# Patient Record
Sex: Female | Born: 1955 | Race: Black or African American | Hispanic: No | Marital: Single | Smoking: Current every day smoker
Health system: Southern US, Community
[De-identification: ages and names within clinical notes are randomized; demographics above are authoritative.]

## PROBLEM LIST (undated history)

## (undated) DIAGNOSIS — I10 Essential (primary) hypertension: Secondary | ICD-10-CM

## (undated) DIAGNOSIS — F419 Anxiety disorder, unspecified: Secondary | ICD-10-CM

## (undated) DIAGNOSIS — E785 Hyperlipidemia, unspecified: Secondary | ICD-10-CM

## (undated) DIAGNOSIS — R569 Unspecified convulsions: Secondary | ICD-10-CM

## (undated) DIAGNOSIS — K219 Gastro-esophageal reflux disease without esophagitis: Secondary | ICD-10-CM

## (undated) DIAGNOSIS — Z972 Presence of dental prosthetic device (complete) (partial): Secondary | ICD-10-CM

## (undated) HISTORY — DX: Hyperlipidemia, unspecified: E78.5

## (undated) HISTORY — PX: TUBAL LIGATION: SHX77

## (undated) HISTORY — PX: TOOTH EXTRACTION: SUR596

---

## 1999-05-23 LAB — HM PAP SMEAR

## 2003-11-10 HISTORY — PX: ABDOMINAL HYSTERECTOMY: SHX81

## 2005-07-31 ENCOUNTER — Emergency Department: Payer: Self-pay | Admitting: Emergency Medicine

## 2006-03-04 ENCOUNTER — Emergency Department: Payer: Self-pay | Admitting: Emergency Medicine

## 2006-03-24 ENCOUNTER — Other Ambulatory Visit: Payer: Self-pay

## 2006-03-29 ENCOUNTER — Ambulatory Visit: Payer: Self-pay | Admitting: Obstetrics and Gynecology

## 2007-07-26 LAB — HM MAMMOGRAPHY

## 2008-07-25 ENCOUNTER — Other Ambulatory Visit: Payer: Self-pay

## 2008-07-25 ENCOUNTER — Emergency Department: Payer: Self-pay | Admitting: Emergency Medicine

## 2009-05-02 ENCOUNTER — Ambulatory Visit: Payer: Self-pay | Admitting: Family Medicine

## 2010-07-10 HISTORY — PX: LAMINECTOMY: SHX219

## 2010-08-07 ENCOUNTER — Ambulatory Visit (HOSPITAL_COMMUNITY)
Admission: RE | Admit: 2010-08-07 | Discharge: 2010-08-09 | Payer: Self-pay | Source: Home / Self Care | Admitting: Orthopedic Surgery

## 2010-08-07 LAB — HM HEPATITIS C SCREENING LAB: HM HEPATITIS C SCREENING: NEGATIVE

## 2010-08-07 LAB — HM HIV SCREENING LAB: HM HIV Screening: NEGATIVE

## 2011-01-22 LAB — BASIC METABOLIC PANEL WITH GFR
BUN: 7 mg/dL (ref 6–23)
CO2: 29 meq/L (ref 19–32)
Calcium: 9.7 mg/dL (ref 8.4–10.5)
Chloride: 105 meq/L (ref 96–112)
Creatinine, Ser: 0.85 mg/dL (ref 0.4–1.2)
GFR calc non Af Amer: >60 mL/min
Glucose, Bld: 100 mg/dL — ABNORMAL HIGH (ref 70–99)
Potassium: 3.9 meq/L (ref 3.5–5.1)
Sodium: 140 meq/L (ref 135–145)

## 2011-01-22 LAB — CBC
HCT: 38.9 % (ref 36.0–46.0)
Hemoglobin: 12.7 g/dL (ref 12.0–15.0)
MCHC: 32.6 g/dL (ref 30.0–36.0)
RBC: 4.15 MIL/uL (ref 3.87–5.11)
WBC: 5.2 10*3/uL (ref 4.0–10.5)

## 2011-01-22 LAB — SURGICAL PCR SCREEN: Staphylococcus aureus: NEGATIVE

## 2011-01-22 LAB — HEPATIC FUNCTION PANEL
AST: 37 U/L (ref 0–37)
Albumin: 3.9 g/dL (ref 3.5–5.2)
Alkaline Phosphatase: 71 U/L (ref 39–117)
Total Bilirubin: 0.9 mg/dL (ref 0.3–1.2)
Total Protein: 6.9 g/dL (ref 6.0–8.3)

## 2011-04-30 LAB — CBC AND DIFFERENTIAL
HEMATOCRIT: 40 % (ref 36–46)
HEMOGLOBIN: 13.1 g/dL (ref 12.0–16.0)
NEUTROS ABS: 2 /uL
PLATELETS: 313 10*3/uL (ref 150–399)
WBC: 5 10*3/mL

## 2011-04-30 LAB — HEPATIC FUNCTION PANEL
ALK PHOS: 89 U/L (ref 25–125)
ALT: 27 U/L (ref 7–35)
AST: 33 U/L (ref 13–35)
Bilirubin, Total: 0.3 mg/dL

## 2011-04-30 LAB — BASIC METABOLIC PANEL
BUN: 9 mg/dL (ref 4–21)
Creatinine: 1 mg/dL (ref 0.5–1.1)
Glucose: 88 mg/dL
POTASSIUM: 4 mmol/L (ref 3.4–5.3)
Sodium: 139 mmol/L (ref 137–147)

## 2011-04-30 LAB — LIPID PANEL
CHOLESTEROL: 191 mg/dL (ref 0–200)
HDL: 63 mg/dL (ref 35–70)
LDL Cholesterol: 102 mg/dL
LDl/HDL Ratio: 1.6
TRIGLYCERIDES: 132 mg/dL (ref 40–160)

## 2011-04-30 LAB — TSH: TSH: 3.49 u[IU]/mL (ref 0.41–5.90)

## 2012-03-25 ENCOUNTER — Emergency Department: Payer: Self-pay | Admitting: *Deleted

## 2015-07-18 ENCOUNTER — Other Ambulatory Visit: Payer: Self-pay | Admitting: Family Medicine

## 2015-07-18 DIAGNOSIS — I10 Essential (primary) hypertension: Secondary | ICD-10-CM

## 2015-07-19 NOTE — Telephone Encounter (Signed)
Last Office Visit was on 10/31/2011   Her atenolol was refilled on 12/10/2014. Do you want her to get an appointment to f/u?      Thanks,

## 2015-08-15 ENCOUNTER — Other Ambulatory Visit: Payer: Self-pay | Admitting: Family Medicine

## 2015-08-15 DIAGNOSIS — I10 Essential (primary) hypertension: Secondary | ICD-10-CM

## 2015-08-23 ENCOUNTER — Other Ambulatory Visit: Payer: Self-pay | Admitting: Family Medicine

## 2015-08-23 DIAGNOSIS — I10 Essential (primary) hypertension: Secondary | ICD-10-CM

## 2015-08-23 MED ORDER — TRIAMTERENE-HCTZ 37.5-25 MG PO TABS: 1.0000 | ORAL_TABLET | Freq: Every day | ORAL | Status: DC

## 2015-08-23 NOTE — Telephone Encounter (Signed)
Pt stating she is out of her medication triamterene-hydrochlorothiazide (MAXZIDE-25) 37.5-25 MG per tablet  @ [facility] on [address].Pt has an office visit on Monday for BP Check. CC

## 2015-08-26 ENCOUNTER — Ambulatory Visit: Payer: Self-pay | Admitting: Family Medicine

## 2015-09-02 ENCOUNTER — Ambulatory Visit (INDEPENDENT_AMBULATORY_CARE_PROVIDER_SITE_OTHER): Payer: PPO | Admitting: Family Medicine

## 2015-09-02 ENCOUNTER — Encounter: Payer: Self-pay | Admitting: Emergency Medicine

## 2015-09-02 ENCOUNTER — Encounter: Payer: Self-pay | Admitting: Family Medicine

## 2015-09-02 VITALS — BP 124/82 | HR 70 | Temp 97.8°F | Resp 16 | Wt 218.0 lb

## 2015-09-02 DIAGNOSIS — I10 Essential (primary) hypertension: Secondary | ICD-10-CM | POA: Diagnosis not present

## 2015-09-02 DIAGNOSIS — Z720 Tobacco use: Secondary | ICD-10-CM | POA: Insufficient documentation

## 2015-09-02 DIAGNOSIS — F419 Anxiety disorder, unspecified: Secondary | ICD-10-CM

## 2015-09-02 DIAGNOSIS — G509 Disorder of trigeminal nerve, unspecified: Secondary | ICD-10-CM | POA: Insufficient documentation

## 2015-09-02 DIAGNOSIS — F17200 Nicotine dependence, unspecified, uncomplicated: Secondary | ICD-10-CM

## 2015-09-02 DIAGNOSIS — J309 Allergic rhinitis, unspecified: Secondary | ICD-10-CM | POA: Insufficient documentation

## 2015-09-02 DIAGNOSIS — K219 Gastro-esophageal reflux disease without esophagitis: Secondary | ICD-10-CM | POA: Insufficient documentation

## 2015-09-02 DIAGNOSIS — F1911 Other psychoactive substance abuse, in remission: Secondary | ICD-10-CM | POA: Insufficient documentation

## 2015-09-02 MED ORDER — TRIAMTERENE-HCTZ 75-50 MG PO TABS
1.0000 | ORAL_TABLET | Freq: Every day | ORAL | Status: DC
Start: 2015-09-02 — End: 2016-02-24

## 2015-09-02 MED ORDER — ESCITALOPRAM OXALATE 10 MG PO TABS: 10.0000 mg | ORAL_TABLET | Freq: Every day | ORAL | Status: DC

## 2015-09-02 NOTE — Progress Notes (Signed)
Patient ID: Elizabeth Hogan, female   DOB: 08-29-1956, 59 y.o.   MRN: [id number]       Patient: Elizabeth Hogan Female    DOB: Jan 08, 1956   59 y.o.   MRN: [id number] Visit Date: 09/02/2015  Today's Provider: Margarita Rana, MD   Chief Complaint  Patient presents with  . Hypertension   Subjective:    HPI   Hypertension, follow-up:  BP Readings from Last 3 Encounters:  09/02/15 124/82    She was last seen for hypertension years ago.  BP at that visit was 130/82. Management since that visit includes none. She reports fair compliance with treatment. She is not having side effects.  She is exercising sometimes.   Outside blood pressures are being checked every 3 months when she goes to her " back doctor". Was up a few times when she did not take her pain medication, but usually it is ok.   Patient denies chest pain, chest pressure/discomfort, dyspnea, fatigue and palpitations.   Cardiovascular risk factors include hypertension and obesity (BMI >= 30 kg/m2).   Wt Readings from Last 3 Encounters:  09/02/15 218 lb (98.884 kg)    ------------------------------------------------------------------------ Pt reports that since she is not working and she thinks that is contributing to her feeling down most days. Having anxiety attacks. Has been going on a while.  Has not been treated for this before. See screening below.  Does smoke. Stays with her grand-daughter and her 3 kids. Would like something for anxiety. Has not been on medication for this in the past.     Also thinks the fluid pills are not doing enough. Diet has gone crazy. More salt than she should.    Depression screen [facility] 2/9 09/02/2015 09/02/2015  Decreased Interest 2 -  Down, Depressed, Hopeless 3 3  PHQ - 2 Score 5 3  Altered sleeping 1 -  Tired, decreased energy 0 -  Change in appetite 2 -  Feeling bad or failure about yourself  0 -  Trouble concentrating 0 -  Moving slowly or fidgety/restless 1 -  Suicidal  thoughts 0 -  PHQ-9 Score 9 -         Allergies  Allergen Reactions  . Codeine Sulfate Itching and Rash   Previous Medications   ATENOLOL (TENORMIN) 25 MG TABLET    TAKE ONE TABLET BY MOUTH ONCE DAILY   CELECOXIB (CELEBREX) 100 MG CAPSULE    Take by mouth.   GABAPENTIN (NEURONTIN) 300 MG CAPSULE    Take by mouth.   NORTRIPTYLINE (PAMELOR) 50 MG CAPSULE    Take by mouth.   OMEPRAZOLE POWD    Use.   RIZATRIPTAN (MAXALT) 5 MG TABLET    Take by mouth.   TRIAMTERENE-HYDROCHLOROTHIAZIDE (MAXZIDE-25) 37.5-25 MG TABLET    Take 1 tablet by mouth daily.    Review of Systems  Constitutional: Negative.   HENT: Negative.   Eyes: Negative.   Respiratory: Negative.   Cardiovascular: Negative.   Gastrointestinal: Negative.   Endocrine: Negative.   Genitourinary: Negative.   Musculoskeletal: Negative.   Skin: Negative.   Allergic/Immunologic: Negative.   Hematological: Negative.   Psychiatric/Behavioral: The patient is nervous/anxious.     Social History  Substance Use Topics  . Smoking status: Former Smoker -- 0.25 packs/day for 30 years  . Smokeless tobacco: Not on file  . Alcohol Use: Yes     Comment: 2-3 beers 2-3 times a week.   Objective:   BP 124/82 mmHg  Pulse 70  Temp(Src) 97.8 F (36.6 C) (Oral)  Resp 16  Wt 218 lb (98.884 kg)  Physical Exam  Constitutional: She is oriented to person, place, and time. She appears well-developed and well-nourished.  Cardiovascular: Normal rate and regular rhythm.   Pulmonary/Chest: Effort normal and breath sounds normal. No respiratory distress.  Neurological: She is alert and oriented to person, place, and time.  Psychiatric: She has a normal mood and affect. Her behavior is normal. Judgment and thought content normal.      Assessment & Plan:     1. Essential (primary) hypertension Stable, but is complaining of fluid retention. Will increase fluid pill and decrease salt intake and check labs.  - CBC w/Diff/Platelet -  Comprehensive metabolic panel - triamterene-hydrochlorothiazide (MAXZIDE) 75-50 MG tablet; Take 1 tablet by mouth daily.  Dispense: 30 tablet; Refill: 5  2. Anxiety New problem. Will check labs and start medication.   - escitalopram (LEXAPRO) 10 MG tablet; Take 1 tablet (10 mg total) by mouth daily.  Dispense: 30 tablet; Refill: 5 - TSH  3. Tobacco use disorder Stressed importance of quitting.  Margarita Rana, MD        Margarita Rana, MD  [facility]

## 2015-10-07 ENCOUNTER — Encounter: Payer: Self-pay | Admitting: Family Medicine

## 2015-10-07 ENCOUNTER — Ambulatory Visit (INDEPENDENT_AMBULATORY_CARE_PROVIDER_SITE_OTHER): Payer: PPO | Admitting: Family Medicine

## 2015-10-07 VITALS — BP 160/98 | HR 80 | Temp 98.1°F | Resp 16 | Wt 221.0 lb

## 2015-10-07 DIAGNOSIS — I10 Essential (primary) hypertension: Secondary | ICD-10-CM

## 2015-10-07 DIAGNOSIS — F419 Anxiety disorder, unspecified: Secondary | ICD-10-CM | POA: Diagnosis not present

## 2015-10-07 DIAGNOSIS — F17200 Nicotine dependence, unspecified, uncomplicated: Secondary | ICD-10-CM

## 2015-10-07 MED ORDER — AMLODIPINE BESYLATE 5 MG PO TABS: 5.0000 mg | ORAL_TABLET | Freq: Every day | ORAL | Status: DC

## 2015-10-07 MED ORDER — SERTRALINE HCL 50 MG PO TABS: 50.0000 mg | ORAL_TABLET | Freq: Every day | ORAL | Status: DC

## 2015-10-07 NOTE — Progress Notes (Signed)
Subjective:    Patient ID: Elizabeth Hogan, female    DOB: Mar 22, 1956, 59 y.o.   MRN: [id number]  Anxiety Presents for follow-up (started Lexapro at [place].) visit. Symptoms include decreased concentration, depressed mood, dizziness ("off-balance"), dry mouth, irritability, nervous/anxious behavior and palpitations. Patient reports no chest pain, compulsions, confusion ("I fell kind of off"), excessive worry, feeling of choking, hyperventilation, insomnia, muscle tension, nausea, panic, restlessness, shortness of breath or suicidal ideas. Symptoms occur most days.   Past treatments include SSRIs. The treatment provided moderate (there are certain side-effects to the Lexapro that pot is experiencing) relief. Compliance with prior treatments has been good. Side effects of treatment include headaches and visual problems.  Hypertension This is a chronic problem. Episode onset: FU- increased Maxzide at [facility] for swelling. The problem is uncontrolled. Associated symptoms include anxiety and palpitations. Pertinent negatives include no chest pain or shortness of breath. Treatments tried: Atenolol, Maxzide. The current treatment provides mild (Headache is making her head hurt. ) improvement. There are no compliance problems.    Could not have lab work done.  Has an old worker's comp issue.     Review of Systems  Constitutional: Positive for irritability.  Respiratory: Negative for shortness of breath.   Cardiovascular: Positive for palpitations. Negative for chest pain.  Gastrointestinal: Negative for nausea.  Neurological: Positive for dizziness ("off-balance").  Psychiatric/Behavioral: Positive for decreased concentration. Negative for suicidal ideas and confusion ("I fell kind of off"). The patient is nervous/anxious. The patient does not have insomnia.    BP 160/98 mmHg  Pulse 80  Temp(Src) 98.1 F (36.7 C) (Oral)  Resp 16  Wt 221 lb (100.245 kg)   Patient Active Problem List   Diagnosis Date  Noted  . Allergic rhinitis 09/02/2015  . Acid reflux 09/02/2015  . 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 Tobacco use disorder 09/02/2015  . 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 Anxiety 09/02/2015  . Essential (primary) hypertension 08/04/2005   No past medical history on file. Current Outpatient Prescriptions on File Prior to Visit  Medication Sig  . atenolol (TENORMIN) 25 MG tablet TAKE ONE TABLET BY MOUTH ONCE DAILY  . celecoxib (CELEBREX) 100 MG capsule Take by mouth.  . escitalopram (LEXAPRO) 10 MG tablet Take 1 tablet (10 mg total) by mouth daily.  Marland Kitchen gabapentin (NEURONTIN) 300 MG capsule Take by mouth.  . nortriptyline (PAMELOR) 50 MG capsule Take by mouth.  . Omeprazole POWD Use.  . rizatriptan (MAXALT) 5 MG tablet Take by mouth.  . triamterene-hydrochlorothiazide (MAXZIDE) 75-50 MG tablet Take 1 tablet by mouth daily.   No current facility-administered medications on file prior to visit.   Allergies  Allergen Reactions  . Codeine Sulfate Itching and Rash   Past Surgical History  Procedure Laterality Date  . Abdominal hysterectomy  2005  . Laminectomy  07/2010    Secondary to Job injury in 2010, Subsequent nerve damage and followed by [place] History   Social History  . Marital Status: Single    Spouse Name: N/A  . Number of Children: N/A  . Years of Education: N/A   Occupational History  . Not on file.   Social History Main Topics  . Smoking status: Former Smoker -- 0.25 packs/day for 30 years  . Smokeless tobacco: Not on file  . Alcohol Use: Yes     Comment: 2-3 beers 2-3 times a week.  . Drug Use: Yes    Special: Cocaine  Comment: Used cocaine in the past and been incarcerated for this.  . Sexual Activity: Not on file   Other Topics Concern  . Not on file   Social History Narrative   Family History  Problem Relation Age of Onset  . Hypertension Mother   . Arthritis Mother   . Diabetes Father   . Alzheimer's  disease Father   . Hypertension Father   . Hypertension Brother       Objective:   Physical Exam  Constitutional: She is oriented to person, place, and time. She appears well-developed and well-nourished.  Cardiovascular: Normal rate and regular rhythm.   Pulmonary/Chest: Effort normal and breath sounds normal.  Neurological: She is alert and oriented to person, place, and time.  Psychiatric: She has a normal mood and affect. Her behavior is normal. Judgment and thought content normal.   BP 160/98 mmHg  Pulse 80  Temp(Src) 98.1 F (36.7 C) (Oral)  Resp 16  Wt 221 lb (100.245 kg)      Assessment & Plan:  1. Essential (primary) hypertension Improved, but not at goal.  Will add medication and recheck at [facility].  - amLODipine (NORVASC) 5 MG tablet; Take 1 tablet (5 mg total) by mouth daily.  Dispense: 90 tablet; Refill: 3  2. Anxiety Will change to Zoloft.   Recheck at CPE.  Will call in 4 weeks and can make further adjustment over the phone.  - sertraline (ZOLOFT) 50 MG tablet; Take 1 tablet (50 mg total) by mouth daily. 1/2  For one week and then increase to one pill.  Dispense: 30 tablet; Refill: 3  3. Tobacco use disorder Still smoking, but does not do any other drugs at this time.    Margarita Rana, MD

## 2015-10-09 ENCOUNTER — Emergency Department
Admission: EM | Admit: 2015-10-09 | Discharge: 2015-10-09 | Disposition: A | Discharge status: Home / Self Care | Payer: PPO | Attending: Emergency Medicine | Admitting: Emergency Medicine

## 2015-10-09 ENCOUNTER — Encounter: Payer: Self-pay | Admitting: Emergency Medicine

## 2015-10-09 DIAGNOSIS — F17210 Nicotine dependence, cigarettes, uncomplicated: Secondary | ICD-10-CM | POA: Insufficient documentation

## 2015-10-09 DIAGNOSIS — I10 Essential (primary) hypertension: Secondary | ICD-10-CM | POA: Diagnosis not present

## 2015-10-09 DIAGNOSIS — R519 Headache, unspecified: Secondary | ICD-10-CM

## 2015-10-09 DIAGNOSIS — R51 Headache: Secondary | ICD-10-CM | POA: Diagnosis present

## 2015-10-09 DIAGNOSIS — F419 Anxiety disorder, unspecified: Secondary | ICD-10-CM | POA: Insufficient documentation

## 2015-10-09 HISTORY — DX: Anxiety disorder, unspecified: F41.9

## 2015-10-09 HISTORY — DX: Essential (primary) hypertension: I10

## 2015-10-09 MED ORDER — OXYCODONE-ACETAMINOPHEN 5-325 MG PO TABS
2.0000 | ORAL_TABLET | Freq: Once | ORAL | Status: AC
  Administered 2015-10-09: 2 via ORAL
  Filled 2015-10-09: qty 2

## 2015-10-09 MED ORDER — LORAZEPAM 1 MG PO TABS: 1.0000 mg | ORAL_TABLET | Freq: Two times a day (BID) | ORAL | Status: AC

## 2015-10-09 NOTE — Discharge Instructions (Signed)
General Headache Without Cause °A headache is pain or discomfort felt around the head or neck area. The specific cause of a headache may not be found. There are many causes and types of headaches. A few common ones are: °· Tension headaches. °· Migraine headaches. °· Cluster headaches. °· Chronic daily headaches. °HOME CARE INSTRUCTIONS  °Watch your condition for any changes. Take these steps to help with your condition: °Managing Pain °· Take over-the-counter and prescription medicines only as told by your health care provider. °· Lie down in a dark, quiet room when you have a headache. °· If directed, apply ice to the head and neck area: °· Put ice in a plastic bag. °· Place a towel between your skin and the bag. °· Leave the ice on for 20 minutes, 2-3 times per day. °· Use a heating pad or hot shower to apply heat to the head and neck area as told by your health care provider. °· Keep lights dim if bright lights bother you or make your headaches worse. °Eating and Drinking °· Eat meals on a regular schedule. °· Limit alcohol use. °· Decrease the amount of caffeine you drink, or stop drinking caffeine. °General Instructions °· Keep all follow-up visits as told by your health care provider. This is important. °· Keep a headache journal to help find out what may trigger your headaches. For example, write down: °· What you eat and drink. °· How much sleep you get. °· Any change to your diet or medicines. °· Try massage or other relaxation techniques. °· Limit stress. °· Sit up straight, and do not tense your muscles. °· Do not use tobacco products, including cigarettes, chewing tobacco, or e-cigarettes. If you need help quitting, ask your health care provider. °· Exercise regularly as told by your health care provider. °· Sleep on a regular schedule. Get 7-9 hours of sleep, or the amount recommended by your health care provider. °SEEK MEDICAL CARE IF:  °· Your symptoms are not helped by medicine. °· You have a  headache that is different from the usual headache. °· You have nausea or you vomit. °· You have a fever. °SEEK IMMEDIATE MEDICAL CARE IF:  °· Your headache becomes severe. °· You have repeated vomiting. °· You have a stiff neck. °· You have a loss of vision. °· You have problems with speech. °· You have pain in the eye or ear. °· You have muscular weakness or loss of muscle control. °· You lose your balance or have trouble walking. °· You feel faint or pass out. °· You have confusion. °  °This information is not intended to replace advice given to you by your health care provider. Make sure you discuss any questions you have with your health care provider. °  °Document Released: 10/26/2005 Document Revised: 07/17/2015 Document Reviewed: 02/18/2015 °Elsevier Interactive Patient Education ©2016 Elsevier Inc. ° °Hypertension °Hypertension, commonly called high blood pressure, is when the force of blood pumping through your arteries is too strong. Your arteries are the blood vessels that carry blood from your heart throughout your body. A blood pressure reading consists of a higher number over a lower number, such as 110/72. The higher number (systolic) is the pressure inside your arteries when your heart pumps. The lower number (diastolic) is the pressure inside your arteries when your heart relaxes. Ideally you want your blood pressure below 120/80. °Hypertension forces your heart to work harder to pump blood. Your arteries may become narrow or stiff. Having untreated or   uncontrolled hypertension can cause heart attack, stroke, kidney disease, and other problems. °RISK FACTORS °Some risk factors for high blood pressure are controllable. Others are not.  °Risk factors you cannot control include:  °· Race. You may be at higher risk if you are African American. °· Age. Risk increases with age. °· Gender. Men are at higher risk than women before age 45 years. After age 65, women are at higher risk than men. °Risk factors  you can control include: °· Not getting enough exercise or physical activity. °· Being overweight. °· Getting too much fat, sugar, calories, or salt in your diet. °· Drinking too much alcohol. °SIGNS AND SYMPTOMS °Hypertension does not usually cause signs or symptoms. Extremely high blood pressure (hypertensive crisis) may cause headache, anxiety, shortness of breath, and nosebleed. °DIAGNOSIS °To check if you have hypertension, your health care provider will measure your blood pressure while you are seated, with your arm held at the level of your heart. It should be measured at least twice using the same arm. Certain conditions can cause a difference in blood pressure between your right and left arms. A blood pressure reading that is higher than normal on one occasion does not mean that you need treatment. If it is not clear whether you have high blood pressure, you may be asked to return on a different day to have your blood pressure checked again. Or, you may be asked to monitor your blood pressure at home for 1 or more weeks. °TREATMENT °Treating high blood pressure includes making lifestyle changes and possibly taking medicine. Living a healthy lifestyle can help lower high blood pressure. You may need to change some of your habits. °Lifestyle changes may include: °· Following the DASH diet. This diet is high in fruits, vegetables, and whole grains. It is low in salt, red meat, and added sugars. °· Keep your sodium intake below 2,300 mg per day. °· Getting at least 30-45 minutes of aerobic exercise at least 4 times per week. °· Losing weight if necessary. °· Not smoking. °· Limiting alcoholic beverages. °· Learning ways to reduce stress. °Your health care provider may prescribe medicine if lifestyle changes are not enough to get your blood pressure under control, and if one of the following is true: °· You are 18-59 years of age and your systolic blood pressure is above 140. °· You are 60 years of age or older,  and your systolic blood pressure is above 150. °· Your diastolic blood pressure is above 90. °· You have diabetes, and your systolic blood pressure is over 140 or your diastolic blood pressure is over 90. °· You have kidney disease and your blood pressure is above 140/90. °· You have heart disease and your blood pressure is above 140/90. °Your personal target blood pressure may vary depending on your medical conditions, your age, and other factors. °HOME CARE INSTRUCTIONS °· Have your blood pressure rechecked as directed by your health care provider.   °· Take medicines only as directed by your health care provider. Follow the directions carefully. Blood pressure medicines must be taken as prescribed. The medicine does not work as well when you skip doses. Skipping doses also puts you at risk for problems. °· Do not smoke.   °· Monitor your blood pressure at home as directed by your health care provider.  °SEEK MEDICAL CARE IF:  °· You think you are having a reaction to medicines taken. °· You have recurrent headaches or feel dizzy. °· You have swelling in your   ankles. °· You have trouble with your vision. °SEEK IMMEDIATE MEDICAL CARE IF: °· You develop a severe headache or confusion. °· You have unusual weakness, numbness, or feel faint. °· You have severe chest or abdominal pain. °· You vomit repeatedly. °· You have trouble breathing. °MAKE SURE YOU:  °· Understand these instructions. °· Will watch your condition. °· Will get help right away if you are not doing well or get worse. °  °This information is not intended to replace advice given to you by your health care provider. Make sure you discuss any questions you have with your health care provider. °  °Document Released: 10/26/2005 Document Revised: 03/12/2015 Document Reviewed: 08/18/2013 °Elsevier Interactive Patient Education ©2016 Elsevier Inc. ° °

## 2015-10-09 NOTE — ED Provider Notes (Signed)
[facility] Emergency Department Provider Note     Time seen: ----------------------------------------- 10:18 PM on 10/09/2015 -----------------------------------------    I have reviewed the triage vital signs and the nursing notes.   HISTORY  Chief Complaint Headache    HPI Elizabeth Hogan is a 59 y.o. female who presents ER for headache. Patient claims her pressure in the front of her head. Patient recently had her blood pressure medication increased her primary care doctor. She went to the pharmacy the pharmacist told her that a blood pressure of 140/95 to cause serious medical problems. Patient just complains of dental head pressure, denies fevers, chills, chest pain, shortness of breath, nausea vomiting or diarrhea. Patient also notes she has history of anxiety.   Past Medical History  Diagnosis Date  . Hypertension   . Anxiety     Patient Active Problem List   Diagnosis Date Noted  . Allergic rhinitis 09/02/2015  . Acid reflux 09/02/2015  . 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 Tobacco use disorder 09/02/2015  . 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 Anxiety 09/02/2015  . Essential (primary) hypertension 08/04/2005    Past Surgical History  Procedure Laterality Date  . Abdominal hysterectomy  2005  . Laminectomy  07/2010    Secondary to Job injury in 2010, Subsequent nerve damage and followed by [facility]    Allergies Codeine sulfate  Social History Social History  Substance Use Topics  . Smoking status: Current Every Day Smoker -- 0.50 packs/day for 30 years    Types: Cigarettes  . Smokeless tobacco: None  . Alcohol Use: Yes     Comment: 2-3 beers 2-3 times a week.    Review of Systems Constitutional: Negative for fever. Eyes: Negative for visual changes. ENT: Negative for sore throat. Cardiovascular: Negative for chest pain. Respiratory: Negative for shortness of breath. Gastrointestinal: Negative for  abdominal pain, vomiting and diarrhea. Genitourinary: Negative for dysuria. Musculoskeletal: Negative for back pain. Skin: Negative for rash. Neurological: Positive for headache, negative for weakness  10-point ROS otherwise negative.  ____________________________________________   PHYSICAL EXAM:  VITAL SIGNS: ED Triage Vitals  Enc Vitals Group     BP 10/09/15 2102 140/91 mmHg     Pulse Rate 10/09/15 2102 80     Resp 10/09/15 2102 20     Temp 10/09/15 2102 98 F (36.7 C)     Temp Source 10/09/15 2102 Oral     SpO2 10/09/15 2102 97 %     Weight 10/09/15 2102 221 lb (100.245 kg)     Height 10/09/15 2102 5\' 10"  (1.778 m)     Head Cir --      Peak Flow --      Pain Score 10/09/15 2059 3     Pain Loc --      Pain Edu? --      Excl. in [place]? --     Constitutional: Alert and oriented. Well appearing and in no distress. Eyes: Conjunctivae are normal. PERRL. Normal extraocular movements. ENT   Head: Normocephalic and atraumatic.   Nose: No congestion/rhinnorhea.   Mouth/Throat: Mucous membranes are moist.   Neck: No stridor. Cardiovascular: Normal rate, regular rhythm. Normal and symmetric distal pulses are present in all extremities. No murmurs, rubs, or gallops. Respiratory: Normal respiratory effort without tachypnea nor retractions. Breath sounds are clear and equal bilaterally. No wheezes/rales/rhonchi. Gastrointestinal: Soft and nontender. No distention. No abdominal bruits.  Musculoskeletal: Nontender with normal range of motion in all extremities. No  joint effusions.  No lower extremity tenderness nor edema. Neurologic:  Normal speech and language. No gross focal neurologic deficits are appreciated. Speech is normal. No gait instability. Skin:  Skin is warm, dry and intact. No rash noted. Psychiatric: Mildly anxious, speech and behavior are normal. ____________________________________________  ED COURSE:  Pertinent labs & imaging results that were available  during my care of the patient were reviewed by me and considered in my medical decision making (see chart for details). Patient is in no acute distress, she does need reassurance, will give oral pain medication here. Patient will need occasional anxiety medicine such as Ativan to take as needed. ____________________________________________  FINAL ASSESSMENT AND PLAN  Headache, hypertension, anxiety  Plan: Patient with labs and imaging as dictated above. I advised patient not to check her blood pressure over the next week. She will take Ativan as needed and continue her blood pressure medicines as recently adjusted by her primary care doctor.   Earleen Newport, MD   Earleen Newport, MD 10/09/15 [phone]

## 2015-10-09 NOTE — ED Notes (Signed)
Patient ambulatory to triage with steady gait, without difficulty or distress noted; pt reports frontal HA since Sunday; went Monday to Dr Venia Minks and BP med adjusted but st elevated BP140/95 noted this evening; pt also st has had some anxiety and had that med adjusted as well but HA persists

## 2015-10-09 NOTE — ED Notes (Signed)
Patient discharged to home per MD order. Patient in stable condition, and deemed medically cleared by ED provider for discharge. Discharge instructions reviewed with patient/family using "Teach Back"; verbalized understanding of medication education and administration, and information about follow-up care. Denies further concerns. ° °

## 2015-10-09 NOTE — ED Notes (Signed)
Pt in with co pressure to her head, states has had high blood pressure and pmd recently increased bp med Monday.  Denies any cp or shob, bp at home 140/95, no distress noted at this time.

## 2015-10-11 ENCOUNTER — Telehealth: Payer: Self-pay | Admitting: Family Medicine

## 2015-10-11 NOTE — Telephone Encounter (Signed)
Pt advised.   Thanks,   -Lorenia Hoston  

## 2015-10-11 NOTE — Telephone Encounter (Signed)
Pt was in on Monday and seen Dr. Venia Minks.  She prescribed her Norvasc 5mg .  She was taking Atenolol 25 mg.  Is she suppose to take both medications or discontinue the Atenolol.  Call back is [phone]  Thanks Con Memos

## 2015-10-11 NOTE — Telephone Encounter (Signed)
Take both. Thanks.

## 2015-10-16 ENCOUNTER — Other Ambulatory Visit: Payer: Self-pay

## 2015-10-16 DIAGNOSIS — E039 Hypothyroidism, unspecified: Secondary | ICD-10-CM

## 2015-10-16 MED ORDER — LEVOTHYROXINE SODIUM 75 MCG PO TABS: 75.0000 ug | ORAL_TABLET | Freq: Every day | ORAL | Status: DC

## 2015-11-19 ENCOUNTER — Other Ambulatory Visit: Payer: Self-pay | Admitting: Family Medicine

## 2015-11-19 DIAGNOSIS — I10 Essential (primary) hypertension: Secondary | ICD-10-CM

## 2015-12-02 ENCOUNTER — Encounter: Payer: Self-pay | Admitting: Family Medicine

## 2015-12-16 ENCOUNTER — Telehealth: Payer: Self-pay | Admitting: Family Medicine

## 2015-12-16 ENCOUNTER — Encounter: Payer: Self-pay | Admitting: Family Medicine

## 2015-12-16 ENCOUNTER — Ambulatory Visit (INDEPENDENT_AMBULATORY_CARE_PROVIDER_SITE_OTHER): Payer: PPO | Admitting: Family Medicine

## 2015-12-16 VITALS — BP 100/78 | HR 72 | Temp 98.3°F | Resp 16 | Ht 70.0 in | Wt 220.0 lb

## 2015-12-16 DIAGNOSIS — Z1239 Encounter for other screening for malignant neoplasm of breast: Secondary | ICD-10-CM

## 2015-12-16 DIAGNOSIS — Z114 Encounter for screening for human immunodeficiency virus [HIV]: Secondary | ICD-10-CM | POA: Diagnosis not present

## 2015-12-16 DIAGNOSIS — Z124 Encounter for screening for malignant neoplasm of cervix: Secondary | ICD-10-CM | POA: Diagnosis not present

## 2015-12-16 DIAGNOSIS — I10 Essential (primary) hypertension: Secondary | ICD-10-CM | POA: Diagnosis not present

## 2015-12-16 DIAGNOSIS — Z0000 Encounter for general adult medical examination without abnormal findings: Secondary | ICD-10-CM | POA: Diagnosis not present

## 2015-12-16 DIAGNOSIS — Z833 Family history of diabetes mellitus: Secondary | ICD-10-CM

## 2015-12-16 DIAGNOSIS — F419 Anxiety disorder, unspecified: Secondary | ICD-10-CM | POA: Diagnosis not present

## 2015-12-16 DIAGNOSIS — Z1159 Encounter for screening for other viral diseases: Secondary | ICD-10-CM | POA: Diagnosis not present

## 2015-12-16 DIAGNOSIS — R358 Other polyuria: Secondary | ICD-10-CM

## 2015-12-16 DIAGNOSIS — R3589 Other polyuria: Secondary | ICD-10-CM

## 2015-12-16 MED ORDER — SERTRALINE HCL 100 MG PO TABS: 50.0000 mg | ORAL_TABLET | Freq: Every day | ORAL | Status: DC

## 2015-12-16 MED ORDER — SERTRALINE HCL 100 MG PO TABS: 100.0000 mg | ORAL_TABLET | Freq: Every day | ORAL | Status: DC

## 2015-12-16 MED ORDER — AMLODIPINE BESYLATE 2.5 MG PO TABS: 5.0000 mg | ORAL_TABLET | Freq: Every day | ORAL | Status: DC

## 2015-12-16 MED ORDER — AMLODIPINE BESYLATE 2.5 MG PO TABS: 2.5000 mg | ORAL_TABLET | Freq: Every day | ORAL | Status: DC

## 2015-12-16 NOTE — Telephone Encounter (Signed)
[facility] called saying they have two Prescriptions for amlodipine with two different instructions.  Please contact [facility] and clarify.  Thanks, [employer]

## 2015-12-16 NOTE — Patient Instructions (Signed)
Please call the [facility] at [facility] to schedule this at [phone]   

## 2015-12-16 NOTE — Telephone Encounter (Signed)
Want patient to take 2.5. Thanks.

## 2015-12-16 NOTE — Telephone Encounter (Signed)
[facility] advised.   Thanks,   -Mickel Baas

## 2015-12-16 NOTE — Progress Notes (Signed)
Patient ID: Elizabeth Hogan, female   DOB: 1955-12-04, 60 y.o.   MRN: [id number]       Patient: Elizabeth Hogan, Female    DOB: Nov 04, 1956, 60 y.o.   MRN: [id number] Visit Date: 12/16/2015  Today's Provider: Margarita Rana, MD   Chief Complaint  Patient presents with  . Annual Exam   Subjective:    Annual physical exam Elizabeth Hogan is a 60 y.o. female who presents today for health maintenance and complete physical. She feels fairly well. She reports exercising none. She reports she is sleeping fairly well.  04/27/11 CPE 05/25/07 Mammo-BI-RADS 1 -----------------------------------------------------------------   Hypertension, follow-up:  BP Readings from Last 3 Encounters:  12/16/15 100/78  10/09/15 140/91  10/07/15 160/98    She was last seen for hypertension 2 months ago.  BP at that visit was 140/91. Management changes since that visit include starting amlodipine. She reports excellent compliance with treatment. She is not having side effects.  She is not exercising. She is not adherent to low salt diet.   Outside blood pressures are not being checked. She is experiencing none.  Patient denies chest pain.   Cardiovascular risk factors include smoking/ tobacco exposure.  Use of agents associated with hypertension: none.    Weight trend: stable Wt Readings from Last 3 Encounters:  12/16/15 220 lb (99.791 kg)  10/09/15 221 lb (100.245 kg)  10/07/15 221 lb (100.245 kg)    Current diet: in general, a "healthy" diet    ------------------------------------------------------------------------  Anxiety follow-up: Patient complains of anxiety disorder and sleep disturbance.  She has the following symptoms: insomnia. Onset of symptoms was approximately several years ago, gradually improving since that time. She denies current suicidal and homicidal ideation.   She complains of the following side effects from the treatment: none.   Review of Systems  Constitutional:  Positive for activity change and unexpected weight change.  HENT: Positive for dental problem.   Eyes: Positive for visual disturbance.  Respiratory: Negative.   Cardiovascular: Negative.   Gastrointestinal: Positive for diarrhea.  Endocrine: Negative.   Genitourinary: Negative.   Musculoskeletal: Positive for myalgias, back pain and arthralgias.  Skin: Negative.   Allergic/Immunologic: Positive for environmental allergies.  Neurological: Negative.   Hematological: Negative.   Psychiatric/Behavioral: Positive for agitation. The patient is nervous/anxious.     Social History      She  reports that she has been smoking Cigarettes.  She has a 15 pack-year smoking history. She has never used smokeless tobacco. She reports that she drinks alcohol. She reports that she does not use illicit drugs.       Social History   Social History  . Marital Status: Single    Spouse Name: N/A  . Number of Children: N/A  . Years of Education: N/A   Social History Main Topics  . Smoking status: Current Every Day Smoker -- 0.50 packs/day for 30 years    Types: Cigarettes  . Smokeless tobacco: Never Used  . Alcohol Use: Yes     Comment: 2-3 beers 2-3 times a week.  . Drug Use: No     Comment: Used cocaine in the past and been incarcerated for this.  . Sexual Activity: Not Asked   Other Topics Concern  . None   Social History Narrative    Past Medical History  Diagnosis Date  . Hypertension   . Anxiety      Patient Active Problem List   Diagnosis Date Noted  . Allergic rhinitis 09/02/2015  .  Acid reflux 09/02/2015  . 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 Tobacco use disorder 09/02/2015  . 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 Anxiety 09/02/2015  . Essential (primary) hypertension 08/04/2005    Past Surgical History  Procedure Laterality Date  . Abdominal hysterectomy  2005  . Laminectomy  07/2010    Secondary to Job injury in 2010, Subsequent nerve damage and  followed by [facility]    Family History        Family Status  Relation Status Death Age  . Mother Alive   . Father Deceased 29's  . Brother Deceased 60's    cause of death was kidney failure with attempted transplant  . Sister Alive   . Brother Alive         Her family history includes Alzheimer's disease in her father; Arthritis in her mother; Diabetes in her father; Gout in her mother; Hypertension in her brother, father, and mother.    Allergies  Allergen Reactions  . Codeine Sulfate Itching and Rash    Previous Medications   AMLODIPINE (NORVASC) 5 MG TABLET    Take 1 tablet (5 mg total) by mouth daily.   ATENOLOL (TENORMIN) 25 MG TABLET    TAKE ONE TABLET BY MOUTH ONCE DAILY   CELECOXIB (CELEBREX) 100 MG CAPSULE    Take 100 mg by mouth daily.    GABAPENTIN (NEURONTIN) 300 MG CAPSULE    Take 300 mg by mouth 3 (three) times daily.    LEVOTHYROXINE (SYNTHROID, LEVOTHROID) 75 MCG TABLET    Take 1 tablet (75 mcg total) by mouth daily.   LORAZEPAM (ATIVAN) 1 MG TABLET    Take 1 tablet (1 mg total) by mouth 2 (two) times daily.   NORTRIPTYLINE (PAMELOR) 50 MG CAPSULE    Take 50 mg by mouth at bedtime.    OMEPRAZOLE (PRILOSEC) 20 MG CAPSULE    Take 20 mg by mouth daily.   RIZATRIPTAN (MAXALT) 5 MG TABLET    Take 5 mg by mouth.    SERTRALINE (ZOLOFT) 50 MG TABLET    Take 1 tablet (50 mg total) by mouth daily. 1/2  For one week and then increase to one pill.   TRIAMTERENE-HYDROCHLOROTHIAZIDE (MAXZIDE) 75-50 MG TABLET    Take 1 tablet by mouth daily.    Patient Care Team: Margarita Rana, MD as PCP - General (Family Medicine)     Objective:   Vitals: BP 100/78 mmHg  Pulse 72  Temp(Src) 98.3 F (36.8 C) (Oral)  Resp 16  Ht 5\' 10"  (1.778 m)  Wt 220 lb (99.791 kg)  BMI 31.57 kg/m2  SpO2 95%   Physical Exam  Constitutional: She is oriented to person, place, and time. She appears well-developed and well-nourished.  HENT:  Head: Normocephalic and atraumatic.  Right Ear:  Tympanic membrane, external ear and ear canal normal.  Left Ear: Tympanic membrane, external ear and ear canal normal.  Nose: Nose normal.  Mouth/Throat: Uvula is midline, oropharynx is clear and moist and mucous membranes are normal.  Eyes: Conjunctivae, EOM and lids are normal. Pupils are equal, round, and reactive to light.  Neck: Trachea normal and normal range of motion. Neck supple. Carotid bruit is not present. No thyroid mass and no thyromegaly present.  Cardiovascular: Normal rate, regular rhythm and normal heart sounds.   Pulmonary/Chest: Effort normal and breath sounds normal.  Abdominal: Soft. Normal appearance and bowel sounds are normal. There is no hepatosplenomegaly. There is no tenderness.  Genitourinary: Vagina normal.  No breast swelling, tenderness or discharge.  Musculoskeletal: Normal range of motion.  Lymphadenopathy:    She has no cervical adenopathy.    She has no axillary adenopathy.  Neurological: She is alert and oriented to person, place, and time. She has normal strength. No cranial nerve deficit.  Skin: Skin is warm, dry and intact.  Psychiatric: She has a normal mood and affect. Her speech is normal and behavior is normal. Judgment and thought content normal. Cognition and memory are normal.     Depression Screen PHQ 2/9 Scores 12/16/2015 09/02/2015 09/02/2015  PHQ - 2 Score 5 5 3   PHQ- 9 Score 10 9 -      Assessment & Plan:     Routine Health Maintenance and Physical Exam  Exercise Activities and Dietary recommendations Goals    None      Immunization History  Administered Date(s) Administered  . Td 11/10/1991       1. Annual physical exam Stable. Patient advised to continue eating healthy and exercise daily.  2. Essential (primary) hypertension Stable. Patient started on Amlodipine 2.5 mg as below.  Decreased from 5 mg. Will check labs.   - CBC with Differential/Platelet - Comprehensive metabolic panel - Lipid Panel With LDL/HDL  Ratio - TSH - amLODipine (NORVASC) 2.5 MG tablet; Take 1 tablet (2.5 mg total) by mouth daily.  Dispense: 30 tablet; Refill: 5  3. Anxiety Not to goal. Patients medication increased to  on 100 mg as below. Recheck in 3 months.  - sertraline (ZOLOFT) 100 MG tablet; Take 1 tablet (100 mg total) by mouth daily.  Dispense: 30 tablet; Refill: 3  4. Cervical cancer screening F/U pending lab report. - Pap IG and Chlamydia/Gonococcus, NAA - Ambulatory referral to Gastroenterology  5. Breast cancer screening - MM DIGITAL SCREENING BILATERAL; Future  6. Polyuria - Hemoglobin A1c  7. Family history of diabetes mellitus - Hemoglobin A1c  8. Need for hepatitis C screening test - Hepatitis C antibody  9. Screening for HIV (human immunodeficiency virus) - HIV antibody (with reflex)    Patient seen and examined by Dr. Jerrell Belfast, and note scribed by Philbert Riser. Dimas, CMA.  I have reviewed the document for accuracy and completeness and I agree with above. Jerrell Belfast, MD   Margarita Rana, MD    --------------------------------------------------------------------

## 2015-12-16 NOTE — Telephone Encounter (Signed)
Rosser and advised as directed below.  Thanks,  Mackayla Mullins

## 2015-12-18 LAB — PAP IG AND CT-NG NAA
CHLAMYDIA, NUC. ACID AMP: NEGATIVE
GONOCOCCUS BY NUCLEIC ACID AMP: NEGATIVE
PAP SMEAR COMMENT: 0

## 2015-12-19 ENCOUNTER — Telehealth: Payer: Self-pay

## 2015-12-19 NOTE — Telephone Encounter (Signed)
-----   Message from Margarita Rana, MD sent at 12/18/2015  4:51 PM EST ----- Pap  Normal with normal GC and Chlamydia.   Please notify patient.

## 2015-12-19 NOTE — Telephone Encounter (Signed)
Patient advised as directed below. Patient verbalized understanding.  

## 2015-12-22 ENCOUNTER — Telehealth: Payer: Self-pay | Admitting: Gastroenterology

## 2015-12-22 NOTE — Telephone Encounter (Signed)
Gastroenterology Pre-Procedure Review  Request Date: 02-11-16 Requesting Physician: Dr.   PATIENT REVIEW QUESTIONS: The patient responded to the following health history questions as indicated:    1. Are you having any GI issues? no 2. Do you have a personal history of Polyps? no 3. Do you have a family history of Colon Cancer or Polyps? no 4. Diabetes Mellitus? no 5. Joint replacements in the past 12 months?no 6. Major health problems in the past 3 months?no 7. Any artificial heart valves, MVP, or defibrillator?no    MEDICATIONS & ALLERGIES:    Patient reports the following regarding taking any anticoagulation/antiplatelet therapy:   Plavix, Coumadin, Eliquis, Xarelto, Lovenox, Pradaxa, Brilinta, or Effient? no Aspirin? no  Patient confirms/reports the following medications:  Current Outpatient Prescriptions  Medication Sig Dispense Refill   amLODipine (NORVASC) 2.5 MG tablet Take 1 tablet (2.5 mg total) by mouth daily. 30 tablet 5   atenolol (TENORMIN) 25 MG tablet TAKE ONE TABLET BY MOUTH ONCE DAILY 30 tablet 5   celecoxib (CELEBREX) 100 MG capsule Take 100 mg by mouth daily.      gabapentin (NEURONTIN) 300 MG capsule Take 300 mg by mouth 3 (three) times daily.      levothyroxine (SYNTHROID, LEVOTHROID) 75 MCG tablet Take 1 tablet (75 mcg total) by mouth daily. 90 tablet 3   LORazepam (ATIVAN) 1 MG tablet Take 1 tablet (1 mg total) by mouth 2 (two) times daily. 20 tablet 0   nortriptyline (PAMELOR) 50 MG capsule Take 50 mg by mouth at bedtime.      omeprazole (PRILOSEC) 20 MG capsule Take 20 mg by mouth daily.     rizatriptan (MAXALT) 5 MG tablet Take 5 mg by mouth.      sertraline (ZOLOFT) 100 MG tablet Take 1 tablet (100 mg total) by mouth daily. 30 tablet 3   triamterene-hydrochlorothiazide (MAXZIDE) 75-50 MG tablet Take 1 tablet by mouth daily. 30 tablet 5   No current facility-administered medications for this visit.    Patient confirms/reports the following  allergies:  Allergies  Allergen Reactions   Codeine Sulfate Itching and Rash    No orders of the defined types were placed in this encounter.    AUTHORIZATION INFORMATION Primary Insurance: 1D#: Group #:  Secondary Insurance: 1D#: Group #:  SCHEDULE INFORMATION: Date: 02-11-16 Time: Location:[facility]

## 2015-12-23 ENCOUNTER — Other Ambulatory Visit: Payer: Self-pay

## 2015-12-26 ENCOUNTER — Telehealth: Payer: Self-pay

## 2015-12-26 DIAGNOSIS — F419 Anxiety disorder, unspecified: Secondary | ICD-10-CM

## 2015-12-26 MED ORDER — SERTRALINE HCL 50 MG PO TABS: 50.0000 mg | ORAL_TABLET | Freq: Every day | ORAL | Status: DC

## 2015-12-26 NOTE — Telephone Encounter (Signed)
Patient called and states that she saw Dr. Venia Minks for Feb 6th CPE and her Amlodipine was decreased to 2.5 mg and Zoloft increased to 100 mg. Patient states since the change she has been having palpations for 7 days now. These episodes last seconds and has head pressure, no other symptoms feels fine otherwise. She has not been checking her B/P and she stopped taking all of her medications except the b/p medication trying to figure out what is causing her symptoms. Per Dr. Venia Minks and patient was advised that she can not stop her medication cold [place] like that. Symptoms are probably coming from Zoloft and advised patient to decrease dose back to 50 mg. -aa

## 2016-02-10 ENCOUNTER — Encounter: Payer: Self-pay | Admitting: *Deleted

## 2016-02-10 ENCOUNTER — Other Ambulatory Visit: Payer: Self-pay

## 2016-02-10 DIAGNOSIS — Z1211 Encounter for screening for malignant neoplasm of colon: Secondary | ICD-10-CM

## 2016-02-10 MED ORDER — PEG 3350-KCL-NABCB-NACL-NASULF 236 G PO SOLR: 4000.0000 mL | Freq: Once | ORAL | Status: DC

## 2016-02-11 ENCOUNTER — Ambulatory Visit: Payer: PPO | Admitting: Anesthesiology

## 2016-02-11 ENCOUNTER — Encounter
Admission: RE | Disposition: A | Discharge status: Home / Self Care | Payer: Self-pay | Source: Ambulatory Visit | Attending: Gastroenterology

## 2016-02-11 ENCOUNTER — Ambulatory Visit
Admission: RE | Admit: 2016-02-11 | Discharge: 2016-02-11 | Disposition: A | Discharge status: Home / Self Care | Payer: PPO | Source: Ambulatory Visit | Attending: Gastroenterology | Admitting: Gastroenterology

## 2016-02-11 DIAGNOSIS — K640 First degree hemorrhoids: Secondary | ICD-10-CM | POA: Insufficient documentation

## 2016-02-11 DIAGNOSIS — Z8249 Family history of ischemic heart disease and other diseases of the circulatory system: Secondary | ICD-10-CM | POA: Diagnosis not present

## 2016-02-11 DIAGNOSIS — Z90710 Acquired absence of both cervix and uterus: Secondary | ICD-10-CM | POA: Diagnosis not present

## 2016-02-11 DIAGNOSIS — Z833 Family history of diabetes mellitus: Secondary | ICD-10-CM | POA: Diagnosis not present

## 2016-02-11 DIAGNOSIS — D123 Benign neoplasm of transverse colon: Secondary | ICD-10-CM | POA: Diagnosis not present

## 2016-02-11 DIAGNOSIS — Z885 Allergy status to narcotic agent status: Secondary | ICD-10-CM | POA: Diagnosis not present

## 2016-02-11 DIAGNOSIS — G588 Other specified mononeuropathies: Secondary | ICD-10-CM | POA: Diagnosis not present

## 2016-02-11 DIAGNOSIS — F419 Anxiety disorder, unspecified: Secondary | ICD-10-CM | POA: Diagnosis not present

## 2016-02-11 DIAGNOSIS — F17210 Nicotine dependence, cigarettes, uncomplicated: Secondary | ICD-10-CM | POA: Insufficient documentation

## 2016-02-11 DIAGNOSIS — D124 Benign neoplasm of descending colon: Secondary | ICD-10-CM | POA: Diagnosis not present

## 2016-02-11 DIAGNOSIS — Z8261 Family history of arthritis: Secondary | ICD-10-CM | POA: Insufficient documentation

## 2016-02-11 DIAGNOSIS — Z79899 Other long term (current) drug therapy: Secondary | ICD-10-CM | POA: Diagnosis not present

## 2016-02-11 DIAGNOSIS — I10 Essential (primary) hypertension: Secondary | ICD-10-CM | POA: Diagnosis not present

## 2016-02-11 DIAGNOSIS — Z1211 Encounter for screening for malignant neoplasm of colon: Secondary | ICD-10-CM | POA: Diagnosis not present

## 2016-02-11 DIAGNOSIS — K635 Polyp of colon: Secondary | ICD-10-CM | POA: Diagnosis not present

## 2016-02-11 DIAGNOSIS — Z820 Family history of epilepsy and other diseases of the nervous system: Secondary | ICD-10-CM | POA: Insufficient documentation

## 2016-02-11 DIAGNOSIS — Z716 Tobacco abuse counseling: Secondary | ICD-10-CM | POA: Insufficient documentation

## 2016-02-11 HISTORY — PX: COLONOSCOPY WITH PROPOFOL: SHX5780

## 2016-02-11 SURGERY — COLONOSCOPY WITH PROPOFOL
Anesthesia: General

## 2016-02-11 MED ORDER — PROPOFOL 500 MG/50ML IV EMUL
INTRAVENOUS | Status: DC | PRN
  Administered 2016-02-11: 150 ug/kg/min via INTRAVENOUS

## 2016-02-11 MED ORDER — SODIUM CHLORIDE 0.9 % IV SOLN
INTRAVENOUS | Status: DC
Start: 2016-02-11 — End: 2016-02-11
  Administered 2016-02-11: 1000 mL via INTRAVENOUS

## 2016-02-11 MED ORDER — PROPOFOL 10 MG/ML IV BOLUS
INTRAVENOUS | Status: DC | PRN
  Administered 2016-02-11: 50 mg via INTRAVENOUS
  Administered 2016-02-11: 20 mg via INTRAVENOUS

## 2016-02-11 MED ORDER — LIDOCAINE HCL (PF) 2 % IJ SOLN
INTRAMUSCULAR | Status: DC | PRN
  Administered 2016-02-11: 60 mg via INTRADERMAL

## 2016-02-11 NOTE — H&P (Signed)
[facility]  [address] [place], [place] [address] Phone: [phone] Fax : [phone]  Primary Care Physician:  Margarita Rana, MD Primary Gastroenterologist:  Dr. Allen Norris  Pre-Procedure History & Physical: HPI:  Elizabeth Hogan is a 60 y.o. female is here for a screening colonoscopy.   Past Medical History  Diagnosis Date  . Hypertension   . Anxiety     Past Surgical History  Procedure Laterality Date  . Laminectomy  07/2010    Secondary to Job injury in 2010, Subsequent nerve damage and followed by [facility]  . Abdominal hysterectomy  2005    still has cervix  . Tubal ligation      Prior to Admission medications   Medication Sig Start Date End Date Taking? Authorizing Provider  amLODipine (NORVASC) 2.5 MG tablet Take 1 tablet (2.5 mg total) by mouth daily. 12/16/15   Margarita Rana, MD  atenolol (TENORMIN) 25 MG tablet TAKE ONE TABLET BY MOUTH ONCE DAILY Patient not taking: Reported on 02/11/2016 07/19/15   Margarita Rana, MD  celecoxib (CELEBREX) 100 MG capsule Take 100 mg by mouth daily.     Historical Provider, MD  gabapentin (NEURONTIN) 300 MG capsule Take 300 mg by mouth 3 (three) times daily.  10/31/11   Historical Provider, MD  levothyroxine (SYNTHROID, LEVOTHROID) 75 MCG tablet Take 1 tablet (75 mcg total) by mouth daily. 10/16/15   Margarita Rana, MD  LORazepam (ATIVAN) 1 MG tablet Take 1 tablet (1 mg total) by mouth 2 (two) times daily. 10/09/15 10/08/16  Earleen Newport, MD  nortriptyline (PAMELOR) 50 MG capsule Take 50 mg by mouth at bedtime.     Historical Provider, MD  omeprazole (PRILOSEC) 20 MG capsule Take 20 mg by mouth daily.    Historical Provider, MD  polyethylene glycol (GOLYTELY) 236 g solution Take 4,000 mLs by mouth once. Drink one 8 oz glass every 30 mins until stools are clear. 02/10/16   Lucilla Lame, MD  rizatriptan (MAXALT) 5 MG tablet Take 5 mg by mouth.     Historical Provider, MD  sertraline (ZOLOFT) 50 MG tablet Take 1 tablet  (50 mg total) by mouth daily. 12/26/15   Margarita Rana, MD  triamterene-hydrochlorothiazide (MAXZIDE) 75-50 MG tablet Take 1 tablet by mouth daily. 09/02/15   Margarita Rana, MD    Allergies as of 12/23/2015 - Review Complete 12/16/2015  Allergen Reaction Noted  . Codeine sulfate Itching and Rash 09/02/2015    Family History  Problem Relation Age of Onset  . Hypertension Mother   . Arthritis Mother   . Gout Mother   . Diabetes Father   . Alzheimer's disease Father   . Hypertension Father   . Hypertension Brother     Social History   Social History  . Marital Status: Single    Spouse Name: N/A  . Number of Children: N/A  . Years of Education: N/A   Occupational History  . Not on file.   Social History Main Topics  . Smoking status: Current Every Day Smoker -- 0.25 packs/day for 30 years    Types: Cigarettes  . Smokeless tobacco: Never Used  . Alcohol Use: Yes     Comment: 2-3 beers 2-3 times a week.  . Drug Use: No     Comment: Used cocaine in the past and been incarcerated for this.  . Sexual Activity: Not on file   Other Topics Concern  . Not on file   Social History Narrative    Review of  Systems: See HPI, otherwise negative ROS  Physical Exam: BP 125/97 mmHg  Pulse 80  Temp(Src) 96.6 F (35.9 C) (Tympanic)  Resp 18  Ht 5\' 10"  (1.778 m)  Wt 220 lb (99.791 kg)  BMI 31.57 kg/m2  SpO2 98% General:   Alert,  pleasant and cooperative in NAD Head:  Normocephalic and atraumatic. Neck:  Supple; no masses or thyromegaly. Lungs:  Clear throughout to auscultation.    Heart:  Regular rate and rhythm. Abdomen:  Soft, nontender and nondistended. Normal bowel sounds, without guarding, and without rebound.   Neurologic:  Alert and  oriented x4;  grossly normal neurologically.  Impression/Plan: [place] is now here to undergo a screening colonoscopy.  Risks, benefits, and alternatives regarding colonoscopy have been reviewed with the patient.  Questions  have been answered.  All parties agreeable.

## 2016-02-11 NOTE — Transfer of Care (Signed)
Immediate Anesthesia Transfer of Care Note  Patient: Elizabeth Hogan  Procedure(s) Performed: Procedure(s): COLONOSCOPY WITH PROPOFOL (N/A)  Patient Location: PACU  Anesthesia Type:General  Level of Consciousness: awake  Airway & Oxygen Therapy: Patient Spontanous Breathing and Patient connected to nasal cannula oxygen  Post-op Assessment: Report given to RN and Post -op Vital signs reviewed and stable  Post vital signs: Reviewed and stable  Last Vitals:  Filed Vitals:   02/11/16 0850 02/11/16 1027  BP: 125/97 125/91  Pulse: 80 80  Temp: 35.9 C 36.2 C  Resp: 18 22    Complications: No apparent anesthesia complications

## 2016-02-11 NOTE — Anesthesia Preprocedure Evaluation (Signed)
Anesthesia Evaluation  Patient identified by MRN, date of birth, ID band Patient awake    Reviewed: Allergy & Precautions, H&P , NPO status , Patient's Chart, lab work & pertinent test results, reviewed documented beta blocker date and time   History of Anesthesia Complications Negative for: history of anesthetic complications  Airway Mallampati: II  TM Distance: >3 FB Neck ROM: full    Dental no notable dental hx. (+) Partial Upper, Missing, Poor Dentition   Pulmonary neg shortness of breath, neg sleep apnea, neg COPD, neg recent URI, Current Smoker,    Pulmonary exam normal breath sounds clear to auscultation       Cardiovascular Exercise Tolerance: Good hypertension, (-) angina(-) CAD, (-) Past MI, (-) Cardiac Stents and (-) CABG Normal cardiovascular exam(-) dysrhythmias (-) Valvular Problems/Murmurs Rhythm:regular Rate:Normal     Neuro/Psych neg Seizures PSYCHIATRIC DISORDERS (Anxiety)  Neuromuscular disease (5th nerve palsy)    GI/Hepatic Neg liver ROS, GERD  ,  Endo/Other  negative endocrine ROS  Renal/GU negative Renal ROS  negative genitourinary   Musculoskeletal   Abdominal   Peds  Hematology negative hematology ROS (+)   Anesthesia Other Findings Past Medical History:   Hypertension                                                 Anxiety                                                      Reproductive/Obstetrics negative OB ROS                             Anesthesia Physical Anesthesia Plan  ASA: II  Anesthesia Plan: General   Post-op Pain Management:    Induction:   Airway Management Planned:   Additional Equipment:   Intra-op Plan:   Post-operative Plan:   Informed Consent: I have reviewed the patients History and Physical, chart, labs and discussed the procedure including the risks, benefits and alternatives for the proposed anesthesia with the patient or  authorized representative who has indicated his/her understanding and acceptance.   Dental Advisory Given  Plan Discussed with: Anesthesiologist, CRNA and Surgeon  Anesthesia Plan Comments:         Anesthesia Quick Evaluation

## 2016-02-11 NOTE — Op Note (Signed)
[facility] Patient Name: Elizabeth Hogan Procedure Date: 02/11/2016 10:04 AM MRN: [id number] Account #: [id number] Date of Birth: 1956-02-28 Admit Type: Outpatient Age: 59 Room: [facility] ENDO ROOM 4 Gender: Female Note Status: Finalized Procedure:            Colonoscopy Indications:          Screening for colorectal malignant neoplasm Providers:            Lucilla Lame, MD Referring MD:         Jerrell Belfast, MD (Referring MD) Medicines:            Propofol per Anesthesia Complications:        No immediate complications. Procedure:            Pre-Anesthesia Assessment:                       - Prior to the procedure, a History and Physical was                        performed, and patient medications and allergies were                        reviewed. The patient's tolerance of previous                        anesthesia was also reviewed. The risks and benefits of                        the procedure and the sedation options and risks were                        discussed with the patient. All questions were                        answered, and informed consent was obtained. Prior                        Anticoagulants: The patient has taken no previous                        anticoagulant or antiplatelet agents. ASA Grade                        Assessment: II - A patient with mild systemic disease.                        After reviewing the risks and benefits, the patient was                        deemed in satisfactory condition to undergo the                        procedure.                       After obtaining informed consent, the colonoscope was                        passed under direct vision. Throughout the procedure,  the patient's blood pressure, pulse, and oxygen                        saturations were monitored continuously. The                        Colonoscope was introduced through the anus and         advanced to the the cecum, identified by appendiceal                        orifice and ileocecal valve. The colonoscopy was                        performed without difficulty. The patient tolerated the                        procedure well. The quality of the bowel preparation                        was good. Findings:      The perianal and digital rectal examinations were normal.      A 5 mm polyp was found in the transverse colon. The polyp was sessile.       The polyp was removed with a cold snare. Resection and retrieval were       complete.      A 7 mm polyp was found in the descending colon. The polyp was sessile.       The polyp was removed with a cold snare. Resection and retrieval were       complete.      Non-bleeding internal hemorrhoids were found during retroflexion. The       hemorrhoids were Grade I (internal hemorrhoids that do not prolapse). Impression:           - One 5 mm polyp in the transverse colon, removed with                        a cold snare. Resected and retrieved.                       - One 7 mm polyp in the descending colon, removed with                        a cold snare. Resected and retrieved.                       - Non-bleeding internal hemorrhoids. Recommendation:       - Await pathology results.                       - Repeat colonoscopy in 5 years if polyp adenoma and 10                        years if hyperplastic Procedure Code(s):    --- Professional ---                       [phone], Colonoscopy, flexible; with removal of tumor(s),                        polyp(s), or other  lesion(s) by snare technique Diagnosis Code(s):    --- Professional ---                       Z12.11, Encounter for screening for malignant neoplasm                        of colon                       D12.3, Benign neoplasm of transverse colon (hepatic                        flexure or splenic flexure)                       D12.4, Benign neoplasm of descending  colon CPT copyright 2016 American Medical Association. All rights reserved. The codes documented in this report are preliminary and upon coder review may  be revised to meet current compliance requirements. Lucilla Lame, MD 02/11/2016 10:24:45 AM This report has been signed electronically. Number of Addenda: 0 Note Initiated On: 02/11/2016 10:04 AM Scope Withdrawal Time: 0 hours 9 minutes 51 seconds  Total Procedure Duration: 0 hours 12 minutes 51 seconds       [facility]

## 2016-02-11 NOTE — Anesthesia Postprocedure Evaluation (Signed)
Anesthesia Post Note  Patient: [place]  Procedure(s) Performed: Procedure(s) (LRB): COLONOSCOPY WITH PROPOFOL (N/A)  Patient location during evaluation: Endoscopy Anesthesia Type: General Level of consciousness: awake and alert Pain management: pain level controlled Vital Signs Assessment: post-procedure vital signs reviewed and stable Respiratory status: spontaneous breathing, nonlabored ventilation, respiratory function stable and patient connected to nasal cannula oxygen Cardiovascular status: blood pressure returned to baseline and stable Postop Assessment: no signs of nausea or vomiting Anesthetic complications: no    Last Vitals:  Filed Vitals:   02/11/16 1050 02/11/16 1100  BP: 128/98 141/98  Pulse: 70 71  Temp:    Resp: 16 18    Last Pain: There were no vitals filed for this visit.               Martha Clan

## 2016-02-12 ENCOUNTER — Encounter: Payer: Self-pay | Admitting: Gastroenterology

## 2016-02-12 LAB — SURGICAL PATHOLOGY

## 2016-02-13 ENCOUNTER — Encounter: Payer: Self-pay | Admitting: Gastroenterology

## 2016-02-24 ENCOUNTER — Other Ambulatory Visit: Payer: Self-pay | Admitting: Family Medicine

## 2016-02-24 ENCOUNTER — Ambulatory Visit
Admission: RE | Admit: 2016-02-24 | Discharge: 2016-02-24 | Disposition: A | Discharge status: Home / Self Care | Payer: PPO | Source: Ambulatory Visit | Attending: Family Medicine | Admitting: Family Medicine

## 2016-02-24 DIAGNOSIS — Z1231 Encounter for screening mammogram for malignant neoplasm of breast: Secondary | ICD-10-CM | POA: Diagnosis not present

## 2016-02-24 DIAGNOSIS — Z1239 Encounter for other screening for malignant neoplasm of breast: Secondary | ICD-10-CM

## 2016-02-24 DIAGNOSIS — I10 Essential (primary) hypertension: Secondary | ICD-10-CM

## 2016-02-25 LAB — HM MAMMOGRAPHY

## 2016-03-16 ENCOUNTER — Ambulatory Visit (INDEPENDENT_AMBULATORY_CARE_PROVIDER_SITE_OTHER): Payer: PPO | Admitting: Family Medicine

## 2016-03-16 ENCOUNTER — Encounter: Payer: Self-pay | Admitting: Family Medicine

## 2016-03-16 VITALS — BP 132/90 | HR 92 | Temp 98.1°F | Resp 16 | Wt 211.0 lb

## 2016-03-16 DIAGNOSIS — F419 Anxiety disorder, unspecified: Secondary | ICD-10-CM | POA: Diagnosis not present

## 2016-03-16 DIAGNOSIS — I10 Essential (primary) hypertension: Secondary | ICD-10-CM

## 2016-03-16 DIAGNOSIS — F17200 Nicotine dependence, unspecified, uncomplicated: Secondary | ICD-10-CM | POA: Diagnosis not present

## 2016-03-16 MED ORDER — SERTRALINE HCL 100 MG PO TABS: 100.0000 mg | ORAL_TABLET | Freq: Every day | ORAL | Status: DC

## 2016-03-16 NOTE — Progress Notes (Signed)
Subjective:    Patient ID: Elizabeth Hogan, female    DOB: [birth year], 60 y.o.   MRN: [id number]  Hypertension This is a chronic problem. The problem is uncontrolled (BP is 131/90 today). Associated symptoms include anxiety, blurred vision, headaches, malaise/fatigue and palpitations (improved with Zoloft). Pertinent negatives include no chest pain, neck pain, orthopnea, peripheral edema or shortness of breath. Risk factors for coronary artery disease include smoking/tobacco exposure and post-menopausal state. Treatments tried: Amlodipine 2.5 mg, Maxzide 75-50 mg. Pt not taking Atenolol. States she thought the Amlodipine replaces it. The current treatment provides mild improvement.  Anxiety Presents for follow-up visit. The problem has been waxing and waning. Symptoms include depressed mood, irritability, nervous/anxious behavior, palpitations (improved with Zoloft) and restlessness. Patient reports no chest pain, decreased concentration, excessive worry, insomnia, panic, shortness of breath or suicidal ideas. The severity of symptoms is severe.   Past treatments include SSRIs (Zoloft 50 mg). The treatment provided moderate relief. Compliance with prior treatments has been good.      Review of Systems  Constitutional: Positive for malaise/fatigue and irritability.  Eyes: Positive for blurred vision.  Respiratory: Negative for shortness of breath.   Cardiovascular: Positive for palpitations (improved with Zoloft). Negative for chest pain and orthopnea.  Musculoskeletal: Negative for neck pain.  Neurological: Positive for headaches.  Psychiatric/Behavioral: Negative for suicidal ideas and decreased concentration. The patient is nervous/anxious. The patient does not have insomnia.    BP 132/90 mmHg  Pulse 92  Temp(Src) 98.1 F (36.7 C) (Oral)  Resp 16  Wt 211 lb (95.709 kg)   Patient Active Problem List   Diagnosis Date Noted  . Special screening for malignant neoplasms, colon   .  Benign neoplasm of transverse colon   . Benign neoplasm of descending colon   . Allergic rhinitis 09/02/2015  . Acid reflux 09/02/2015  . 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 Tobacco use disorder 09/02/2015  . 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 Anxiety 09/02/2015  . Essential (primary) hypertension 08/04/2005   Past Medical History  Diagnosis Date  . Hypertension   . Anxiety    Current Outpatient Prescriptions on File Prior to Visit  Medication Sig  . amLODipine (NORVASC) 2.5 MG tablet Take 1 tablet (2.5 mg total) by mouth daily.  . celecoxib (CELEBREX) 100 MG capsule Take 100 mg by mouth daily.   Marland Kitchen gabapentin (NEURONTIN) 300 MG capsule Take 300 mg by mouth 3 (three) times daily.   Marland Kitchen levothyroxine (SYNTHROID, LEVOTHROID) 75 MCG tablet Take 1 tablet (75 mcg total) by mouth daily.  Marland Kitchen LORazepam (ATIVAN) 1 MG tablet Take 1 tablet (1 mg total) by mouth 2 (two) times daily.  . nortriptyline (PAMELOR) 50 MG capsule Take 50 mg by mouth at bedtime.   Marland Kitchen omeprazole (PRILOSEC) 20 MG capsule Take 20 mg by mouth daily.  . polyethylene glycol (GOLYTELY) 236 g solution Take 4,000 mLs by mouth once. Drink one 8 oz glass every 30 mins until stools are clear.  . rizatriptan (MAXALT) 5 MG tablet Take 5 mg by mouth.   . sertraline (ZOLOFT) 50 MG tablet Take 1 tablet (50 mg total) by mouth daily.  Marland Kitchen triamterene-hydrochlorothiazide (MAXZIDE) 75-50 MG tablet TAKE ONE TABLET BY MOUTH ONCE DAILY  . atenolol (TENORMIN) 25 MG tablet TAKE ONE TABLET BY MOUTH ONCE DAILY (Patient not taking: Reported on 02/11/2016)   No current facility-administered medications on file prior to visit.   Allergies  Allergen Reactions  . Codeine Sulfate Itching  and Rash   Past Surgical History  Procedure Laterality Date  . Laminectomy  07/2010    Secondary to Job injury in 2010, Subsequent nerve damage and followed by [facility]  . Abdominal hysterectomy  2005    still has cervix  . Tubal ligation    .  Colonoscopy with propofol N/A 02/11/2016    Procedure: COLONOSCOPY WITH PROPOFOL;  Surgeon: Lucilla Lame, MD;  Location: [facility] ENDOSCOPY;  Service: Endoscopy;  Laterality: N/A;   Social History   Social History  . Marital Status: Single    Spouse Name: N/A  . Number of Children: N/A  . Years of Education: N/A   Occupational History  . Not on file.   Social History Main Topics  . Smoking status: Current Every Day Smoker -- 0.00 packs/day for 30 years    Types: Cigarettes  . Smokeless tobacco: Never Used  . Alcohol Use: Yes     Comment: 2-3 beers 2-3 times a week.  . Drug Use: No     Comment: Used cocaine in the past and been incarcerated for this.  . Sexual Activity: Not on file     Comment: 1 pack lasts pt 3-4 days   Other Topics Concern  . Not on file   Social History Narrative   Family History  Problem Relation Age of Onset  . Hypertension Mother   . Arthritis Mother   . Gout Mother   . Diabetes Father   . Alzheimer's disease Father   . Hypertension Father   . Hypertension Brother   . Breast cancer Cousin 40    maternal side      Objective:   Physical Exam  Constitutional: She is oriented to person, place, and time. She appears well-developed and well-nourished.  Neurological: She is alert and oriented to person, place, and time.  Psychiatric: She has a normal mood and affect. Her behavior is normal. Judgment and thought content normal.   BP 132/90 mmHg  Pulse 92  Temp(Src) 98.1 F (36.7 C) (Oral)  Resp 16  Wt 211 lb (95.709 kg)      Assessment & Plan:  1. Essential (primary) hypertension Not quite at goal. Is off Atenolol.   Fairly close to goal today. Continue current blood pressure medication. Increase Zoloft.   Has fear of snakes. Discussed ways to increase exercise.    Will get labs once resolves issues with [employer]. Will call.   Also, needs EKG at follow up.  Will recheck ov in one month. Decided against EKG today as has ride waiting for her.  Will call  and come in earlier if any problems arise before follow up.    - sertraline (ZOLOFT) 100 MG tablet; Take 1 tablet (100 mg total) by mouth daily.  Dispense: 90 tablet; Refill: 1  2. Tobacco use disorder Stressed important of quitting.    3. Anxiety Improved, but not at goal.  Will increase medication.Patient instructed to call back if condition worsens or does not improve.    - sertraline (ZOLOFT) 100 MG tablet; Take 1 tablet (100 mg total) by mouth daily.  Dispense: 90 tablet; Refill: 1    Patient seen and examined by Jerrell Belfast, MD, and note scribed by Renaldo Fiddler, CMA.   I have reviewed the document for accuracy and completeness and I agree with above. Jerrell Belfast, MD   Margarita Rana, MD

## 2016-04-16 ENCOUNTER — Encounter: Payer: Self-pay | Admitting: Family Medicine

## 2016-04-16 ENCOUNTER — Ambulatory Visit (INDEPENDENT_AMBULATORY_CARE_PROVIDER_SITE_OTHER): Payer: PPO | Admitting: Family Medicine

## 2016-04-16 VITALS — BP 136/80 | HR 72 | Temp 98.3°F | Resp 16 | Wt 210.0 lb

## 2016-04-16 DIAGNOSIS — F419 Anxiety disorder, unspecified: Secondary | ICD-10-CM

## 2016-04-16 DIAGNOSIS — K219 Gastro-esophageal reflux disease without esophagitis: Secondary | ICD-10-CM

## 2016-04-16 DIAGNOSIS — Z833 Family history of diabetes mellitus: Secondary | ICD-10-CM | POA: Diagnosis not present

## 2016-04-16 DIAGNOSIS — I10 Essential (primary) hypertension: Secondary | ICD-10-CM | POA: Diagnosis not present

## 2016-04-16 DIAGNOSIS — F1910 Other psychoactive substance abuse, uncomplicated: Secondary | ICD-10-CM

## 2016-04-16 DIAGNOSIS — F1911 Other psychoactive substance abuse, in remission: Secondary | ICD-10-CM

## 2016-04-16 MED ORDER — AMLODIPINE BESYLATE 2.5 MG PO TABS: 2.5000 mg | ORAL_TABLET | Freq: Every day | ORAL | Status: DC

## 2016-04-16 NOTE — Progress Notes (Signed)
Patient ID: Elizabeth Hogan, female   DOB: 01/12/1956, 60 y.o.   MRN: [id number]         Patient: Elizabeth Hogan Female    DOB: 12-04-1955   60 y.o.   MRN: [id number] Visit Date: 04/16/2016  Today's Provider: Margarita Rana, MD   Chief Complaint  Patient presents with  . Anxiety  . Hypertension   Subjective:    Anxiety Presents for follow-up visit. The problem has been gradually improving (Pt only reports slight improvement with the increase of medication.  She says she has "So much going on and is trying to do too much". ). Symptoms include decreased concentration, depressed mood, excessive worry, irritability, nervous/anxious behavior and panic. Patient reports no dizziness, insomnia, malaise, palpitations, shortness of breath or suicidal ideas. The quality of sleep is good. Nighttime awakenings: occasional.    Hypertension This is a chronic problem. Associated symptoms include anxiety, headaches and malaise/fatigue. Pertinent negatives include no blurred vision, neck pain, palpitations, peripheral edema or shortness of breath. There are no compliance problems.        Allergies  Allergen Reactions  . Codeine Sulfate Itching and Rash   No outpatient prescriptions have been marked as taking for the 04/16/16 encounter (Office Visit) with Margarita Rana, MD.    Review of Systems  Constitutional: Positive for malaise/fatigue, irritability and fatigue. Negative for fever, chills, diaphoresis, activity change, appetite change and unexpected weight change.  Eyes: Negative for blurred vision.  Respiratory: Negative.  Negative for shortness of breath.   Cardiovascular: Negative.  Negative for palpitations.  Gastrointestinal: Negative.   Musculoskeletal: Negative.  Negative for neck pain.  Neurological: Positive for weakness, light-headedness and headaches. Negative for dizziness, tremors and numbness.  Psychiatric/Behavioral: Positive for dysphoric mood and decreased concentration.  Negative for suicidal ideas, hallucinations, behavioral problems, sleep disturbance, self-injury and agitation. The patient is nervous/anxious. The patient does not have insomnia.     Social History  Substance Use Topics  . Smoking status: Current Every Day Smoker -- 0.00 packs/day for 30 years    Types: Cigarettes  . Smokeless tobacco: Never Used  . Alcohol Use: Yes     Comment: 2-3 beers 2-3 times a week.   Objective:   BP 136/80 mmHg  Pulse 72  Temp(Src) 98.3 F (36.8 C) (Oral)  Resp 16  Wt 210 lb (95.255 kg)  Physical Exam  Constitutional: She is oriented to person, place, and time. She appears well-developed and well-nourished.  Cardiovascular: Normal rate, regular rhythm and normal heart sounds.   Pulmonary/Chest: Effort normal and breath sounds normal.  Neurological: She is alert and oriented to person, place, and time.  Skin: Skin is warm and dry.  Psychiatric: She has a normal mood and affect. Her behavior is normal. Judgment and thought content normal.      Assessment & Plan:     1. Essential (primary) hypertension Improved; continue current medicines.  Recheck in three months Jenni.  - amLODipine (NORVASC) 2.5 MG tablet; Take 1 tablet (2.5 mg total) by mouth daily.  Dispense: 90 tablet; Refill: 1  2. Anxiety Only slightly improved; but will continue current medications.  Recheck in three months with Tawanna Sat.   Patient was seen and examined by Jerrell Belfast, MD, and note scribed by Ashley Royalty, CMA.  I have reviewed the document for accuracy and completeness and I agree with above. - Jerrell Belfast, MD      Margarita Rana, MD  [facility]

## 2016-05-26 ENCOUNTER — Telehealth: Payer: Self-pay | Admitting: Physician Assistant

## 2016-05-26 DIAGNOSIS — F419 Anxiety disorder, unspecified: Secondary | ICD-10-CM

## 2016-05-26 DIAGNOSIS — K219 Gastro-esophageal reflux disease without esophagitis: Secondary | ICD-10-CM

## 2016-05-26 DIAGNOSIS — F1911 Other psychoactive substance abuse, in remission: Secondary | ICD-10-CM

## 2016-05-26 DIAGNOSIS — I10 Essential (primary) hypertension: Secondary | ICD-10-CM

## 2016-05-26 DIAGNOSIS — Z833 Family history of diabetes mellitus: Secondary | ICD-10-CM

## 2016-05-26 NOTE — Telephone Encounter (Signed)
On 06/08 it looks like Dr.Maloney order CBC with Diff,Comprehensice metabolic 123456 panel,hep C antibody and HIV with reflex. Can you double check for me to see I have the correct ones.  Thanks.

## 2016-05-26 NOTE — Telephone Encounter (Signed)
Patient advised lab order placed up front.  Per patient her daughter Servando Salina Meal will be picking the requisition form.  Thanks,  -Joseline

## 2016-05-26 NOTE — Telephone Encounter (Signed)
Pt stated that she didn't have her labs that Dr. Venia Minks ordered June 2017 and would like to pick up another lab slip. Please advise. Thanks TNP

## 2016-05-26 NOTE — Telephone Encounter (Signed)
Yes that is what I see also. Ok to order.

## 2016-07-28 ENCOUNTER — Ambulatory Visit: Payer: PPO | Admitting: Family Medicine

## 2016-07-30 ENCOUNTER — Encounter: Payer: Self-pay | Admitting: Family Medicine

## 2016-07-30 ENCOUNTER — Encounter: Payer: Self-pay | Admitting: *Deleted

## 2016-07-30 ENCOUNTER — Ambulatory Visit (INDEPENDENT_AMBULATORY_CARE_PROVIDER_SITE_OTHER): Payer: PPO | Admitting: Family Medicine

## 2016-07-30 VITALS — BP 122/78 | HR 84 | Temp 97.9°F | Wt 206.0 lb

## 2016-07-30 DIAGNOSIS — K429 Umbilical hernia without obstruction or gangrene: Secondary | ICD-10-CM

## 2016-07-30 NOTE — Progress Notes (Signed)
Patient: Elizabeth Hogan Female    DOB: [birth year]   60 y.o.   MRN: [id number] Visit Date: 07/30/2016  Today's Provider: Vernie Murders, PA   Chief Complaint  Patient presents with  . Abdominal Pain   Subjective:    Abdominal Pain  This is a new problem. The current episode started 1 to 4 weeks ago. The onset quality is gradual. The problem occurs intermittently. The problem has been gradually improving. Pain location: umbilical region. The patient is experiencing no pain. The abdominal pain does not radiate. 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 Relieved by: rest off her feet. She has tried nothing for the symptoms.   Past Medical History:  Diagnosis Date  . Anxiety   . Hypertension    Patient Active Problem List   Diagnosis Date Noted  . Special screening for malignant neoplasms, colon   . Benign neoplasm of transverse colon   . Benign neoplasm of descending colon   . Allergic rhinitis 09/02/2015  . Acid reflux 09/02/2015  . 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 Tobacco use disorder 09/02/2015  . 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 Anxiety 09/02/2015  . Essential (primary) hypertension 08/04/2005   Past Surgical History:  Procedure Laterality Date  . ABDOMINAL HYSTERECTOMY  2005   still has cervix  . COLONOSCOPY WITH PROPOFOL N/A 02/11/2016   Procedure: COLONOSCOPY WITH PROPOFOL;  Surgeon: Lucilla Lame, MD;  Location: [facility] ENDOSCOPY;  Service: Endoscopy;  Laterality: N/A;  . LAMINECTOMY  07/2010   Secondary to Job injury in 2010, Subsequent nerve damage and followed by [facility]  . TUBAL LIGATION     Family History  Problem Relation Age of Onset  . Hypertension Mother   . Arthritis Mother   . Gout Mother   . Diabetes Father   . Alzheimer's disease Father   . Hypertension Father   . Hypertension Brother   . Breast cancer Cousin 40    maternal side   Allergies  Allergen Reactions  . Codeine Sulfate Itching and Rash     Previous  Medications   AMLODIPINE (NORVASC) 2.5 MG TABLET    Take 1 tablet (2.5 mg total) by mouth daily.   CELECOXIB (CELEBREX) 100 MG CAPSULE    Take 100 mg by mouth daily.    GABAPENTIN (NEURONTIN) 300 MG CAPSULE    Take 300 mg by mouth 3 (three) times daily.    LEVOTHYROXINE (SYNTHROID, LEVOTHROID) 75 MCG TABLET    Take 1 tablet (75 mcg total) by mouth daily.   LORAZEPAM (ATIVAN) 1 MG TABLET    Take 1 tablet (1 mg total) by mouth 2 (two) times daily.   NORTRIPTYLINE (PAMELOR) 50 MG CAPSULE    Take 50 mg by mouth at bedtime.    OMEPRAZOLE (PRILOSEC) 20 MG CAPSULE    Take 20 mg by mouth daily.   SERTRALINE (ZOLOFT) 100 MG TABLET    Take 1 tablet (100 mg total) by mouth daily.   TRIAMTERENE-HYDROCHLOROTHIAZIDE (MAXZIDE) 75-50 MG TABLET    TAKE ONE TABLET BY MOUTH ONCE DAILY    Review of Systems  Constitutional: Negative.   Respiratory: Negative.   Cardiovascular: Negative.   Gastrointestinal: Positive for abdominal pain.    Social History  Substance Use Topics  . Smoking status: Current Every Day Smoker    Packs/day: 0.00    Years: 30.00    Types: Cigarettes  . Smokeless tobacco: Never Used  . Alcohol use Yes     Comment: 2-3 beers  2-3 times a week.   Objective:   BP 122/78 (BP Location: Right Arm, Patient Position: Sitting, Cuff Size: Large)   Pulse 84   Temp 97.9 F (36.6 C) (Oral)   Wt 206 lb (93.4 kg)   SpO2 91%   BMI 29.56 kg/m   Physical Exam  Constitutional: She appears well-developed and well-nourished.  HENT:  Head: Normocephalic.  Eyes: Conjunctivae are normal.  Neck: Neck supple.  Cardiovascular: Normal rate and regular rhythm.   Pulmonary/Chest: Effort normal and breath sounds normal.  Abdominal: Soft. Bowel sounds are normal. There is tenderness.  Small but tender umbilical defect. Notice more discomfort and slight protrusion when sitting up from supine position.      Assessment & Plan:     1. Umbilical hernia without obstruction and without  gangrene Intermittent swelling and pain behind umbilicus. Still tender today but no sign of incarceration. Schedule referral to surgeon to plan treatment or surgery. - Ambulatory referral to General Surgery

## 2016-08-17 ENCOUNTER — Ambulatory Visit: Payer: Self-pay | Admitting: General Surgery

## 2016-08-18 ENCOUNTER — Encounter: Payer: Self-pay | Admitting: *Deleted

## 2016-09-16 ENCOUNTER — Other Ambulatory Visit: Payer: Self-pay | Admitting: Family Medicine

## 2016-09-16 DIAGNOSIS — I10 Essential (primary) hypertension: Secondary | ICD-10-CM

## 2016-10-12 ENCOUNTER — Encounter: Payer: Self-pay | Admitting: Family Medicine

## 2016-10-12 ENCOUNTER — Ambulatory Visit (INDEPENDENT_AMBULATORY_CARE_PROVIDER_SITE_OTHER): Payer: PPO | Admitting: Family Medicine

## 2016-10-12 ENCOUNTER — Ambulatory Visit: Payer: PPO | Admitting: Family Medicine

## 2016-10-12 VITALS — BP 114/72 | HR 82 | Temp 98.1°F | Resp 16 | Wt 205.0 lb

## 2016-10-12 DIAGNOSIS — Z1159 Encounter for screening for other viral diseases: Secondary | ICD-10-CM

## 2016-10-12 DIAGNOSIS — Z833 Family history of diabetes mellitus: Secondary | ICD-10-CM

## 2016-10-12 DIAGNOSIS — I10 Essential (primary) hypertension: Secondary | ICD-10-CM | POA: Diagnosis not present

## 2016-10-12 DIAGNOSIS — J0100 Acute maxillary sinusitis, unspecified: Secondary | ICD-10-CM

## 2016-10-12 DIAGNOSIS — Z114 Encounter for screening for human immunodeficiency virus [HIV]: Secondary | ICD-10-CM | POA: Diagnosis not present

## 2016-10-12 MED ORDER — AMOXICILLIN 875 MG PO TABS: 875.0000 mg | ORAL_TABLET | Freq: Two times a day (BID) | ORAL | 0 refills | Status: DC

## 2016-10-12 MED ORDER — FLUTICASONE PROPIONATE 50 MCG/ACT NA SUSP: 2.0000 | Freq: Every day | NASAL | 6 refills | Status: DC

## 2016-10-12 NOTE — Progress Notes (Signed)
Patient: Elizabeth Hogan Female    DOB: 09-Sep-1956   60 y.o.   MRN: [id number] Visit Date: 10/12/2016  Today's Provider: Vernie Murders, PA   Chief Complaint  Patient presents with  . Sinusitis  . Headache   Subjective:    HPI Pt Is here today for headache and sinus pain and pressure. She reports that it has been going on for about a week. She describes her headache as throbbing pain and is located above her eyes and forehead. Denies fever, chills, body aches or cough.   Patient Active Problem List   Diagnosis Date Noted  . Special screening for malignant neoplasms, colon   . Benign neoplasm of transverse colon   . Benign neoplasm of descending colon   . Allergic rhinitis 09/02/2015  . Acid reflux 09/02/2015  . 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 Tobacco use disorder 09/02/2015  . 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 Anxiety 09/02/2015  . Essential (primary) hypertension 08/04/2005   Past Surgical History:  Procedure Laterality Date  . ABDOMINAL HYSTERECTOMY  2005   still has cervix  . COLONOSCOPY WITH PROPOFOL N/A 02/11/2016   Procedure: COLONOSCOPY WITH PROPOFOL;  Surgeon: Lucilla Lame, MD;  Location: [facility] ENDOSCOPY;  Service: Endoscopy;  Laterality: N/A;  . LAMINECTOMY  07/2010   Secondary to Job injury in 2010, Subsequent nerve damage and followed by [facility]  . TUBAL LIGATION     Family History  Problem Relation Age of Onset  . Hypertension Mother   . Arthritis Mother   . Gout Mother   . Diabetes Father   . Alzheimer's disease Father   . Hypertension Father   . Hypertension Brother   . Breast cancer Cousin 40    maternal side    Allergies  Allergen Reactions  . Codeine Sulfate Itching and Rash     Current Outpatient Prescriptions:  .  amLODipine (NORVASC) 2.5 MG tablet, Take 1 tablet (2.5 mg total) by mouth daily., Disp: 90 tablet, Rfl: 1 .  celecoxib (CELEBREX) 100 MG capsule, Take 100 mg by mouth daily. , Disp: , Rfl:  .   gabapentin (NEURONTIN) 300 MG capsule, Take 300 mg by mouth 3 (three) times daily. , Disp: , Rfl:  .  levothyroxine (SYNTHROID, LEVOTHROID) 75 MCG tablet, Take 1 tablet (75 mcg total) by mouth daily., Disp: 90 tablet, Rfl: 3 .  nortriptyline (PAMELOR) 50 MG capsule, Take 50 mg by mouth at bedtime. , Disp: , Rfl:  .  omeprazole (PRILOSEC) 20 MG capsule, Take 20 mg by mouth daily., Disp: , Rfl:  .  sertraline (ZOLOFT) 100 MG tablet, Take 1 tablet (100 mg total) by mouth daily., Disp: 90 tablet, Rfl: 1 .  triamterene-hydrochlorothiazide (MAXZIDE) 75-50 MG tablet, TAKE ONE TABLET BY MOUTH ONCE DAILY, Disp: 90 tablet, Rfl: 1  Review of Systems  Constitutional: Negative.   HENT: Positive for sinus pain and sinus pressure.   Eyes: Negative.   Respiratory: Negative.   Cardiovascular: Negative.   Gastrointestinal: Negative.   Endocrine: Negative.   Genitourinary: Negative.   Musculoskeletal: Negative.   Skin: Negative.   Allergic/Immunologic: Negative.   Neurological: Positive for headaches.  Hematological: Negative.   Psychiatric/Behavioral: Negative.     Social History  Substance Use Topics  . Smoking status: Current Every Day Smoker    Packs/day: 0.00    Years: 30.00    Types: Cigarettes  . Smokeless tobacco: Never Used  . Alcohol use Yes  Comment: 2-3 beers 2-3 times a week.   Objective:   BP 114/72 (BP Location: Right Arm, Patient Position: Sitting, Cuff Size: Normal)   Pulse 82   Temp 98.1 F (36.7 C) (Oral)   Resp 16   Wt 205 lb (93 kg)   SpO2 98%   BMI 29.41 kg/m   Physical Exam  Constitutional: She is oriented to person, place, and time. She appears well-developed and well-nourished.  HENT:  Head: Normocephalic.  Right Ear: External ear normal.  Left Ear: External ear normal.  Slightly cobblestoning of posterior pharynx. No exudates. Maxillary sinuses tender to palpate and no transillumination.  Eyes: Conjunctivae are normal.  Neck: Neck supple.    Cardiovascular: Normal rate and regular rhythm.   Pulmonary/Chest: Effort normal and breath sounds normal.  Abdominal: Soft. Bowel sounds are normal.  Lymphadenopathy:    She has no cervical adenopathy.  Neurological: She is alert and oriented to person, place, and time.      Assessment & Plan:     1. Acute maxillary sinusitis, recurrence not specified Onset with facial pressure in maxillary region when she bends over. No fever or chills. No transillumination of tender maxillary sinuses. Will treat with antibiotic, flonase, Mucinex-DM and Claritin-D. Increase fluid intake. Recheck prn. - amoxicillin (AMOXIL) 875 MG tablet; Take 1 tablet (875 mg total) by mouth 2 (two) times daily.  Dispense: 20 tablet; Refill: 0 - fluticasone (FLONASE) 50 MCG/ACT nasal spray; Place 2 sprays into both nostrils daily.  Dispense: 16 g; Refill: 6 - CBC with Differential/Platelet - Comprehensive metabolic panel  2. Essential (primary) hypertension Stable and well controlled with Amlodipine and Maxzide. Recheck routine labs. - Hemoglobin A1c - CBC with Differential/Platelet - Comprehensive metabolic panel - Lipid panel - TSH  3. Family history of diabetes mellitus No significant symptoms. No polyuria, polydipsia or vision disturbance. Check CMP and Hgb A1C. - Hemoglobin A1c  4. Screening for HIV (human immunodeficiency virus) - HIV antibody  5. Need for hepatitis C screening test - Hepatitis C antibody       Vernie Murders, PA  [facility]

## 2016-10-12 NOTE — Progress Notes (Deleted)
   Patient: Leanette Boward [place] Female    DOB: Feb 17, 1956   60 y.o.   MRN: [id number] Visit Date: 10/12/2016  Today's Provider: Vernie Murders, PA   No chief complaint on file.  Subjective:    HPI    Previous Medications   AMLODIPINE (NORVASC) 2.5 MG TABLET    Take 1 tablet (2.5 mg total) by mouth daily.   CELECOXIB (CELEBREX) 100 MG CAPSULE    Take 100 mg by mouth daily.    GABAPENTIN (NEURONTIN) 300 MG CAPSULE    Take 300 mg by mouth 3 (three) times daily.    LEVOTHYROXINE (SYNTHROID, LEVOTHROID) 75 MCG TABLET    Take 1 tablet (75 mcg total) by mouth daily.   NORTRIPTYLINE (PAMELOR) 50 MG CAPSULE    Take 50 mg by mouth at bedtime.    OMEPRAZOLE (PRILOSEC) 20 MG CAPSULE    Take 20 mg by mouth daily.   SERTRALINE (ZOLOFT) 100 MG TABLET    Take 1 tablet (100 mg total) by mouth daily.   TRIAMTERENE-HYDROCHLOROTHIAZIDE (MAXZIDE) 75-50 MG TABLET    TAKE ONE TABLET BY MOUTH ONCE DAILY    Review of Systems  Social History  Substance Use Topics  . Smoking status: Current Every Day Smoker    Packs/day: 0.00    Years: 30.00    Types: Cigarettes  . Smokeless tobacco: Never Used  . Alcohol use Yes     Comment: 2-3 beers 2-3 times a week.   Objective:   There were no vitals taken for this visit.  Physical Exam      Assessment & Plan:       Follow up: No Follow-up on file.

## 2016-10-16 ENCOUNTER — Telehealth: Payer: Self-pay | Admitting: Family Medicine

## 2016-10-16 MED ORDER — FLUCONAZOLE 150 MG PO TABS: 150.0000 mg | ORAL_TABLET | Freq: Once | ORAL | 0 refills | Status: AC

## 2016-10-16 NOTE — Telephone Encounter (Signed)
Pt states she rec'd an antibiotic on Monday and now she is have itching and burning in her vaginal area. Pt is asking if she can get a Rx for this.  [place]  CB#[phone]/MW

## 2016-10-16 NOTE — Telephone Encounter (Signed)
Please advise. Thanks.  

## 2016-10-16 NOTE — Telephone Encounter (Signed)
Call in Diflucan 150 mg 1 tablet by mouth stat #1. May use Monistat or Gyne-Lotrimin Cream OTC if needed for topical relief.

## 2016-10-21 ENCOUNTER — Telehealth: Payer: Self-pay | Admitting: Family Medicine

## 2016-10-21 NOTE — Telephone Encounter (Signed)
Pt called for a refill on   celecoxib (CELEBREX) 100 MG capsule  She said she thought when she came in last that it was supposed to be faxed to the pharmacy .  ThanksTeri

## 2016-10-21 NOTE — Telephone Encounter (Signed)
Last office visit on 10-12-16 was focused on sinusitis, primarily. Why does she take the Celebrex? (so we can attach it to a diagnosis insurance will understand and help with coverage)

## 2016-10-22 NOTE — Telephone Encounter (Signed)
Patient states she takes the Celebrex for back issues. Medication was originally prescribed by a provider in [facility]. She said that they were suppose to send her medical records from there also.   She is out of Celebrex and needs ASAP.

## 2016-10-22 NOTE — Telephone Encounter (Signed)
Pt is returning call.  CB#[phone]/MW °

## 2016-10-22 NOTE — Telephone Encounter (Signed)
LMTCB

## 2016-10-23 MED ORDER — CELECOXIB 100 MG PO CAPS: 100.0000 mg | ORAL_CAPSULE | Freq: Every day | ORAL | 6 refills | Status: DC

## 2016-10-23 NOTE — Telephone Encounter (Signed)
Sent refill to the [facility].

## 2016-10-23 NOTE — Telephone Encounter (Signed)
Left patient a voicemail advising her the RX has been sent to pharmacy.

## 2017-01-19 ENCOUNTER — Encounter: Payer: Self-pay | Admitting: Physician Assistant

## 2017-01-20 ENCOUNTER — Encounter: Payer: Self-pay | Admitting: Physician Assistant

## 2017-03-09 ENCOUNTER — Other Ambulatory Visit: Payer: Self-pay | Admitting: Family Medicine

## 2017-03-26 ENCOUNTER — Other Ambulatory Visit: Payer: Self-pay | Admitting: Family Medicine

## 2017-03-26 ENCOUNTER — Telehealth: Payer: Self-pay | Admitting: Family Medicine

## 2017-03-26 DIAGNOSIS — G509 Disorder of trigeminal nerve, unspecified: Secondary | ICD-10-CM

## 2017-03-26 MED ORDER — CELECOXIB 100 MG PO CAPS: 100.0000 mg | ORAL_CAPSULE | Freq: Every day | ORAL | 6 refills | Status: DC

## 2017-03-26 NOTE — Telephone Encounter (Signed)
Done

## 2017-03-26 NOTE — Telephone Encounter (Signed)
Pt needs refill on her pain med.  She is in a lot of pain and wont be able to get in until end of next week.  I did not see a medication for pain listed.  She uses [facility].  Pt's call back is [phone]  Thanks Con Memos

## 2017-03-26 NOTE — Telephone Encounter (Signed)
Patient advised RX has been sent to pharmacy  

## 2017-04-01 ENCOUNTER — Other Ambulatory Visit: Payer: Self-pay | Admitting: Family Medicine

## 2017-04-14 ENCOUNTER — Telehealth: Payer: Self-pay | Admitting: Family Medicine

## 2017-04-14 NOTE — Telephone Encounter (Signed)
Pt called and declined having a AWE/MW

## 2017-04-20 ENCOUNTER — Telehealth: Payer: Self-pay

## 2017-04-20 ENCOUNTER — Other Ambulatory Visit: Payer: Self-pay | Admitting: Family Medicine

## 2017-04-20 DIAGNOSIS — Z1231 Encounter for screening mammogram for malignant neoplasm of breast: Secondary | ICD-10-CM

## 2017-04-20 NOTE — Telephone Encounter (Signed)
Patient advised. She states she did contact [facility]. She has a CPE scheduled for 6/18 to discuss medications

## 2017-04-20 NOTE — Telephone Encounter (Signed)
Attempted to contact patient. No answer and unable to leave a message due to voicemail being full.   **Received a request from [facility]" which seems to be a custom pharmacy. They were requesting new RX for ::  -nortriptyline (PAMELOR) 50 MG capsule, -triamterene-hydrochlorothiazide (MAXZIDE) 75-50 MG tablet -gabapentin (NEURONTIN) 300 MG capsule -amLODipine (NORVASC) 2.5 MG tablet -chlorzoxazone- which is not on patient's medication list  Need to see if patient is actually requesting these medication be sent to [facility] or if this is just a sales. If patient requested per Simona Huh she will need a follow up appointment to refill medications.

## 2017-04-23 ENCOUNTER — Ambulatory Visit: Payer: PPO

## 2017-04-26 ENCOUNTER — Other Ambulatory Visit: Payer: Self-pay

## 2017-04-26 ENCOUNTER — Other Ambulatory Visit: Payer: Self-pay | Admitting: Family Medicine

## 2017-04-26 ENCOUNTER — Encounter: Payer: PPO | Admitting: Family Medicine

## 2017-04-26 DIAGNOSIS — I10 Essential (primary) hypertension: Secondary | ICD-10-CM

## 2017-04-26 NOTE — Progress Notes (Deleted)
Patient: Elizabeth Hogan, Female    DOB: [birth year], 61 y.o.   MRN: [id number] Visit Date: 04/26/2017  Today's Provider: Vernie Murders, PA   No chief complaint on file.  Subjective:    Annual physical exam Elizabeth Hogan is a 61 y.o. female who presents today for health maintenance and complete physical. She feels {DESC; WELL/FAIRLY WELL/POORLY:18703}. She reports exercising ***. She reports she is sleeping {DESC; WELL/FAIRLY WELL/POORLY:18703}.  ----------------------------------------------------------------- Last CPE: 12/16/2015 Last mammogram: 02/24/2016 Last Colonoscopy: 02/11/2016  Review of Systems  Constitutional: Negative.   HENT: Negative.   Eyes: Negative.   Respiratory: Negative.   Cardiovascular: Negative.   Gastrointestinal: Negative.   Endocrine: Negative.   Genitourinary: Negative.   Musculoskeletal: Negative.   Skin: Negative.   Neurological: Negative.   Hematological: Negative.   Psychiatric/Behavioral: Negative.     Social History      She  reports that she has been smoking Cigarettes.  She has been smoking about 0.00 packs per day for the past 30.00 years. She has never used smokeless tobacco. She reports that she drinks alcohol. She reports that she does not use drugs.       Social History   Social History  . Marital status: Single    Spouse name: N/A  . Number of children: N/A  . Years of education: N/A   Social History Main Topics  . Smoking status: Current Every Day Smoker    Packs/day: 0.00    Years: 30.00    Types: Cigarettes  . Smokeless tobacco: Never Used  . Alcohol use Yes     Comment: 2-3 beers 2-3 times a week.  . Drug use: No     Comment: Used cocaine in the past and been incarcerated for this.  . Sexual activity: Not on file     Comment: 1 pack lasts pt 3-4 days   Other Topics Concern  . Not on file   Social History Narrative  . No narrative on file    Past Medical History:  Diagnosis Date  . Anxiety   .  Hypertension      Patient Active Problem List   Diagnosis Date Noted  . Special screening for malignant neoplasms, colon   . Benign neoplasm of transverse colon   . Benign neoplasm of descending colon   . Allergic rhinitis 09/02/2015  . Acid reflux 09/02/2015  . 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 Tobacco use disorder 09/02/2015  . 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 Anxiety 09/02/2015  . Essential (primary) hypertension 08/04/2005    Past Surgical History:  Procedure Laterality Date  . ABDOMINAL HYSTERECTOMY  2005   still has cervix  . COLONOSCOPY WITH PROPOFOL N/A 02/11/2016   Procedure: COLONOSCOPY WITH PROPOFOL;  Surgeon: Lucilla Lame, MD;  Location: [facility] ENDOSCOPY;  Service: Endoscopy;  Laterality: N/A;  . LAMINECTOMY  07/2010   Secondary to Job injury in 2010, Subsequent nerve damage and followed by [facility]  . TUBAL LIGATION      Family History        Family Status  Relation Status  . Mother Alive  . Father Deceased at age 67's  . Brother Deceased at age 75's       cause of death was kidney failure with attempted transplant  . Sister Alive  . Brother Alive  . Cousin (Not Specified)        Her family history includes Alzheimer's disease in her father; Arthritis in her  mother; Breast cancer (age of onset: 81) in her cousin; Diabetes in her father; Gout in her mother; Hypertension in her brother, father, and mother.     Allergies  Allergen Reactions  . Codeine Sulfate Itching and Rash     Current Outpatient Prescriptions:  .  amLODipine (NORVASC) 2.5 MG tablet, Take 1 tablet (2.5 mg total) by mouth daily., Disp: 90 tablet, Rfl: 1 .  amoxicillin (AMOXIL) 875 MG tablet, Take 1 tablet (875 mg total) by mouth 2 (two) times daily., Disp: 20 tablet, Rfl: 0 .  celecoxib (CELEBREX) 100 MG capsule, Take 1 capsule (100 mg total) by mouth daily., Disp: 30 capsule, Rfl: 6 .  fluticasone (FLONASE) 50 MCG/ACT nasal spray, Place 2 sprays into both nostrils  daily., Disp: 16 g, Rfl: 6 .  gabapentin (NEURONTIN) 300 MG capsule, Take 300 mg by mouth 3 (three) times daily. , Disp: , Rfl:  .  levothyroxine (SYNTHROID, LEVOTHROID) 75 MCG tablet, Take 1 tablet (75 mcg total) by mouth daily., Disp: 90 tablet, Rfl: 3 .  nortriptyline (PAMELOR) 50 MG capsule, Take 50 mg by mouth at bedtime. , Disp: , Rfl:  .  omeprazole (PRILOSEC) 20 MG capsule, Take 20 mg by mouth daily., Disp: , Rfl:  .  sertraline (ZOLOFT) 100 MG tablet, Take 1 tablet (100 mg total) by mouth daily., Disp: 90 tablet, Rfl: 1 .  triamterene-hydrochlorothiazide (MAXZIDE) 75-50 MG tablet, TAKE ONE TABLET BY MOUTH ONCE DAILY, Disp: 90 tablet, Rfl: 1   Patient Care Team: Chrismon, Vickki Muff, PA as PCP - General (Family Medicine) Christene Lye, MD (General Surgery)      Objective:   Vitals: There were no vitals taken for this visit.  There were no vitals filed for this visit.   Physical Exam   Depression Screen PHQ 2/9 Scores 12/16/2015 09/02/2015 09/02/2015  PHQ - 2 Score 5 5 3   PHQ- 9 Score 10 9 -      Assessment & Plan:     Routine Health Maintenance and Physical Exam  Exercise Activities and Dietary recommendations Goals    None      Immunization History  Administered Date(s) Administered  . Td 11/10/1991    Health Maintenance  Topic Date Due  . Hepatitis C Screening  [birth year]  . HIV Screening  12/24/1971  . TETANUS/TDAP  11/09/2001  . INFLUENZA VACCINE  06/09/2017  . MAMMOGRAM  02/23/2018  . PAP SMEAR  12/15/2018  . COLONOSCOPY  02/10/2026     Discussed health benefits of physical activity, and encouraged her to engage in regular exercise appropriate for her age and condition.    --------------------------------------------------------------------    Vernie Murders, [place]

## 2017-05-18 ENCOUNTER — Other Ambulatory Visit: Payer: Self-pay | Admitting: Family Medicine

## 2017-05-18 DIAGNOSIS — I10 Essential (primary) hypertension: Secondary | ICD-10-CM

## 2017-05-18 NOTE — Telephone Encounter (Signed)
Elizabeth Hogan faxed a request on the following medication. Thanks CC  triamterene-hydrochlorothiazide (MAXZIDE) 75-50 MG tablet [ >Take 1 tablet by mouth once daily.

## 2017-05-19 ENCOUNTER — Other Ambulatory Visit: Payer: Self-pay | Admitting: Family Medicine

## 2017-05-19 NOTE — Telephone Encounter (Signed)
Pt contacted office for refill request on the following medications:  gabapentin (NEURONTIN) 800 MG tablet.  [place]  CB#[phone]/MW

## 2017-05-20 NOTE — Telephone Encounter (Signed)
Looks like Dr. Venia Minks had her on Neurontin 300 mg TID for a suspected trigeminal neuralgia in 2012. Need follow up visit to see if this is returning. If this has been persistent, who has refilled it for the past 6 years? Don't see a record of refill since 2012.

## 2017-05-21 ENCOUNTER — Other Ambulatory Visit: Payer: Self-pay

## 2017-05-21 NOTE — Telephone Encounter (Signed)
Patient advised she would need a OV and labs done before medications could be refilled. Also advised patient that BP medication was only approved for 30 tablets with no refills until OV.  Patient was not willing to schedule a follow up appointment at that time and states "its to early in the morning to deal with this right now."

## 2017-05-21 NOTE — Telephone Encounter (Signed)
[place] to see when last refill on Gabapentin 800 mg was. Juanda Crumble- pharmacist states last refill was done on 03/27/2017 and prescribed by [facility] in [place]. Simona Huh advised.

## 2017-05-24 NOTE — Telephone Encounter (Signed)
Nikki, Did you mean for this to come to me? Looks like it was completed.

## 2017-05-24 NOTE — Telephone Encounter (Signed)
No sorry.

## 2017-05-27 ENCOUNTER — Encounter: Payer: Self-pay | Admitting: Physician Assistant

## 2017-05-27 ENCOUNTER — Ambulatory Visit (INDEPENDENT_AMBULATORY_CARE_PROVIDER_SITE_OTHER): Payer: PPO | Admitting: Physician Assistant

## 2017-05-27 VITALS — BP 124/72 | HR 62 | Temp 97.9°F | Resp 14 | Ht 70.0 in | Wt 196.0 lb

## 2017-05-27 DIAGNOSIS — Z1231 Encounter for screening mammogram for malignant neoplasm of breast: Secondary | ICD-10-CM

## 2017-05-27 DIAGNOSIS — Z7251 High risk heterosexual behavior: Secondary | ICD-10-CM

## 2017-05-27 DIAGNOSIS — F1410 Cocaine abuse, uncomplicated: Secondary | ICD-10-CM

## 2017-05-27 DIAGNOSIS — F17200 Nicotine dependence, unspecified, uncomplicated: Secondary | ICD-10-CM | POA: Diagnosis not present

## 2017-05-27 DIAGNOSIS — Z1239 Encounter for other screening for malignant neoplasm of breast: Secondary | ICD-10-CM

## 2017-05-27 DIAGNOSIS — Z720 Tobacco use: Secondary | ICD-10-CM | POA: Diagnosis not present

## 2017-05-27 DIAGNOSIS — Z0000 Encounter for general adult medical examination without abnormal findings: Secondary | ICD-10-CM | POA: Diagnosis not present

## 2017-05-27 DIAGNOSIS — F1411 Cocaine abuse, in remission: Secondary | ICD-10-CM | POA: Insufficient documentation

## 2017-05-27 DIAGNOSIS — F418 Other specified anxiety disorders: Secondary | ICD-10-CM | POA: Diagnosis not present

## 2017-05-27 DIAGNOSIS — R21 Rash and other nonspecific skin eruption: Secondary | ICD-10-CM

## 2017-05-27 MED ORDER — NICOTINE 14 MG/24HR TD PT24: 14.0000 mg | MEDICATED_PATCH | Freq: Every day | TRANSDERMAL | 0 refills | Status: AC

## 2017-05-27 MED ORDER — SERTRALINE HCL 50 MG PO TABS: 50.0000 mg | ORAL_TABLET | Freq: Every day | ORAL | 3 refills | Status: DC

## 2017-05-27 NOTE — Progress Notes (Signed)
Patient: Elizabeth Hogan, Female    DOB: December 24, 1955, 61 y.o.   MRN: [id number] Visit Date: 05/27/2017  Today's Provider: Trinna Post, PA-C   Chief Complaint  Patient presents with  . Annual Exam   Subjective:    Annual physical exam DHWANI VENKATESH is a 61 y.o. female who presents today for health maintenance and complete physical. She feels fairly well. She reports not doing any exercise on regular bases. She reports she is sleeping well.  Last mammogram 02/25/16, normal Colonoscopy 02/11/16 hyperplastic polyps, repeat 10 years BMD-never. PAP: 2017, negative TDap: due today  She lives in [place], [place] with her mother. She is not employed, stop working after back injury years ago. She is in a relationship with a female, ambivalent about whether or not it's supportive. Thinks her partner may be unfaithful, requesting to be tested for all STIs today.  She drinks three beers daily. She smokes 1/3 pack of cigarettes daily. She snorts cocaine. This has been going on for one year. She has used cocaine remotely in past. Does not inject. Does not use other drugs. She is the sole caregiver for her mother, who lives with her currently. This is stressful for her, reports her mother has history with alcoholism, really isn't doing anything for herself. Patient has two siblings that live in [place] that do not help with daily care. She is debating whether or not to transfer her mother to a nursing home.  Also had granddaughter living with her at one point, and also has been watching three grandchildren, which has been stressful for her. She has thought about support groups like [facility], but really feels that if she can get her living situation together, this will all improve.  Has history of HTN, taking BP medications without issue.  Reporting rash today that is itchy, comes and goes.   Had stopped taking zoloft months ago. Still has baseline anxiety.   She is unaware that  she had problems with her thyroid. She is unaware of Synthroid prescription.  Sees neurologist for trigeminal neuralgia, gets gabapentin from him. Used to get Pamelor, but is not currently taking right now.  Review of Systems  Constitutional: Positive for diaphoresis.  Musculoskeletal: Positive for back pain.  Skin:       Itching arms, neck, break out.    Social History      She  reports that she has been smoking Cigarettes.  She has a 11.25 pack-year smoking history. She has never used smokeless tobacco. She reports that she drinks alcohol. She reports that she uses drugs, including Cocaine.       Social History   Social History  . Marital status: Single    Spouse name: N/A  . Number of children: N/A  . Years of education: N/A   Social History Main Topics  . Smoking status: Current Every Day Smoker    Packs/day: 0.25    Years: 45.00    Types: Cigarettes  . Smokeless tobacco: Never Used  . Alcohol use Yes     Comment: 2-3 beers 2-3 times a day  . Drug use: Yes    Types: Cocaine     Comment: current use.  Marland Kitchen Sexual activity: Yes    Birth control/ protection: None     Comment: 1 pack lasts pt 3-4 days   Other Topics Concern  . None   Social History Narrative  . None    Past Medical History:  Diagnosis  Date  . Anxiety   . Hypertension      Patient Active Problem List   Diagnosis Date Noted  . Special screening for malignant neoplasms, colon   . Benign neoplasm of transverse colon   . Benign neoplasm of descending colon   . Allergic rhinitis 09/02/2015  . Acid reflux 09/02/2015  . 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 Tobacco use disorder 09/02/2015  . 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 Anxiety 09/02/2015  . Essential (primary) hypertension 08/04/2005    Past Surgical History:  Procedure Laterality Date  . ABDOMINAL HYSTERECTOMY  2005   still has cervix  . COLONOSCOPY WITH PROPOFOL N/A 02/11/2016   Procedure: COLONOSCOPY WITH PROPOFOL;   Surgeon: Lucilla Lame, MD;  Location: [facility] ENDOSCOPY;  Service: Endoscopy;  Laterality: N/A;  . LAMINECTOMY  07/2010   Secondary to Job injury in 2010, Subsequent nerve damage and [facility]  . TUBAL LIGATION      Family History        Family Status  Relation Status  . Mother Alive  . Father Deceased at age 59's  . Brother Deceased at age 21's       cause of death was kidney failure with attempted transplant  . Sister Alive  . Brother Alive  . Cousin (Not Specified)        Her family history includes Alzheimer's disease in her father; Arthritis in her mother; Breast cancer (age of onset: 14) in her cousin; Diabetes in her father; Gout in her mother; Hypertension in her brother, father, and mother.     Allergies  Allergen Reactions  . Codeine Sulfate Itching and Rash     Current Outpatient Prescriptions:  .  amLODipine (NORVASC) 2.5 MG tablet, Take 1 tablet (2.5 mg total) by mouth daily., Disp: 90 tablet, Rfl: 1 .  celecoxib (CELEBREX) 100 MG capsule, Take 1 capsule (100 mg total) by mouth daily., Disp: 30 capsule, Rfl: 6 .  chlorzoxazone (PARAFON) 500 MG tablet, , Disp: , Rfl:  .  gabapentin (NEURONTIN) 300 MG capsule, Take 300 mg by mouth 3 (three) times daily. , Disp: , Rfl:  .  nortriptyline (PAMELOR) 50 MG capsule, Take 50 mg by mouth at bedtime. , Disp: , Rfl:  .  omeprazole (PRILOSEC) 20 MG capsule, Take 20 mg by mouth daily., Disp: , Rfl:  .  triamterene-hydrochlorothiazide (MAXZIDE) 75-50 MG tablet, TAKE ONE TABLET BY MOUTH ONCE DAILY, Disp: 30 tablet, Rfl: 0 .  levothyroxine (SYNTHROID, LEVOTHROID) 75 MCG tablet, Take 1 tablet (75 mcg total) by mouth daily. (Patient not taking: Reported on 05/27/2017), Disp: 90 tablet, Rfl: 3 .  sertraline (ZOLOFT) 100 MG tablet, Take 1 tablet (100 mg total) by mouth daily. (Patient not taking: Reported on 05/27/2017), Disp: 90 tablet, Rfl: 1   Patient Care Team: Chrismon, Vickki Muff, PA as PCP - General (Family  Medicine) Christene Lye, MD (General Surgery)      Objective:   Vitals: BP 124/72   Pulse 62   Temp 97.9 F (36.6 C)   Resp 14   Ht 5\' 10"  (1.778 m)   Wt 196 lb (88.9 kg)   BMI 28.12 kg/m    Vitals:   05/27/17 1424  BP: 124/72  Pulse: 62  Resp: 14  Temp: 97.9 F (36.6 C)  Weight: 196 lb (88.9 kg)  Height: 5\' 10"  (1.778 m)     Physical Exam  Constitutional: She is oriented to person, place, and time. She appears  well-developed and well-nourished.  HENT:  Right Ear: External ear normal.  Left Ear: External ear normal.  Mouth/Throat: Oropharynx is clear and moist. No oropharyngeal exudate.  Neck: Neck supple.  Cardiovascular: Normal rate and regular rhythm.   Pulmonary/Chest: Effort normal and breath sounds normal. Right breast exhibits no inverted nipple, no mass, no nipple discharge, no skin change and no tenderness. Left breast exhibits no inverted nipple, no mass, no nipple discharge, no skin change and no tenderness. Breasts are symmetrical.  Abdominal: Soft. Bowel sounds are normal. She exhibits no distension. There is no tenderness. There is no rebound and no guarding.  Lymphadenopathy:    She has no cervical adenopathy.  Neurological: She is alert and oriented to person, place, and time.  Skin: Skin is warm and dry.  Psychiatric: She has a normal mood and affect. Her behavior is normal.     Depression Screen PHQ 2/9 Scores 05/27/2017 12/16/2015 09/02/2015 09/02/2015  PHQ - 2 Score 0 5 5 3   PHQ- 9 Score 2 10 9  -      Assessment & Plan:  1. Annual physical exam  - Comprehensive metabolic panel - CBC with Differential/Platelet - TSH - Lipid panel  2. High risk sexual behavior  - HIV antibody (with reflex) - Hepatitis c antibody (reflex) - RPR - Chlamydia/Gonococcus/Trichomonas, NAA - HSV(herpes simplex vrs) 1+2 ab-IgG  3. Breast cancer screening  - MM DIGITAL SCREENING BILATERAL; Future  4. Tobacco use disorder   5. Tobacco  abuse  - nicotine (NICODERM CQ) 14 mg/24hr patch; Place 1 patch (14 mg total) onto the skin daily.  Dispense: 42 patch; Refill: 0  6. Depression with anxiety  - sertraline (ZOLOFT) 50 MG tablet; Take 1 tablet (50 mg total) by mouth daily.  Dispense: 30 tablet; Refill: 3  7. Rash  - Ambulatory referral to Dermatology  8. Cocaine abuse, continuous use  Doesn't want to do NA right now.  Return in about 1 year (around 05/27/2018) for CPE.  The entirety of the information documented in the History of Present Illness, Review of Systems and Physical Exam were personally obtained by me. Portions of this information were initially documented by [facility], CMA and reviewed by me for thoroughness and accuracy.           Trinna Post, PA-C  [facility]

## 2017-05-27 NOTE — Patient Instructions (Signed)

## 2017-05-28 LAB — COMPREHENSIVE METABOLIC PANEL
ALT: 19 IU/L (ref 0–32)
AST: 24 IU/L (ref 0–40)
Albumin/Globulin Ratio: 1.7 (ref 1.2–2.2)
Albumin: 4.7 g/dL (ref 3.6–4.8)
Alkaline Phosphatase: 91 IU/L (ref 39–117)
BUN/Creatinine Ratio: 13 (ref 12–28)
BUN: 13 mg/dL (ref 8–27)
Bilirubin Total: 0.4 mg/dL (ref 0.0–1.2)
CO2: 22 mmol/L (ref 20–29)
Calcium: 9.8 mg/dL (ref 8.7–10.3)
Chloride: 104 mmol/L (ref 96–106)
Creatinine, Ser: 0.97 mg/dL (ref 0.57–1.00)
GFR calc Af Amer: 73 mL/min/{1.73_m2} (ref >59)
GFR calc non Af Amer: 64 mL/min/{1.73_m2} (ref >59)
Globulin, Total: 2.7 g/dL (ref 1.5–4.5)
Glucose: 89 mg/dL (ref 65–99)
Potassium: 3.6 mmol/L (ref 3.5–5.2)
Sodium: 142 mmol/L (ref 134–144)
Total Protein: 7.4 g/dL (ref 6.0–8.5)

## 2017-05-28 LAB — CBC WITH DIFFERENTIAL/PLATELET
Basophils Absolute: 0 10*3/uL (ref 0.0–0.2)
Basos: 1 %
EOS (ABSOLUTE): 0.2 10*3/uL (ref 0.0–0.4)
Eos: 3 %
Hematocrit: 37.3 % (ref 34.0–46.6)
Hemoglobin: 12.5 g/dL (ref 11.1–15.9)
Immature Grans (Abs): 0 10*3/uL (ref 0.0–0.1)
Immature Granulocytes: 0 %
Lymphocytes Absolute: 2.2 10*3/uL (ref 0.7–3.1)
Lymphs: 45 %
MCH: 30.6 pg (ref 26.6–33.0)
MCHC: 33.5 g/dL (ref 31.5–35.7)
MCV: 91 fL (ref 79–97)
Monocytes Absolute: 0.2 10*3/uL (ref 0.1–0.9)
Monocytes: 4 %
Neutrophils Absolute: 2.4 10*3/uL (ref 1.4–7.0)
Neutrophils: 47 %
Platelets: 310 10*3/uL (ref 150–379)
RBC: 4.09 x10E6/uL (ref 3.77–5.28)
RDW: 14.2 % (ref 12.3–15.4)
WBC: 5 10*3/uL (ref 3.4–10.8)

## 2017-05-28 LAB — LIPID PANEL
Chol/HDL Ratio: 3.4 ratio (ref 0.0–4.4)
Cholesterol, Total: 226 mg/dL — ABNORMAL HIGH (ref 100–199)
HDL: 67 mg/dL (ref >39)
LDL Calculated: 134 mg/dL — ABNORMAL HIGH (ref 0–99)
Triglycerides: 123 mg/dL (ref 0–149)
VLDL Cholesterol Cal: 25 mg/dL (ref 5–40)

## 2017-05-28 LAB — RPR: RPR Ser Ql: NONREACTIVE

## 2017-05-28 LAB — HIV ANTIBODY (ROUTINE TESTING W REFLEX): HIV Screen 4th Generation wRfx: NONREACTIVE

## 2017-05-28 LAB — HCV COMMENT:

## 2017-05-28 LAB — HEPATITIS C ANTIBODY (REFLEX): HCV Ab: <0.1 s/co ratio (ref 0.0–0.9)

## 2017-05-28 LAB — TSH: TSH: 5.32 u[IU]/mL — ABNORMAL HIGH (ref 0.450–4.500)

## 2017-05-28 LAB — HSV(HERPES SIMPLEX VRS) I + II AB-IGG
HSV 1 Glycoprotein G Ab, IgG: 23.9 index — ABNORMAL HIGH (ref 0.00–0.90)
HSV 2 Glycoprotein G Ab, IgG: 4.98 index — ABNORMAL HIGH (ref 0.00–0.90)

## 2017-05-29 LAB — CHLAMYDIA/GONOCOCCUS/TRICHOMONAS, NAA
Chlamydia by NAA: NEGATIVE
Gonococcus by NAA: NEGATIVE
Trich vag by NAA: NEGATIVE

## 2017-05-29 LAB — PLEASE NOTE

## 2017-05-30 ENCOUNTER — Other Ambulatory Visit: Payer: Self-pay | Admitting: Family Medicine

## 2017-05-30 DIAGNOSIS — I10 Essential (primary) hypertension: Secondary | ICD-10-CM

## 2017-05-31 ENCOUNTER — Telehealth: Payer: Self-pay | Admitting: Physician Assistant

## 2017-05-31 NOTE — Telephone Encounter (Signed)
Tried to contact patient about lab results. Mailbox full. I would like to talk to this patient about her results myself, so if you contact her please come get me.

## 2017-06-01 ENCOUNTER — Telehealth: Payer: Self-pay | Admitting: Family Medicine

## 2017-06-01 ENCOUNTER — Other Ambulatory Visit: Payer: Self-pay | Admitting: Physician Assistant

## 2017-06-01 DIAGNOSIS — R7989 Other specified abnormal findings of blood chemistry: Secondary | ICD-10-CM

## 2017-06-01 DIAGNOSIS — I10 Essential (primary) hypertension: Secondary | ICD-10-CM

## 2017-06-01 DIAGNOSIS — M792 Neuralgia and neuritis, unspecified: Secondary | ICD-10-CM

## 2017-06-01 MED ORDER — GABAPENTIN 300 MG PO CAPS: ORAL_CAPSULE | ORAL | 0 refills | Status: DC

## 2017-06-01 MED ORDER — AMLODIPINE BESYLATE 2.5 MG PO TABS: 2.5000 mg | ORAL_TABLET | Freq: Every day | ORAL | 1 refills | Status: DC

## 2017-06-01 NOTE — Telephone Encounter (Signed)
Had long discussion with patient about test results. Need Free T3 and T4 2/2 elevated TSH. She has 25% CVD risk over next ten years, needs statin. Recommend starting atorvastatin 10 mg. She was also positive for HSV1 and HSV2, went over this with her. Suggest she make follow up visit to talk about thyroid results and HSV results, options for viral suppression. Will refill her blood pressure medication, Celebrex, and gabapentin. Will need to get records from her neurologist regarding gabapentin and muscle relaxers. Patient wanted to call back and schedule appointment herself. Just FYI.

## 2017-06-01 NOTE — Telephone Encounter (Signed)
Pt is returning call.  CB#[phone]/MW

## 2017-06-04 ENCOUNTER — Ambulatory Visit (INDEPENDENT_AMBULATORY_CARE_PROVIDER_SITE_OTHER): Payer: PPO | Admitting: Physician Assistant

## 2017-06-04 ENCOUNTER — Encounter: Payer: Self-pay | Admitting: Physician Assistant

## 2017-06-04 VITALS — BP 122/80 | HR 72 | Temp 98.4°F | Resp 16 | Wt 194.0 lb

## 2017-06-04 DIAGNOSIS — R7989 Other specified abnormal findings of blood chemistry: Secondary | ICD-10-CM

## 2017-06-04 DIAGNOSIS — G6289 Other specified polyneuropathies: Secondary | ICD-10-CM | POA: Diagnosis not present

## 2017-06-04 DIAGNOSIS — E7800 Pure hypercholesterolemia, unspecified: Secondary | ICD-10-CM

## 2017-06-04 DIAGNOSIS — B009 Herpesviral infection, unspecified: Secondary | ICD-10-CM

## 2017-06-04 DIAGNOSIS — R946 Abnormal results of thyroid function studies: Secondary | ICD-10-CM

## 2017-06-04 MED ORDER — VALACYCLOVIR HCL 500 MG PO TABS: 500.0000 mg | ORAL_TABLET | Freq: Every day | ORAL | 0 refills | Status: DC

## 2017-06-04 MED ORDER — ATORVASTATIN CALCIUM 10 MG PO TABS: 10.0000 mg | ORAL_TABLET | Freq: Every day | ORAL | 3 refills | Status: DC

## 2017-06-04 NOTE — Progress Notes (Signed)
Patient: Elizabeth Hogan Female    DOB: 05/31/1956   61 y.o.   MRN: [id number] Visit Date: 06/04/2017  Today's Provider: Trinna Post, PA-C   Chief Complaint  Patient presents with  . Hypothyroidism  . Hyperlipidemia   Subjective:      Elizabeth Hogan is a 61 y/o woman with history of remote back injury and surgery resulting in peripheral neuropathy, hypothyroidism, hyperlipidemia, and recently positive HSV 1 and 2.   She has a history of back injury on the job many years ago followed by surgery that resulted in bilateral lower extremity neuropathy. She had been receiving gabapentin for neuropathy and also Pamelor for sleep from neurologist Dr. Wayland Salinas in [place]. Reports he said she can get thes Rx from PCP since they are not controlled and she may transfer her care if she wishes. Wants to do this and will sign ROI today.  She also has a history of some thyroid issues. Last visit she couldn't remember why she was on Synthroid. Recent TSH was elevated, she needs follow up T3 and T4 today. Denies symptoms.  She has hypercholesterolemia. She has a 25% CVD risk over next 10 years with risk factors being African American race, HTN, and smoking. She is agreeable to starting a statin. Her liver function is normal based on recent CMET.   Recently positive for both HSV1 and HSV2. She is sexually active. She desires daily suppressive therapy. She has not had outbreaks.   Thyroid Problem  Presents for follow-up visit. Symptoms include diarrhea. Patient reports no anxiety, cold intolerance, constipation, depressed mood, diaphoresis, dry skin, fatigue, hair loss, heat intolerance, hoarse voice, leg swelling, menstrual problem, nail problem, palpitations, tremors, visual change, weight gain or weight loss. Her past medical history is significant for hyperlipidemia.  Hyperlipidemia  The problem is uncontrolled. Recent lipid tests were reviewed and are high. Pertinent negatives  include no chest pain, focal weakness, leg pain, myalgias or shortness of breath. She is currently on no antihyperlipidemic treatment.       Allergies  Allergen Reactions  . Codeine Sulfate Itching and Rash     Current Outpatient Prescriptions:  .  amLODipine (NORVASC) 2.5 MG tablet, Take 1 tablet (2.5 mg total) by mouth daily., Disp: 90 tablet, Rfl: 1 .  celecoxib (CELEBREX) 100 MG capsule, Take 1 capsule (100 mg total) by mouth daily., Disp: 30 capsule, Rfl: 6 .  chlorzoxazone (PARAFON) 500 MG tablet, , Disp: , Rfl:  .  gabapentin (NEURONTIN) 300 MG capsule, Take one capsule day 1. Two capsules on day 2. Three capsules on day 3 and on., Disp: 90 capsule, Rfl: 0 .  nicotine (NICODERM CQ) 14 mg/24hr patch, Place 1 patch (14 mg total) onto the skin daily., Disp: 42 patch, Rfl: 0 .  nortriptyline (PAMELOR) 75 MG capsule, Take 75 mg by mouth at bedtime., Disp: , Rfl:  .  omeprazole (PRILOSEC) 20 MG capsule, Take 20 mg by mouth daily., Disp: , Rfl:  .  sertraline (ZOLOFT) 50 MG tablet, Take 1 tablet (50 mg total) by mouth daily., Disp: 30 tablet, Rfl: 3 .  triamterene-hydrochlorothiazide (MAXZIDE) 75-50 MG tablet, TAKE 1 TABLET BY MOUTH ONCE DAILY. CANNOT CONTINUE TO PRESCRIBE WITHOUT FOLLOW-UP EXAM AND LABS, Disp: 90 tablet, Rfl: 3  Review of Systems  Constitutional: Negative.  Negative for diaphoresis, fatigue, weight gain and weight loss.  HENT: Negative for hoarse voice, sore throat and trouble swallowing.   Respiratory: Negative.  Negative  for shortness of breath.   Cardiovascular: Negative.  Negative for chest pain and palpitations.  Gastrointestinal: Positive for diarrhea. Negative for abdominal distention, abdominal pain, anal bleeding, blood in stool, constipation, nausea, rectal pain and vomiting.  Endocrine: Negative.  Negative for cold intolerance and heat intolerance.  Genitourinary: Negative for menstrual problem.  Musculoskeletal: Negative for myalgias.  Neurological:  Negative for dizziness, tremors, focal weakness, light-headedness and headaches.  Psychiatric/Behavioral: The patient is not nervous/anxious.     Social History  Substance Use Topics  . Smoking status: Current Every Day Smoker    Packs/day: 0.25    Years: 45.00    Types: Cigarettes  . Smokeless tobacco: Never Used  . Alcohol use Yes     Comment: 2-3 beers 2-3 times a day   Objective:   BP 122/80 (BP Location: Left Arm, Patient Position: Sitting, Cuff Size: Large)   Pulse 72   Temp 98.4 F (36.9 C) (Oral)   Resp 16   Wt 194 lb (88 kg)   BMI 27.84 kg/m  Vitals:   06/04/17 1011  BP: 122/80  Pulse: 72  Resp: 16  Temp: 98.4 F (36.9 C)  TempSrc: Oral  Weight: 194 lb (88 kg)     Physical Exam  Constitutional: She is oriented to person, place, and time. She appears well-developed and well-nourished.  Cardiovascular: Normal rate and regular rhythm.   Pulmonary/Chest: Effort normal and breath sounds normal.  Neurological: She is alert and oriented to person, place, and time.  Skin: Skin is warm and dry.  Psychiatric: She has a normal mood and affect. Her behavior is normal.  Vitals reviewed.       Assessment & Plan:     1. Elevated TSH  Followup with T3 and T4, may need synthroid.  2. Hypercholesterolemia  Agreeable to start statin today. Counseled on side effects. Will recheck in 6 months.  - atorvastatin (LIPITOR) 10 MG tablet; Take 1 tablet (10 mg total) by mouth at bedtime.  Dispense: 90 tablet; Refill: 3  3. Other polyneuropathy  Refilled gabapentin. Submitting ROI to Dr. Wayland Salinas for records on treatment with gabapentin and Pamelor.  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 Counseled on using condoms with all sexual encounters, avoiding sex during outbreaks, dental dam for oral sex.   - valACYclovir (VALTREX) 500 MG tablet; Take 1 tablet (500 mg total) by mouth daily.  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  Dispense: 90 tablet; Refill: 0  Return in about 6 months (around 12/05/2017) for HLD, HSV.  The entirety of the information documented in the History of Present Illness, Review of Systems and Physical Exam were personally obtained by me. Portions of this information were initially documented by Ashley Royalty, CMA and reviewed by me for thoroughness and accuracy.        Trinna Post, PA-C  [facility]

## 2017-06-04 NOTE — Patient Instructions (Signed)
Genital Herpes Genital herpes is a common sexually transmitted infection (STI) that is caused by a virus. The virus spreads from person to person through sexual contact. Infection can cause itching, blisters, and sores around the genitals or rectum. Symptoms may last several days and then go away This is called an outbreak. However, the virus remains in your body, so you may have more outbreaks in the future. The time between outbreaks varies and can be months or years. Genital herpes affects men and women. It is particularly concerning for pregnant women because the virus can be passed to the baby during delivery and can cause serious problems. Genital herpes is also a concern for people who have a weak disease-fighting (immune) system. What are the causes? This condition is caused by the herpes simplex virus (HSV) type 1 or type 2. The virus may spread through:  Sexual contact with an infected person, including vaginal, anal, and oral sex.  Contact with fluid from a herpes sore.  The skin. This means that you can get herpes from an infected partner even if he or she does not have a visible sore or does not know that he or she is infected. What increases the risk? You are more likely to develop this condition if:  You have sex with many partners.  You do not use latex condoms during sex. What are the signs or symptoms? Most people do not have symptoms (asymptomatic) or have mild symptoms that may be mistaken for other skin problems. Symptoms may include:  Small red bumps near the genitals, rectum, or mouth. These bumps turn into blisters and then turn into sores.  Flu-like symptoms, including:  Fever.  Body aches.  Swollen lymph nodes.  Headache.  Painful urination.  Pain and itching in the genital area or rectal area.  Vaginal discharge.  Tingling or shooting pain in the legs and buttocks. Generally, symptoms are more severe and last longer during the first (primary)  outbreak. Flu-like symptoms are also more common during the primary outbreak. How is this diagnosed? Genital herpes may be diagnosed based on:  A physical exam.  Your medical history.  Blood tests.  A test of a fluid sample (culture) from an open sore. How is this treated? There is no cure for this condition, but treatment with antiviral medicines that are taken by mouth (orally) can do the following:  Speed up healing and relieve symptoms.  Help to reduce the spread of the virus to sexual partners.  Limit the chance of future outbreaks, or make future outbreaks shorter.  Lessen symptoms of future outbreaks. Your health care provider may also recommend pain relief medicines, such as aspirin or ibuprofen. Follow these instructions at home: Sexual activity   Do not have sexual contact during active outbreaks.  Practice safe sex. Latex condoms and female condoms may help prevent the spread of the herpes virus. General instructions   Keep the affected areas dry and clean.  Take over-the-counter and prescription medicines only as told by your health care provider.  Avoid rubbing or touching blisters and sores. If you do touch blisters or sores:  Wash your hands thoroughly with soap and water.  Do not touch your eyes afterward.  To help relieve pain or itching, you may take the following actions as directed by your health care provider:  Apply a cold, wet cloth (cold compress) to affected areas 4-6 times a day.  Apply a substance that protects your skin and reduces bleeding (astringent).  Apply a   gel that helps relieve pain around sores (lidocaine gel).  Take a warm, shallow bath that cleans the genital area (sitz bath).  Keep all follow-up visits as told by your health care provider. This is important. How is this prevented?  Use condoms. Although anyone can get genital herpes during sexual contact, even with the use of a condom, a condom can provide some  protection.  Avoid having multiple sexual partners.  Talk with your sexual partner about any symptoms either of you may have. Also, talk with your partner about any history of STIs.  Get tested for STIs before you have sex. Ask your partner to do the same.  Do not have sexual contact if you have symptoms of genital herpes. Contact a health care provider if:  Your symptoms are not improving with medicine.  Your symptoms return.  You have new symptoms.  You have a fever.  You have abdominal pain.  You have redness, swelling, or pain in your eye.  You notice new sores on other parts of your body.  You are a woman and experience bleeding between menstrual periods.  You have had herpes and you become pregnant or plan to become pregnant. Summary  Genital herpes is a common sexually transmitted infection (STI) that is caused by the herpes simplex virus (HSV) type 1 or type 2.  These viruses are most often spread through sexual contact with an infected person.  You are more likely to develop this condition if you have sex with many partners or you have unprotected sex.  Most people do not have symptoms (asymptomatic) or have mild symptoms that may be mistaken for other skin problems. Symptoms occur as outbreaks that may happen months or years apart.  There is no cure for this condition, but treatment with oral antiviral medicines can reduce symptoms, reduce the chance of spreading the virus to a partner, prevent future outbreaks, or shorten future outbreaks. This information is not intended to replace advice given to you by your health care provider. Make sure you discuss any questions you have with your health care provider. Document Released: 10/23/2000 Document Revised: 09/25/2016 Document Reviewed: 09/25/2016 Elsevier Interactive Patient Education  2017 Elsevier Inc.  

## 2017-06-05 LAB — T4, FREE: Free T4: 0.84 ng/dL (ref 0.82–1.77)

## 2017-06-05 LAB — T3, FREE: T3, Free: 2.3 pg/mL (ref 2.0–4.4)

## 2017-06-08 ENCOUNTER — Telehealth: Payer: Self-pay

## 2017-06-08 NOTE — Telephone Encounter (Signed)
Tried calling; voicemail box is full.    Thanks,   -[place]  

## 2017-06-08 NOTE — Telephone Encounter (Signed)
Pt advised.   Thanks,   -Elizabeth Hogan  

## 2017-06-08 NOTE — Telephone Encounter (Signed)
-----   Message from Trinna Post, [place] sent at 06/07/2017  2:45 PM EDT ----- Follow up thyroid labs not indicating treatment yet, but we need to recheck this. Can do it at next visit in 6 mo or earlier if patient feeling sx of hypothyroidism (low energy, swelling, weight gain, cold intolerance).

## 2017-06-09 ENCOUNTER — Other Ambulatory Visit: Payer: Self-pay | Admitting: Physician Assistant

## 2017-06-09 ENCOUNTER — Telehealth: Payer: Self-pay | Admitting: Family Medicine

## 2017-06-09 DIAGNOSIS — G6289 Other specified polyneuropathies: Secondary | ICD-10-CM

## 2017-06-09 MED ORDER — GABAPENTIN 800 MG PO TABS: ORAL_TABLET | ORAL | 0 refills | Status: DC

## 2017-06-09 NOTE — Telephone Encounter (Signed)
Gabapentin addressed in other phone message. Atorvastatin does have GI side effects and generally feeling poorly. As long as she's not having severe leg cramps, she can wait to see if it gets better. If not, we can try lower dose.

## 2017-06-09 NOTE — Progress Notes (Signed)
Patient advised.

## 2017-06-09 NOTE — Progress Notes (Signed)
Called pharmacy and confirmed previous dose gabapentin 800 mg TID from Dr. Marcell Barlow. Sent in new prescription. Take 1/2 pill three times daily to start. Every three days, replace one half pill with a whole pill until taking a whole pill three times daily.

## 2017-06-09 NOTE — Telephone Encounter (Signed)
Patient states that the Atorvastatin is making her feel bad and the gabapentin 300 mg TID is not helping with her pain. Please advise.

## 2017-06-09 NOTE — Telephone Encounter (Signed)
Pt is requesting a call back to discuss the medication that she rec'd Saturday.  Pt states the pain medication is not helping.  CB#[phone]

## 2017-06-21 ENCOUNTER — Telehealth: Payer: Self-pay | Admitting: Family Medicine

## 2017-06-21 NOTE — Telephone Encounter (Signed)
Called office of [facility] and I just got a voicemail that they are out of the office( this message was for last week.)and it said it was going to be opened Monday August 13. No answer. Will try tomorrow.  Thanks,  -Dalores Weger

## 2017-06-21 NOTE — Telephone Encounter (Signed)
No I haven't gotten treatment records yet. Can we call his office at [facility] ([facility] Marcell Barlow [facility]): [phone] and see if he got our fax request for a release of information for treatment notes? Thanks.

## 2017-06-21 NOTE — Telephone Encounter (Signed)
Pt is calling regarding a medication that she was getting from a dr. In [place].  She wants to know if we have talked to him yet.  Pt is confering with Sonia Baller but has never seen her from what she told me.    Pt 's call back is [phone]  Thanks Con Memos

## 2017-06-22 NOTE — Telephone Encounter (Signed)
Called office below, and they report that they were closed last week and they have not checked faxes. Should we get the patient to sign another consent or just fax over request? Fax number is [phone]. Thanks!

## 2017-06-22 NOTE — Telephone Encounter (Signed)
Can we confirm the fax number on the previous ROI and resend that one? Thank you.

## 2017-06-25 NOTE — Telephone Encounter (Signed)
ROI Faxed.    Thanks,   -Mickel Baas

## 2017-07-13 ENCOUNTER — Other Ambulatory Visit: Payer: Self-pay | Admitting: Physician Assistant

## 2017-07-13 DIAGNOSIS — G6289 Other specified polyneuropathies: Secondary | ICD-10-CM

## 2017-07-13 MED ORDER — GABAPENTIN 800 MG PO TABS: ORAL_TABLET | ORAL | 0 refills | Status: DC

## 2017-07-13 NOTE — Telephone Encounter (Signed)
[facility] faxed a request on the following medication. Thanks CC  gabapentin (NEURONTIN) 800 MG tablet  >Start by taking one-half tablet by mouth 3 times daily. Every 3 days replace one-half tablet with a whole tablet until taking one tablet 3 times daily.

## 2017-07-22 ENCOUNTER — Telehealth: Payer: Self-pay

## 2017-07-22 DIAGNOSIS — G6289 Other specified polyneuropathies: Secondary | ICD-10-CM

## 2017-07-22 DIAGNOSIS — M5440 Lumbago with sciatica, unspecified side: Secondary | ICD-10-CM

## 2017-07-22 DIAGNOSIS — G8929 Other chronic pain: Secondary | ICD-10-CM

## 2017-07-22 NOTE — Telephone Encounter (Signed)
Did Pt's records come?  Thanks  -Mickel Baas

## 2017-07-22 NOTE — Telephone Encounter (Signed)
Patient is calling to see if Elizabeth Hogan has spoke with her provider in [place] regarding the Gabapentin, Nortriptyline, and Chlorzoxazone. CB#[phone]

## 2017-07-27 NOTE — Telephone Encounter (Signed)
Not to my knowledge, fax request has been sent multiple times. Telephone: [phone] for neurology and pain specialist. Can we call the office? I can speak with them if necessary. Have made multiple attempts, don't understand delay.

## 2017-07-27 NOTE — Telephone Encounter (Signed)
Pt is calling back wanting to know if we have gotten her records yet.  She says she has given them permission to release the records to [place].  Thank sTeri

## 2017-07-30 DIAGNOSIS — G629 Polyneuropathy, unspecified: Secondary | ICD-10-CM | POA: Insufficient documentation

## 2017-07-30 DIAGNOSIS — M5440 Lumbago with sciatica, unspecified side: Secondary | ICD-10-CM | POA: Insufficient documentation

## 2017-07-30 MED ORDER — NORTRIPTYLINE HCL 75 MG PO CAPS: 75.0000 mg | ORAL_CAPSULE | Freq: Every day | ORAL | 2 refills | Status: DC

## 2017-07-30 MED ORDER — CHLORZOXAZONE 500 MG PO TABS: 500.0000 mg | ORAL_TABLET | Freq: Two times a day (BID) | ORAL | 2 refills | Status: DC | PRN

## 2017-07-30 NOTE — Telephone Encounter (Signed)
We have not received records from Neurology clinic. I have reviewed records in our chart from two years ago to go over her medications and dosages. I have refilled Pamelor 75 mg QHS and the Parafon 500 mg BID PRN. She already has a script from me for gabapentin. Please have her schedule follow up visit in three months so we can reassess.

## 2017-07-30 NOTE — Telephone Encounter (Signed)
Pt is calling back about this again. She is concerned about getting her medications

## 2017-08-03 ENCOUNTER — Ambulatory Visit: Payer: PPO | Admitting: Physician Assistant

## 2017-08-04 ENCOUNTER — Ambulatory Visit (INDEPENDENT_AMBULATORY_CARE_PROVIDER_SITE_OTHER): Payer: PPO | Admitting: Physician Assistant

## 2017-08-04 ENCOUNTER — Encounter: Payer: Self-pay | Admitting: Physician Assistant

## 2017-08-04 VITALS — BP 112/76 | HR 68 | Temp 98.2°F | Resp 16 | Wt 185.0 lb

## 2017-08-04 DIAGNOSIS — N898 Other specified noninflammatory disorders of vagina: Secondary | ICD-10-CM | POA: Diagnosis not present

## 2017-08-04 DIAGNOSIS — K429 Umbilical hernia without obstruction or gangrene: Secondary | ICD-10-CM | POA: Diagnosis not present

## 2017-08-04 DIAGNOSIS — Z7251 High risk heterosexual behavior: Secondary | ICD-10-CM

## 2017-08-04 DIAGNOSIS — A5901 Trichomonal vulvovaginitis: Secondary | ICD-10-CM

## 2017-08-04 DIAGNOSIS — N888 Other specified noninflammatory disorders of cervix uteri: Secondary | ICD-10-CM | POA: Diagnosis not present

## 2017-08-04 DIAGNOSIS — N949 Unspecified condition associated with female genital organs and menstrual cycle: Secondary | ICD-10-CM | POA: Diagnosis not present

## 2017-08-04 DIAGNOSIS — N739 Female pelvic inflammatory disease, unspecified: Secondary | ICD-10-CM

## 2017-08-04 MED ORDER — DOXYCYCLINE HYCLATE 100 MG PO TABS: 100.0000 mg | ORAL_TABLET | Freq: Two times a day (BID) | ORAL | 0 refills | Status: AC

## 2017-08-04 MED ORDER — CEFTRIAXONE SODIUM 500 MG IJ SOLR: 500.0000 mg | Freq: Once | INTRAMUSCULAR | Status: DC

## 2017-08-04 NOTE — Patient Instructions (Signed)
Pelvic Inflammatory Disease Pelvic inflammatory disease (PID) is an infection in some or all of the female organs. PID can be in the uterus, ovaries, fallopian tubes, or the surrounding tissues that are inside the lower belly area (pelvis). PID can lead to lasting problems if it is not treated. To check for this disease, your doctor may:  Do a physical exam.  Do blood tests, urine tests, or a pregnancy test.  Look at your vaginal discharge.  Do tests to look inside the pelvis.  Test you for other infections.  Follow these instructions at home:  Take over-the-counter and prescription medicines only as told by your doctor.  If you were prescribed an antibiotic medicine, take it as told by your doctor. Do not stop taking it even if you start to feel better.  Do not have sex until treatment is done or as told by your doctor.  Tell your sex partner if you have PID. Your partner may need to be treated.  Keep all follow-up visits as told by your doctor. This is important.  Your doctor may test you for infection again 3 months after you are treated. Contact a doctor if:  You have more fluid (discharge) coming from your vagina or fluid that is not normal.  Your pain does not improve.  You throw up (vomit).  You have a fever.  You cannot take your medicines.  Your partner has a sexually transmitted disease (STD).  You have pain when you pee (urinate). Get help right away if:  You have more belly (abdominal) or lower belly pain.  You have chills.  You are not better after 72 hours. This information is not intended to replace advice given to you by your health care provider. Make sure you discuss any questions you have with your health care provider. Document Released: 01/22/2009 Document Revised: 04/02/2016 Document Reviewed: 12/03/2014 Elsevier Interactive Patient Education  [employer].

## 2017-08-04 NOTE — Progress Notes (Addendum)
Patient: Elizabeth Hogan Female    DOB: 03-24-1956   61 y.o.   MRN: [id number] Visit Date: 08/04/2017  Today's Provider: Trinna Post, PA-C   Chief Complaint  Patient presents with  . Umbilical Hernia    Has been there for a long time, but is not painful  . Vaginal Discharge  . Headache   Subjective:      Elizabeth Hogan is a 61 y/o woman with history of HSV1 and HSV2 presenting today for pelvic pain, bloody discharge, and umbilical hernia.  Says she had sex three weeks ago and condom fell off. No sexual activity since then. Has noticed some blood tinged vaginal discharge. Last PAP/HPV in 2017 normal and negative. She's also reporting some pelvic pain. No fevers.  Vaginal Discharge  The patient's primary symptoms include genital itching, vaginal bleeding and vaginal discharge. The patient's pertinent negatives include no genital lesions, genital odor, genital rash, missed menses or pelvic pain. This is a new problem. The current episode started in the past 7 days. The problem occurs constantly. The problem has been unchanged. Associated symptoms include diarrhea and headaches (Has been going on for a week.). Pertinent negatives include no abdominal pain, constipation, dysuria, flank pain, frequency, hematuria, nausea, urgency or vomiting. The vaginal discharge was bloody. The vaginal bleeding is spotting. She is sexually active. She uses condoms (Pt reports the condom fell off) for contraception. She is postmenopausal.  Headache   This is a new problem. The current episode started in the past 7 days. The problem occurs intermittently. The pain is located in the bilateral region. Quality: Pressure. Pertinent negatives include no abdominal pain, dizziness, nausea, phonophobia, photophobia, sinus pressure or vomiting.   HERNIA:  Elizabeth Hogan is an 61 y.o. female who was referred for evaluation of a  Umbilical hernia. Symptoms were first noted several years ago. Symptoms or injury  did not occur at work. Pain is sharp and is reducible. The patient has no symptoms of  chronic constipation, chronic cough, difficulty urinating. There has not previous hx of groin surgery.          Allergies  Allergen Reactions  . Codeine Sulfate Itching and Rash     Current Outpatient Prescriptions:  .  amLODipine (NORVASC) 2.5 MG tablet, Take 1 tablet (2.5 mg total) by mouth daily., Disp: 90 tablet, Rfl: 1 .  atorvastatin (LIPITOR) 10 MG tablet, Take 1 tablet (10 mg total) by mouth at bedtime., Disp: 90 tablet, Rfl: 3 .  celecoxib (CELEBREX) 100 MG capsule, Take 1 capsule (100 mg total) by mouth daily., Disp: 30 capsule, Rfl: 6 .  chlorzoxazone (PARAFON) 500 MG tablet, Take 1 tablet (500 mg total) by mouth 2 (two) times daily as needed for muscle spasms., Disp: 30 tablet, Rfl: 2 .  gabapentin (NEURONTIN) 800 MG tablet, Take 1 whole pill three times daily., Disp: 90 tablet, Rfl: 0 .  nortriptyline (PAMELOR) 75 MG capsule, Take 1 capsule (75 mg total) by mouth at bedtime., Disp: 30 capsule, Rfl: 2 .  omeprazole (PRILOSEC) 20 MG capsule, Take 20 mg by mouth daily., Disp: , Rfl:  .  sertraline (ZOLOFT) 50 MG tablet, Take 1 tablet (50 mg total) by mouth daily., Disp: 30 tablet, Rfl: 3 .  triamterene-hydrochlorothiazide (MAXZIDE) 75-50 MG tablet, TAKE 1 TABLET BY MOUTH ONCE DAILY. CANNOT CONTINUE TO PRESCRIBE WITHOUT FOLLOW-UP EXAM AND LABS, Disp: 90 tablet, Rfl: 3 .  valACYclovir (VALTREX) 500 MG tablet, Take 1 tablet (500  mg total) by mouth daily., Disp: 90 tablet, Rfl: 0  Review of Systems  Constitutional: Negative.   HENT: Negative for sinus pressure.   Eyes: Negative for photophobia.  Respiratory: Negative.   Cardiovascular: Negative.   Gastrointestinal: Positive for diarrhea. Negative for abdominal distention, abdominal pain, anal bleeding, blood in stool, constipation, nausea, rectal pain and vomiting.  Genitourinary: Positive for vaginal bleeding and vaginal discharge. Negative  for decreased urine volume, difficulty urinating, dyspareunia, dysuria, enuresis, flank pain, frequency, genital sores, hematuria, menstrual problem, missed menses, pelvic pain, urgency and vaginal pain.  Neurological: Positive for light-headedness and headaches (Has been going on for a week.). Negative for dizziness.    Social History  Substance Use Topics  . Smoking status: Current Every Day Smoker    Packs/day: 0.25    Years: 45.00    Types: Cigarettes  . Smokeless tobacco: Never Used  . Alcohol use Yes     Comment: 2-3 beers 2-3 times a day   Objective:   BP 112/76 (BP Location: Right Arm, Patient Position: Sitting, Cuff Size: Large)   Pulse 68   Temp 98.2 F (36.8 C) (Oral)   Resp 16   Wt 185 lb (83.9 kg)   BMI 26.54 kg/m  Vitals:   08/04/17 1339  BP: 112/76  Pulse: 68  Resp: 16  Temp: 98.2 F (36.8 C)  TempSrc: Oral  Weight: 185 lb (83.9 kg)     Physical Exam  Constitutional: She is oriented to person, place, and time.  Cardiovascular: Normal rate and regular rhythm.   Pulmonary/Chest: Effort normal and breath sounds normal.  Abdominal: Bowel sounds are normal. She exhibits mass.  Small reducible mass in umbilical region.   Genitourinary: Cervix exhibits motion tenderness, discharge and friability.  Genitourinary Comments: Friable appearing cervix with extreme discomfort arranging speculum as well as collecting sample.   Neurological: She is alert and oriented to person, place, and time.  Skin: Skin is warm and dry.  Psychiatric: She has a normal mood and affect.        Assessment & Plan:     1. Vaginal discharge  Will treat as PID based on clinical findings. Testing as below. F/u in two weeks for cervical check and repeat PAP.  ~G&C Negative, positive for trichomonas. Will need to send in metronidazole and push out her follow up appointment to one month for test of cure, PAP.  - cefTRIAXone (ROCEPHIN) injection 500 mg; Inject 500 mg into the muscle  once. - doxycycline (VIBRA-TABS) 100 MG tablet; Take 1 tablet (100 mg total) by mouth 2 (two) times daily.  Dispense: 28 tablet; Refill: 0 - C. trachomatis/N. gonorrhoeae RNA - SureSwab, T.vaginalis RNA,Ql,Female  2. Friable cervix  - cefTRIAXone (ROCEPHIN) injection 500 mg; Inject 500 mg into the muscle once. - doxycycline (VIBRA-TABS) 100 MG tablet; Take 1 tablet (100 mg total) by mouth 2 (two) times daily.  Dispense: 28 tablet; Refill: 0 - C. trachomatis/N. gonorrhoeae RNA - SureSwab, T.vaginalis RNA,Ql,Female  3. Cervical motion tenderness  - cefTRIAXone (ROCEPHIN) injection 500 mg; Inject 500 mg into the muscle once. - doxycycline (VIBRA-TABS) 100 MG tablet; Take 1 tablet (100 mg total) by mouth 2 (two) times daily.  Dispense: 28 tablet; Refill: 0 - C. trachomatis/N. gonorrhoeae RNA - SureSwab, T.vaginalis RNA,Ql,Female  4. High risk sexual behavior  - HIV antibody (with reflex) - RPR  5. Umbilical hernia without obstruction and without gangrene  - Ambulatory referral to General Surgery  Return in about 2 weeks (around 08/18/2017)  for cervix check, PAP.  The entirety of the information documented in the History of Present Illness, Review of Systems and Physical Exam were personally obtained by me. Portions of this information were initially documented by Ashley Royalty, CMA and reviewed by me for thoroughness and accuracy.         Trinna Post, PA-C  [facility]

## 2017-08-05 ENCOUNTER — Encounter: Payer: Self-pay | Admitting: *Deleted

## 2017-08-05 LAB — RPR: RPR Ser Ql: NONREACTIVE

## 2017-08-05 LAB — C. TRACHOMATIS/N. GONORRHOEAE RNA
C. trachomatis RNA, TMA: NOT DETECTED
N. gonorrhoeae RNA, TMA: NOT DETECTED

## 2017-08-05 LAB — HIV ANTIBODY (ROUTINE TESTING W REFLEX): HIV 1&2 Ab, 4th Generation: NONREACTIVE

## 2017-08-05 LAB — TRICHOMONAS VAGINALIS, PROBE AMP: Trichomonas vaginalis RNA: DETECTED — AB

## 2017-08-05 MED ORDER — METRONIDAZOLE 500 MG PO TABS: 500.0000 mg | ORAL_TABLET | Freq: Two times a day (BID) | ORAL | 0 refills | Status: AC

## 2017-08-05 NOTE — Addendum Note (Signed)
Addended by: Trinna Post on: 08/05/2017 02:14 PM   Modules accepted: Orders

## 2017-08-06 ENCOUNTER — Telehealth: Payer: Self-pay

## 2017-08-06 ENCOUNTER — Telehealth: Payer: Self-pay | Admitting: Physician Assistant

## 2017-08-06 NOTE — Telephone Encounter (Signed)
Pt is requesting results of STD testing

## 2017-08-06 NOTE — Telephone Encounter (Signed)
-----   Message from Trinna Post, [place] sent at 08/05/2017  2:12 PM EDT ----- Gonorrhea and chlamydia negative, HIV and syphilis negative. Came back positive for trichomonas. I am sending in flagyl 500 mg BID x 14 days. We will need to push her follow up appointment out to one month so we can do test of cure two weeks after completing treatment.

## 2017-08-06 NOTE — Telephone Encounter (Signed)
Patient advised.

## 2017-08-17 ENCOUNTER — Ambulatory Visit: Payer: PPO | Admitting: General Surgery

## 2017-08-17 ENCOUNTER — Encounter: Payer: Self-pay | Admitting: General Surgery

## 2017-08-19 ENCOUNTER — Ambulatory Visit: Payer: PPO | Admitting: Physician Assistant

## 2017-08-26 ENCOUNTER — Other Ambulatory Visit: Payer: Self-pay | Admitting: Physician Assistant

## 2017-08-26 DIAGNOSIS — M792 Neuralgia and neuritis, unspecified: Secondary | ICD-10-CM

## 2017-08-27 ENCOUNTER — Ambulatory Visit: Payer: PPO | Admitting: Physician Assistant

## 2017-08-31 ENCOUNTER — Ambulatory Visit: Payer: PPO | Admitting: General Surgery

## 2017-09-07 ENCOUNTER — Ambulatory Visit (INDEPENDENT_AMBULATORY_CARE_PROVIDER_SITE_OTHER): Payer: PPO | Admitting: Physician Assistant

## 2017-09-07 ENCOUNTER — Encounter: Payer: Self-pay | Admitting: Physician Assistant

## 2017-09-07 VITALS — BP 126/68 | HR 84 | Temp 97.9°F | Resp 16 | Wt 189.0 lb

## 2017-09-07 DIAGNOSIS — Z8619 Personal history of other infectious and parasitic diseases: Secondary | ICD-10-CM | POA: Diagnosis not present

## 2017-09-07 DIAGNOSIS — F419 Anxiety disorder, unspecified: Secondary | ICD-10-CM

## 2017-09-07 DIAGNOSIS — K219 Gastro-esophageal reflux disease without esophagitis: Secondary | ICD-10-CM

## 2017-09-07 MED ORDER — ESCITALOPRAM OXALATE 10 MG PO TABS: 10.0000 mg | ORAL_TABLET | Freq: Every day | ORAL | 0 refills | Status: DC

## 2017-09-07 NOTE — Progress Notes (Signed)
Patient: Elizabeth Hogan Female    DOB: 25-Mar-1956   61 y.o.   MRN: [id number] Visit Date: 09/07/2017  Today's Provider: Trinna Post, PA-C   Chief Complaint  Patient presents with  . Exposure to STD   Subjective:     [name] is a 61 y/o woman with history of trichomonas infection on 08/04/2017 as well as depression/anxiety presenting today for follow up. She has completed metronidazole treatment two weeks ago. Reports she has not been sexually active since then. She reports she is not having pelvic pain, fevers, or vaginal discharge.  She is also having continued anxiety. Pharmacist would not fill her zoloft with her nortriptyline. Requesting something else. Has a lot of issues with family, takes   Exposure to STD   The patient's pertinent negatives include no discharge, dyspareunia, dysuria, genital itching, genital lesions, genital rash or pelvic pain. The problem has been resolved. The vaginal discharge was normal. Pertinent negatives include no abdominal pain, anorexia, diaphoresis, fever, genital odor, rectal pain, sore throat or urinary frequency. Risk factors include history of STDs and history of PID.       Allergies  Allergen Reactions  . Codeine Sulfate Itching and Rash     Current Outpatient Prescriptions:  .  amLODipine (NORVASC) 2.5 MG tablet, Take 1 tablet (2.5 mg total) by mouth daily., Disp: 90 tablet, Rfl: 1 .  atorvastatin (LIPITOR) 10 MG tablet, Take 1 tablet (10 mg total) by mouth at bedtime., Disp: 90 tablet, Rfl: 3 .  celecoxib (CELEBREX) 100 MG capsule, Take 1 capsule (100 mg total) by mouth daily., Disp: 30 capsule, Rfl: 6 .  gabapentin (NEURONTIN) 800 MG tablet, Take 1 whole pill three times daily., Disp: 90 tablet, Rfl: 0 .  nortriptyline (PAMELOR) 75 MG capsule, Take 1 capsule (75 mg total) by mouth at bedtime., Disp: 30 capsule, Rfl: 2 .  omeprazole (PRILOSEC) 20 MG capsule, Take 20 mg by mouth daily., Disp: , Rfl:  .   triamterene-hydrochlorothiazide (MAXZIDE) 75-50 MG tablet, TAKE 1 TABLET BY MOUTH ONCE DAILY. CANNOT CONTINUE TO PRESCRIBE WITHOUT FOLLOW-UP EXAM AND LABS, Disp: 90 tablet, Rfl: 3 .  chlorzoxazone (PARAFON) 500 MG tablet, Take 1 tablet (500 mg total) by mouth 2 (two) times daily as needed for muscle spasms. (Patient not taking: Reported on 09/07/2017), Disp: 30 tablet, Rfl: 2 .  sertraline (ZOLOFT) 50 MG tablet, Take 1 tablet (50 mg total) by mouth daily. (Patient not taking: Reported on 09/07/2017), Disp: 30 tablet, Rfl: 3  Current Facility-Administered Medications:  .  cefTRIAXone (ROCEPHIN) injection 500 mg, 500 mg, Intramuscular, Once, Pollak, Adriana M, PA-C  Review of Systems  Constitutional: Negative.  Negative for diaphoresis and fever.  HENT: Negative for sore throat.   Respiratory: Negative.   Cardiovascular: Negative.   Gastrointestinal: Negative.  Negative for abdominal pain, anorexia and rectal pain.  Genitourinary: Negative.  Negative for dyspareunia, dysuria, frequency and pelvic pain.  Neurological: Negative for dizziness, light-headedness and headaches.    Social History  Substance Use Topics  . Smoking status: Current Every Day Smoker    Packs/day: 0.25    Years: 45.00    Types: Cigarettes  . Smokeless tobacco: Never Used  . Alcohol use Yes     Comment: 2-3 beers 2-3 times a day   Objective:   BP 126/68 (BP Location: Right Arm, Patient Position: Sitting, Cuff Size: Large)   Pulse 84   Temp 97.9 F (36.6 C) (Oral)   Resp  16   Wt 189 lb (85.7 kg)   BMI 27.12 kg/m  Vitals:   09/07/17 1348  BP: 126/68  Pulse: 84  Resp: 16  Temp: 97.9 F (36.6 C)  TempSrc: Oral  Weight: 189 lb (85.7 kg)     Physical Exam  Constitutional: She is oriented to person, place, and time. She appears well-developed and well-nourished.  Cardiovascular: Normal rate and regular rhythm.   Pulmonary/Chest: Effort normal and breath sounds normal.  Abdominal: Soft. Bowel sounds are  normal.  Genitourinary: Cervix exhibits no motion tenderness, no discharge and no friability. Right adnexum displays no mass, no tenderness and no fullness. Left adnexum displays no mass, no tenderness and no fullness.  Neurological: She is alert and oriented to person, place, and time.  Skin: Skin is warm and dry.  Psychiatric: She has a normal mood and affect. Her behavior is normal.        Assessment & Plan:     1. History of trichomoniasis  Reswabbed today. Clinically looks improved.   - SureSwab, T.vaginalis RNA,Ql,Female  2. Anxiety  Will start Lexparo for worsening anxiety and see her back in two months to monitor improvement.  - escitalopram (LEXAPRO) 10 MG tablet; Take 1 tablet (10 mg total) by mouth daily.  Dispense: 90 tablet; Refill: 0  Return in about 2 months (around 11/07/2017) for anxiety.  The entirety of the information documented in the History of Present Illness, Review of Systems and Physical Exam were personally obtained by me. Portions of this information were initially documented by Ashley Royalty, CMA and reviewed by me for thoroughness and accuracy.         Trinna Post, PA-C  [facility]

## 2017-09-07 NOTE — Patient Instructions (Signed)

## 2017-09-08 ENCOUNTER — Telehealth: Payer: Self-pay

## 2017-09-08 LAB — TRICHOMONAS VAGINALIS, PROBE AMP: Trichomonas vaginalis RNA: NOT DETECTED

## 2017-09-08 MED ORDER — OMEPRAZOLE 20 MG PO CPDR
20.0000 mg | DELAYED_RELEASE_CAPSULE | Freq: Every day | ORAL | 2 refills | Status: DC
Start: 2017-09-08 — End: 2017-11-07

## 2017-09-08 NOTE — Telephone Encounter (Signed)
-----   Message from Trinna Post, [place] sent at 09/08/2017  4:19 PM EDT ----- Trichomonas test was negative.

## 2017-09-08 NOTE — Telephone Encounter (Signed)
Pt advised of results. 

## 2017-09-08 NOTE — Addendum Note (Signed)
Addended by: Trinna Post on: 09/08/2017 09:15 AM   Modules accepted: Orders

## 2017-09-16 ENCOUNTER — Encounter: Payer: Self-pay | Admitting: *Deleted

## 2017-10-28 ENCOUNTER — Encounter: Payer: Self-pay | Admitting: *Deleted

## 2017-11-04 ENCOUNTER — Ambulatory Visit: Payer: Self-pay

## 2017-11-07 ENCOUNTER — Other Ambulatory Visit: Payer: Self-pay | Admitting: Family Medicine

## 2017-11-07 ENCOUNTER — Other Ambulatory Visit: Payer: Self-pay | Admitting: Physician Assistant

## 2017-11-07 DIAGNOSIS — I10 Essential (primary) hypertension: Secondary | ICD-10-CM

## 2017-11-07 DIAGNOSIS — K219 Gastro-esophageal reflux disease without esophagitis: Secondary | ICD-10-CM

## 2017-11-07 DIAGNOSIS — G6289 Other specified polyneuropathies: Secondary | ICD-10-CM

## 2017-11-08 ENCOUNTER — Other Ambulatory Visit: Payer: Self-pay | Admitting: Physician Assistant

## 2017-11-08 ENCOUNTER — Ambulatory Visit: Payer: PPO | Admitting: Physician Assistant

## 2017-11-08 DIAGNOSIS — B009 Herpesviral infection, unspecified: Secondary | ICD-10-CM

## 2017-11-10 ENCOUNTER — Ambulatory Visit: Payer: PPO | Admitting: Physician Assistant

## 2017-11-12 ENCOUNTER — Ambulatory Visit (INDEPENDENT_AMBULATORY_CARE_PROVIDER_SITE_OTHER): Payer: PPO

## 2017-11-12 VITALS — BP 128/84 | HR 76 | Temp 99.3°F | Ht 70.0 in | Wt 195.8 lb

## 2017-11-12 DIAGNOSIS — Z0000 Encounter for general adult medical examination without abnormal findings: Secondary | ICD-10-CM

## 2017-11-12 NOTE — Progress Notes (Signed)
Subjective:   Elizabeth Hogan is a 62 y.o. female who presents for Medicare Annual (Subsequent) preventive examination.  Review of Systems:  N/A  Cardiac Risk Factors include: advanced age (>55men, >37 women);sedentary lifestyle;smoking/ tobacco exposure;hypertension     Objective:     Vitals: BP 128/84 (BP Location: Left Arm)   Pulse 76   Temp 99.3 F (37.4 C) (Oral)   Ht 5\' 10"  (1.778 m)   Wt 195 lb 12.8 oz (88.8 kg)   BMI 28.09 kg/m   Body mass index is 28.09 kg/m.  Advanced Directives 11/12/2017 05/27/2017 03/16/2016 10/09/2015 10/07/2015  Does Patient Have a Medical Advance Directive? No No No No No  Would patient like information on creating a medical advance directive? No - Patient declined - - Yes - Scientist, clinical (histocompatibility and immunogenetics) given -    Tobacco Social History   Tobacco Use  Smoking Status Current Every Day Smoker  . Packs/day: 0.25  . Years: 45.00  . Pack years: 11.25  . Types: Cigarettes  Smokeless Tobacco Never Used  Tobacco Comment   pt declines quitting at this time     Ready to quit: No Counseling given: Not Answered Comment: pt declines quitting at this time   Clinical Intake:     Pain : No/denies pain Pain Score: 0-No pain     Nutritional Status: BMI 25 -29 Overweight Nutritional Risks: None Diabetes: No  How often do you need to have someone help you when you read instructions, pamphlets, or other written materials from your doctor or pharmacy?: 1 - Never  Interpreter Needed?: No  Information entered by :: [facility], LPN  Past Medical History:  Diagnosis Date  . Anxiety   . Hypertension    Past Surgical History:  Procedure Laterality Date  . ABDOMINAL HYSTERECTOMY  2005   still has cervix  . COLONOSCOPY WITH PROPOFOL N/A 02/11/2016   Procedure: COLONOSCOPY WITH PROPOFOL;  Surgeon: Lucilla Lame, MD;  Location: [facility] ENDOSCOPY;  Service: Endoscopy;  Laterality: N/A;  . LAMINECTOMY  07/2010   Secondary to Job injury in 2010, Subsequent  nerve damage and followed by [facility]  . TUBAL LIGATION     Family History  Problem Relation Age of Onset  . Hypertension Mother   . Arthritis Mother   . Gout Mother   . Diabetes Father   . Alzheimer's disease Father   . Hypertension Father   . Hypertension Brother   . Breast cancer Cousin 40       maternal side   Social History   Socioeconomic History  . Marital status: Single    Spouse name: None  . Number of children: 3  . Years of education: None  . Highest education level: GED or equivalent  Social Needs  . Financial resource strain: Not hard at all  . Food insecurity - worry: Never true  . Food insecurity - inability: Never true  . Transportation needs - medical: No  . Transportation needs - non-medical: No  Occupational History  . Occupation: disability  Tobacco Use  . Smoking status: Current Every Day Smoker    Packs/day: 0.25    Years: 45.00    Pack years: 11.25    Types: Cigarettes  . Smokeless tobacco: Never Used  . Tobacco comment: pt declines quitting at this time  Substance and Sexual Activity  . Alcohol use: Yes    Alcohol/week: 8.4 - 12.6 oz    Types: 14 - 21 Cans of beer per week  . Drug use: Yes  Types: Cocaine    Comment: current use.  Marland Kitchen Sexual activity: Yes    Birth control/protection: None    Comment: 1 pack lasts pt 3-4 days  Other Topics Concern  . None  Social History Narrative   Patients son is deceased. Currently has 1 son and 1 daughter living.     Outpatient Encounter Medications as of 11/12/2017  Medication Sig  . amLODipine (NORVASC) 2.5 MG tablet TAKE 1 TABLET BY MOUTH ONCE DAILY  . atorvastatin (LIPITOR) 10 MG tablet Take 1 tablet (10 mg total) by mouth at bedtime.  . celecoxib (CELEBREX) 100 MG capsule Take 1 capsule (100 mg total) by mouth daily.  . chlorzoxazone (PARAFON) 500 MG tablet Take 1 tablet (500 mg total) by mouth 2 (two) times daily as needed for muscle spasms.  Marland Kitchen escitalopram (LEXAPRO) 10 MG tablet Take 1  tablet (10 mg total) by mouth daily.  Marland Kitchen gabapentin (NEURONTIN) 800 MG tablet TAKE 1 TABLET BY MOUTH THREE TIMES DAILY  . nortriptyline (PAMELOR) 75 MG capsule Take 1 capsule (75 mg total) by mouth at bedtime.  Marland Kitchen omeprazole (PRILOSEC) 20 MG capsule TAKE 1 CAPSULE BY MOUTH ONCE DAILY  . triamterene-hydrochlorothiazide (MAXZIDE) 75-50 MG tablet TAKE 1 TABLET BY MOUTH ONCE DAILY. CANNOT CONTINUE TO PRESCRIBE WITHOUT FOLLOW-UP EXAM AND LABS  . valACYclovir (VALTREX) 500 MG tablet TAKE 1 TABLET BY MOUTH ONCE DAILY  . [DISCONTINUED] cefTRIAXone (ROCEPHIN) injection 500 mg    No facility-administered encounter medications on file as of 11/12/2017.     Activities of Daily Living In your present state of health, do you have any difficulty performing the following activities: 11/12/2017 05/27/2017  Hearing? N N  Vision? Tempie Donning  Comment Pt plans to have an eye exam in 2019.  -  Difficulty concentrating or making decisions? N N  Walking or climbing stairs? N N  Dressing or bathing? N N  Doing errands, shopping? N N  Preparing Food and eating ? N -  Using the Toilet? N -  In the past six months, have you accidently leaked urine? N -  Do you have problems with loss of bowel control? N -  Managing your Medications? N -  Managing your Finances? N -  Housekeeping or managing your Housekeeping? N -  Some recent data might be hidden    Patient Care Team: Chrismon, Vickki Muff, PA as PCP - General (Family Medicine) Paulene Floor as Physician Assistant (Physician Assistant) Robert Bellow, MD (General Surgery)    Assessment:   This is a routine wellness examination for Elizabeth Hogan.  Exercise Activities and Dietary recommendations Current Exercise Habits: The patient does not participate in regular exercise at present, Exercise limited by: Other - see comments(stays busy watching after kids and mother)  Goals    . DIET - INCREASE WATER INTAKE     Recommend increasing water intake to 4-6 glasses a  day.        Fall Risk Fall Risk  11/12/2017 05/27/2017 12/16/2015 09/02/2015  Falls in the past year? No No No No   Is the patient's home free of loose throw rugs in walkways, pet beds, electrical cords, etc?   yes      Grab bars in the bathroom? no, pt does not feel its neccessary at this time.       Handrails on the stairs?   yes      Adequate lighting?   yes  Timed Get Up and Go performed: N/A  Depression Screen  PHQ 2/9 Scores 11/12/2017 05/27/2017 12/16/2015 09/02/2015  PHQ - 2 Score 3 0 5 5  PHQ- 9 Score 3 2 10 9      Cognitive Function: Pt declined screening today.         There is no immunization history for the selected administration types on file for this patient.  Qualifies for Shingles Vaccine? Due for Shingles vaccine. Declined my offer to administer today. Education has been provided regarding the importance of this vaccine. Pt has been advised to call her insurance company to determine her out of pocket expense. Advised she may also receive this vaccine at her local pharmacy or Health Dept. Verbalized acceptance and understanding.  Screening Tests Health Maintenance  Topic Date Due  . TETANUS/TDAP  12/24/1975  . INFLUENZA VACCINE  06/09/2018 (Originally 06/09/2017)  . MAMMOGRAM  02/24/2018  . PAP SMEAR  12/15/2018  . COLONOSCOPY  02/10/2026  . Hepatitis C Screening  Completed  . HIV Screening  Completed    Cancer Screenings: Lung: Low Dose CT Chest recommended if Age 61-80 years, 30 pack-year currently smoking OR have quit w/in 15years. Patient does qualify. Pt would like to speak further about this with PCP.  Breast:  Up to date on Mammogram? Yes   Up to date of Bone Density/Dexa? N/A Colorectal: Up to date  Additional Screenings:  Hepatitis B/HIV/Syphillis: Up to date on HIV screening. Pt declines Hepatitis B and Syphilis labs today.  Hepatitis C Screening: Up to date     Plan:  I have personally reviewed and addressed the Medicare Annual Wellness  questionnaire and have noted the following in the patient's chart:  A. Medical and social history B. Use of alcohol, tobacco or illicit drugs  C. Current medications and supplements D. Functional ability and status E.  Nutritional status F.  Physical activity G. Advance directives H. List of other physicians I.  Hospitalizations, surgeries, and ER visits in previous 12 months J.  [place] such as hearing and vision if needed, cognitive and depression L. Referrals and appointments - none  In addition, I have reviewed and discussed with patient certain preventive protocols, quality metrics, and best practice recommendations. A written personalized care plan for preventive services as well as general preventive health recommendations were provided to patient.  See attached scanned questionnaire for additional information.   Signed,  Fabio Neighbors, LPN Nurse Health Advisor   Nurse Recommendations: Pt would like to follow up with PCP about receiving a CT chest scan since she is smoking. No orders placed today.

## 2017-11-12 NOTE — Patient Instructions (Signed)
Elizabeth Hogan , Thank you for taking time to come for your Medicare Wellness Visit. I appreciate your ongoing commitment to your health goals. Please review the following plan we discussed and let me know if I can assist you in the future.   Screening recommendations/referrals: Colonoscopy: Up to date Mammogram: Up to date Bone Density: N/A Recommended yearly ophthalmology/optometry visit for glaucoma screening and checkup Recommended yearly dental visit for hygiene and checkup  Vaccinations: Influenza vaccine: Pt declines today.  Pneumococcal vaccine: N/A Tdap vaccine: Pt declines today.  Shingles vaccine: N/A    Advanced directives: Advance directive discussed with you today. Even though you declined this today please call our office should you change your mind and we can give you the proper paperwork for you to fill out.  Conditions/risks identified: Recommend increasing water intake to 4-6 glasses a day.   Next appointment: 12/13/17  Preventive Care 40-64 Years, Female Preventive care refers to lifestyle choices and visits with your health care provider that can promote health and wellness. What does preventive care include?  A yearly physical exam. This is also called an annual well check.  Dental exams once or twice a year.  Routine eye exams. Ask your health care provider how often you should have your eyes checked.  Personal lifestyle choices, including:  Daily care of your teeth and gums.  Regular physical activity.  Eating a healthy diet.  Avoiding tobacco and drug use.  Limiting alcohol use.  Practicing safe sex.  Taking low-dose aspirin daily starting at age 28.  Taking vitamin and mineral supplements as recommended by your health care provider. What happens during an annual well check? The services and screenings done by your health care provider during your annual well check will depend on your age, overall health, lifestyle risk factors, and family  history of disease. Counseling  Your health care provider may ask you questions about your:  Alcohol use.  Tobacco use.  Drug use.  Emotional well-being.  Home and relationship well-being.  Sexual activity.  Eating habits.  Work and work Statistician.  Method of birth control.  Menstrual cycle.  Pregnancy history. Screening  You may have the following tests or measurements:  Height, weight, and BMI.  Blood pressure.  Lipid and cholesterol levels. These may be checked every 5 years, or more frequently if you are over 44 years old.  Skin check.  Lung cancer screening. You may have this screening every year starting at age 23 if you have a 30-pack-year history of smoking and currently smoke or have quit within the past 15 years.  Fecal occult blood test (FOBT) of the stool. You may have this test every year starting at age 18.  Flexible sigmoidoscopy or colonoscopy. You may have a sigmoidoscopy every 5 years or a colonoscopy every 10 years starting at age 73.  Hepatitis C blood test.  Hepatitis B blood test.  Sexually transmitted disease (STD) testing.  Diabetes screening. This is done by checking your blood sugar (glucose) after you have not eaten for a while (fasting). You may have this done every 1-3 years.  Mammogram. This may be done every 1-2 years. Talk to your health care provider about when you should start having regular mammograms. This may depend on whether you have a family history of breast cancer.  BRCA-related cancer screening. This may be done if you have a family history of breast, ovarian, tubal, or peritoneal cancers.  Pelvic exam and Pap test. This may be done every 3  years starting at age 3. Starting at age 55, this may be done every 5 years if you have a Pap test in combination with an HPV test.  Bone density scan. This is done to screen for osteoporosis. You may have this scan if you are at high risk for osteoporosis. Discuss your test  results, treatment options, and if necessary, the need for more tests with your health care provider. Vaccines  Your health care provider may recommend certain vaccines, such as:  Influenza vaccine. This is recommended every year.  Tetanus, diphtheria, and acellular pertussis (Tdap, Td) vaccine. You may need a Td booster every 10 years.  Zoster vaccine. You may need this after age 59.  Pneumococcal 13-valent conjugate (PCV13) vaccine. You may need this if you have certain conditions and were not previously vaccinated.  Pneumococcal polysaccharide (PPSV23) vaccine. You may need one or two doses if you smoke cigarettes or if you have certain conditions. Talk to your health care provider about which screenings and vaccines you need and how often you need them. This information is not intended to replace advice given to you by your health care provider. Make sure you discuss any questions you have with your health care provider. Document Released: 11/22/2015 Document Revised: 07/15/2016 Document Reviewed: 08/27/2015 Elsevier Interactive Patient Education  2017 [place] Prevention in the Home Falls can cause injuries. They can happen to people of all ages. There are many things you can do to make your home safe and to help prevent falls. What can I do on the outside of my home?  Regularly fix the edges of walkways and driveways and fix any cracks.  Remove anything that might make you trip as you walk through a door, such as a raised step or threshold.  Trim any bushes or trees on the path to your home.  Use bright outdoor lighting.  Clear any walking paths of anything that might make someone trip, such as rocks or tools.  Regularly check to see if handrails are loose or broken. Make sure that both sides of any steps have handrails.  Any raised decks and porches should have guardrails on the edges.  Have any leaves, snow, or ice cleared regularly.  Use sand or salt on  walking paths during winter.  Clean up any spills in your garage right away. This includes oil or grease spills. What can I do in the bathroom?  Use night lights.  Install grab bars by the toilet and in the tub and shower. Do not use towel bars as grab bars.  Use non-skid mats or decals in the tub or shower.  If you need to sit down in the shower, use a plastic, non-slip stool.  Keep the floor dry. Clean up any water that spills on the floor as soon as it happens.  Remove soap buildup in the tub or shower regularly.  Attach bath mats securely with double-sided non-slip rug tape.  Do not have throw rugs and other things on the floor that can make you trip. What can I do in the bedroom?  Use night lights.  Make sure that you have a light by your bed that is easy to reach.  Do not use any sheets or blankets that are too big for your bed. They should not hang down onto the floor.  Have a firm chair that has side arms. You can use this for support while you get dressed.  Do not have throw rugs  and other things on the floor that can make you trip. What can I do in the kitchen?  Clean up any spills right away.  Avoid walking on wet floors.  Keep items that you use a lot in easy-to-reach places.  If you need to reach something above you, use a strong step stool that has a grab bar.  Keep electrical cords out of the way.  Do not use floor polish or wax that makes floors slippery. If you must use wax, use non-skid floor wax.  Do not have throw rugs and other things on the floor that can make you trip. What can I do with my stairs?  Do not leave any items on the stairs.  Make sure that there are handrails on both sides of the stairs and use them. Fix handrails that are broken or loose. Make sure that handrails are as long as the stairways.  Check any carpeting to make sure that it is firmly attached to the stairs. Fix any carpet that is loose or worn.  Avoid having throw  rugs at the top or bottom of the stairs. If you do have throw rugs, attach them to the floor with carpet tape.  Make sure that you have a light switch at the top of the stairs and the bottom of the stairs. If you do not have them, ask someone to add them for you. What else can I do to help prevent falls?  Wear shoes that:  Do not have high heels.  Have rubber bottoms.  Are comfortable and fit you well.  Are closed at the toe. Do not wear sandals.  If you use a stepladder:  Make sure that it is fully opened. Do not climb a closed stepladder.  Make sure that both sides of the stepladder are locked into place.  Ask someone to hold it for you, if possible.  Clearly mark and make sure that you can see:  Any grab bars or handrails.  First and last steps.  Where the edge of each step is.  Use tools that help you move around (mobility aids) if they are needed. These include:  Canes.  Walkers.  Scooters.  Crutches.  Turn on the lights when you go into a dark area. Replace any light bulbs as soon as they burn out.  Set up your furniture so you have a clear path. Avoid moving your furniture around.  If any of your floors are uneven, fix them.  If there are any pets around you, be aware of where they are.  Review your medicines with your doctor. Some medicines can make you feel dizzy. This can increase your chance of falling. Ask your doctor what other things that you can do to help prevent falls. This information is not intended to replace advice given to you by your health care provider. Make sure you discuss any questions you have with your health care provider. Document Released: 08/22/2009 Document Revised: 04/02/2016 Document Reviewed: 11/30/2014 Elsevier Interactive Patient Education  2017 [employer].

## 2017-11-18 ENCOUNTER — Emergency Department
Admission: EM | Admit: 2017-11-18 | Discharge: 2017-11-18 | Disposition: A | Discharge status: Home / Self Care | Payer: PPO | Attending: Emergency Medicine | Admitting: Emergency Medicine

## 2017-11-18 ENCOUNTER — Other Ambulatory Visit: Payer: Self-pay

## 2017-11-18 ENCOUNTER — Ambulatory Visit (INDEPENDENT_AMBULATORY_CARE_PROVIDER_SITE_OTHER): Payer: PPO | Admitting: Physician Assistant

## 2017-11-18 ENCOUNTER — Encounter: Payer: Self-pay | Admitting: Physician Assistant

## 2017-11-18 ENCOUNTER — Encounter: Payer: Self-pay | Admitting: Emergency Medicine

## 2017-11-18 ENCOUNTER — Ambulatory Visit: Payer: PPO | Admitting: Physician Assistant

## 2017-11-18 ENCOUNTER — Emergency Department: Payer: PPO

## 2017-11-18 VITALS — BP 120/80 | HR 78 | Temp 98.3°F | Resp 16 | Wt 199.6 lb

## 2017-11-18 DIAGNOSIS — Z8639 Personal history of other endocrine, nutritional and metabolic disease: Secondary | ICD-10-CM | POA: Diagnosis not present

## 2017-11-18 DIAGNOSIS — R079 Chest pain, unspecified: Secondary | ICD-10-CM | POA: Diagnosis not present

## 2017-11-18 DIAGNOSIS — R200 Anesthesia of skin: Secondary | ICD-10-CM

## 2017-11-18 DIAGNOSIS — F1410 Cocaine abuse, uncomplicated: Secondary | ICD-10-CM | POA: Diagnosis not present

## 2017-11-18 DIAGNOSIS — F17210 Nicotine dependence, cigarettes, uncomplicated: Secondary | ICD-10-CM | POA: Insufficient documentation

## 2017-11-18 DIAGNOSIS — Z79899 Other long term (current) drug therapy: Secondary | ICD-10-CM | POA: Insufficient documentation

## 2017-11-18 DIAGNOSIS — R51 Headache: Secondary | ICD-10-CM | POA: Insufficient documentation

## 2017-11-18 DIAGNOSIS — I10 Essential (primary) hypertension: Secondary | ICD-10-CM | POA: Diagnosis not present

## 2017-11-18 DIAGNOSIS — R202 Paresthesia of skin: Secondary | ICD-10-CM

## 2017-11-18 DIAGNOSIS — R519 Headache, unspecified: Secondary | ICD-10-CM

## 2017-11-18 LAB — COMPREHENSIVE METABOLIC PANEL
ALBUMIN: 4.5 g/dL (ref 3.5–5.0)
ALK PHOS: 71 U/L (ref 38–126)
ALT: 17 U/L (ref 14–54)
AST: 30 U/L (ref 15–41)
Anion gap: 10 (ref 5–15)
BILIRUBIN TOTAL: 0.7 mg/dL (ref 0.3–1.2)
BUN: 16 mg/dL (ref 6–20)
CALCIUM: 9.6 mg/dL (ref 8.9–10.3)
CO2: 26 mmol/L (ref 22–32)
Chloride: 104 mmol/L (ref 101–111)
Creatinine, Ser: 0.76 mg/dL (ref 0.44–1.00)
GFR calc Af Amer: >60 mL/min (ref >60)
GLUCOSE: 89 mg/dL (ref 65–99)
Potassium: 3.1 mmol/L — ABNORMAL LOW (ref 3.5–5.1)
Sodium: 140 mmol/L (ref 135–145)
Total Protein: 7.8 g/dL (ref 6.5–8.1)

## 2017-11-18 LAB — CBC
HEMATOCRIT: 38.5 % (ref 35.0–47.0)
HEMOGLOBIN: 13.1 g/dL (ref 12.0–16.0)
MCH: 31.4 pg (ref 26.0–34.0)
MCHC: 33.9 g/dL (ref 32.0–36.0)
MCV: 92.7 fL (ref 80.0–100.0)
Platelets: 292 10*3/uL (ref 150–440)
RBC: 4.15 MIL/uL (ref 3.80–5.20)
RDW: 13.8 % (ref 11.5–14.5)
WBC: 4.4 10*3/uL (ref 3.6–11.0)

## 2017-11-18 LAB — DIFFERENTIAL
Basophils Absolute: 0.1 10*3/uL (ref 0–0.1)
Basophils Relative: 1 %
EOS PCT: 4 %
Eosinophils Absolute: 0.2 10*3/uL (ref 0–0.7)
LYMPHS ABS: 1.9 10*3/uL (ref 1.0–3.6)
LYMPHS PCT: 44 %
MONOS PCT: 5 %
Monocytes Absolute: 0.2 10*3/uL (ref 0.2–0.9)
Neutro Abs: 2 10*3/uL (ref 1.4–6.5)
Neutrophils Relative %: 46 %

## 2017-11-18 LAB — TROPONIN I

## 2017-11-18 LAB — PROTIME-INR
INR: 0.81
Prothrombin Time: 11.1 seconds — ABNORMAL LOW (ref 11.4–15.2)

## 2017-11-18 LAB — APTT: aPTT: 30 seconds (ref 24–36)

## 2017-11-18 NOTE — ED Triage Notes (Signed)
Pt to ed with c/o headaches and left sided facial numbness intermittently over the last 2-3 weeks.  Denies facial drooping, no aphasia noted, no obvious unilateral weakness noted at this time. Pt states sent here from MD office for evaluation of possible CVA?

## 2017-11-18 NOTE — Progress Notes (Signed)
Patient: Elizabeth Hogan Female    DOB: 24-Sep-1956   62 y.o.   MRN: [id number] Visit Date: 11/19/2017  Today's Provider: Trinna Post, PA-C   Chief Complaint  Patient presents with  . Numbness   Subjective:    HPI Patient is a 62 y/o woman with known cocaine abuse who  is here today with left sided facial numbness. Symptoms began 2 weeks ago when she last used cocaine. She reports no drooling or dry eyes.Associated symptoms: Tingling on left side of face down to her neck, eye redness, burning headache and soreness on left arm,left eye is watery. She reports that she is not able to see clear from her left eye.  She also reports that she has some bruise on her left leg. She reports that on New Years day she had this severe burning headache that made her very weak that she needed to hold on something and drag her left foot. She promises that she didn't do any drugs that day.  She also reports that she had some chest pressure suddenly today while walking in the living room. No nausea, vomiting, diaphoresis. No radiation down her left arm.     Allergies  Allergen Reactions  . Codeine Itching  . Codeine Sulfate Itching and Rash     Current Outpatient Medications:  .  amLODipine (NORVASC) 2.5 MG tablet, TAKE 1 TABLET BY MOUTH ONCE DAILY, Disp: 30 tablet, Rfl: 0 .  atorvastatin (LIPITOR) 10 MG tablet, Take 1 tablet (10 mg total) by mouth at bedtime., Disp: 90 tablet, Rfl: 3 .  celecoxib (CELEBREX) 100 MG capsule, Take 1 capsule (100 mg total) by mouth daily., Disp: 30 capsule, Rfl: 6 .  chlorzoxazone (PARAFON) 500 MG tablet, Take 1 tablet (500 mg total) by mouth 2 (two) times daily as needed for muscle spasms., Disp: 30 tablet, Rfl: 2 .  escitalopram (LEXAPRO) 10 MG tablet, Take 1 tablet (10 mg total) by mouth daily., Disp: 90 tablet, Rfl: 0 .  gabapentin (NEURONTIN) 800 MG tablet, TAKE 1 TABLET BY MOUTH THREE TIMES DAILY, Disp: 90 tablet, Rfl: 0 .  omeprazole (PRILOSEC) 20 MG  capsule, TAKE 1 CAPSULE BY MOUTH ONCE DAILY, Disp: 30 capsule, Rfl: 0 .  triamterene-hydrochlorothiazide (MAXZIDE) 75-50 MG tablet, TAKE 1 TABLET BY MOUTH ONCE DAILY. CANNOT CONTINUE TO PRESCRIBE WITHOUT FOLLOW-UP EXAM AND LABS, Disp: 90 tablet, Rfl: 3 .  valACYclovir (VALTREX) 500 MG tablet, TAKE 1 TABLET BY MOUTH ONCE DAILY, Disp: 90 tablet, Rfl: 0 .  nortriptyline (PAMELOR) 75 MG capsule, Take 1 capsule (75 mg total) by mouth at bedtime., Disp: 30 capsule, Rfl: 2  Review of Systems  Constitutional: Positive for fatigue.  Eyes: Positive for pain, redness and visual disturbance.  Respiratory: Positive for chest tightness. Negative for shortness of breath.   Cardiovascular: Positive for palpitations (today). Negative for leg swelling. Chest pain: tightness and pressure today.  Neurological: Positive for weakness (left leg), numbness and headaches.  Hematological: Bruises/bleeds easily.    Social History   Tobacco Use  . Smoking status: Current Every Day Smoker    Packs/day: 0.25    Years: 45.00    Pack years: 11.25    Types: Cigarettes  . Smokeless tobacco: Never Used  . Tobacco comment: pt declines quitting at this time  Substance Use Topics  . Alcohol use: Yes    Alcohol/week: 8.4 - 12.6 oz    Types: 14 - 21 Cans of beer per week   Objective:  BP 120/80 (BP Location: Right Arm, Patient Position: Sitting, Cuff Size: Normal)   Pulse 78   Temp 98.3 F (36.8 C) (Oral)   Resp 16   Wt 199 lb 9.6 oz (90.5 kg)   SpO2 96%   BMI 28.64 kg/m    Physical Exam  Constitutional: She is oriented to person, place, and time. She appears well-developed and well-nourished.  Eyes: EOM are normal. Pupils are equal, round, and reactive to light. Left eye exhibits discharge. Right conjunctiva is not injected. Right conjunctiva has no hemorrhage. Left conjunctiva is injected. Left conjunctiva has no hemorrhage.  Cardiovascular: Normal rate and regular rhythm.  Pulmonary/Chest: Effort normal and  breath sounds normal.  Musculoskeletal: She exhibits no edema, tenderness or deformity.  Neurological: She is alert and oriented to person, place, and time. No cranial nerve deficit. Coordination normal.  She is walking with a limp, favoring her left side.  BUE and BLE were 5/5 strength. Facial twitching noted for while performing EKG, Left arm twitching as well.   Skin: Skin is warm and dry.  Psychiatric: She has a normal mood and affect. Her behavior is normal.        Assessment & Plan:     1. Numbness and tingling of left side of face  She reports this has been ongoing for two weeks. However, she continues to reports worsening numbness and tingling on her left side and during her EKG she displayed left sided facial and left sided arm twitching, in addition to chest pain and pressure. DDX: post stroke changes, new ischemic or worsening hemorrhagic stroke, MI. I have directed her to the ER and called EMS since she had no transportation.   2. Cocaine abuse, continuous use (HCC)  No ST changes on EKG 12 lead today.   - EKG 12-Lead  3. Chest pain, unspecified type  - EKG 12-Lead  Return if symptoms worsen or fail to improve.  The entirety of the information documented in the History of Present Illness, Review of Systems and Physical Exam were personally obtained by me. Portions of this information were initially documented by Ashley Royalty, CMA and reviewed by me for thoroughness and accuracy.   I have spent 25 minutes with this patient, >50% of which was spent on counseling and coordination of care.       Trinna Post, PA-C  [facility]

## 2017-11-18 NOTE — Patient Instructions (Signed)
Ischemic Stroke An ischemic stroke (cerebrovascular accident, or CVA) is the sudden death of brain tissue that occurs when an area of the brain does not get enough oxygen. It is a medical emergency that must be treated right away. An ischemic stroke can cause permanent loss of brain function. This can cause problems with how different parts of your body function. What are the causes? This condition is caused by a decrease of oxygen supply to an area of the brain, which may be the result of:  A small blood clot (embolus) or a buildup of plaque in the blood vessels (atherosclerosis) that blocks blood flow in the brain.  An abnormal heart rhythm (atrial fibrillation).  A blocked or damaged artery in the head or neck.  What increases the risk? Certain factors may make you more likely to develop this condition. Some of these factors are things that you can change, such as:  Obesity.  Smoking cigarettes.  Taking oral birth control, especially if you also use tobacco.  Physical inactivity.  Excessive alcohol use.  Use of illegal drugs, especially cocaine and methamphetamine.  Other risk factors include:  High blood pressure (hypertension).  High cholesterol.  Diabetes mellitus.  Heart disease.  Being [place] American, Native American, Hispanic, or [place] Native.  Being over age 40.  Family history of stroke.  Previous history of blood clots, stroke, or transient ischemic attack (TIA).  Sickle cell disease.  Being a woman with a history of preeclampsia.  Migraine headache.  Sleep apnea.  Irregular heartbeats, such as atrial fibrillation.  Chronic inflammatory diseases, such as rheumatoid arthritis or lupus.  Blood clotting disorders (hypercoagulable state).  What are the signs or symptoms? Symptoms of this condition usually develop suddenly, or you may notice them after waking up from sleep. Symptoms may include sudden:  Weakness or numbness in your face, arm, or  leg, especially on one side of your body.  Trouble walking or difficulty moving your arms or legs.  Loss of balance or coordination.  Confusion.  Slurred speech (dysarthria).  Trouble speaking, understanding speech, or both (aphasia).  Vision changes-such as double vision, blurred vision, or loss of vision-inone or both eyes.  Dizziness.  Nausea and vomiting.  Severe headache with no known cause. The headache is often described as the worst headache ever experienced.  If possible, make note of the exact time that you last felt like your normal self and what time your symptoms started. Tell your health care provider. If symptoms come and go, this could be a sign of a warning stroke, or TIA. Get help right away, even if you feel better. How is this diagnosed? This condition may be diagnosed based on:  Your symptoms, your medical history, and a physical exam.  CT scan of the brain.  MRI.  CT angiogram. This test uses a computer to take X-rays of your arteries. A dye may be injected into your blood to show the inside of your blood vessels more clearly.  MRI angiogram. This is a type of MRI that is used to evaluate the blood vessels.  Cerebral angiogram. This test uses X-rays and a dye to show the blood vessels in the brain and neck.  You may need to see a health care provider who specializes in stroke care. A stroke specialist can be seen in person or through communication using telephone or television technology (telemedicine). Other tests may also be done to find the cause of the stroke, such as:  Electrocardiogram (ECG).  Continuous  heart monitoring.  Echocardiogram.  Carotid ultrasound.  A scan of the brain circulation.  Blood tests.  Sleep study to check for sleep apnea.  How is this treated? Treatment for this condition will depend on the duration, severity, and cause of your symptoms and on the area of the brain affected. It is very important to get  treatment at the first sign of stroke symptoms. Some treatments work better if they are done within 3-6 hours of the onset of stroke symptoms. These initial treatments may include:  Aspirin.  Medicines to control blood pressure.  Medicine given by injection to dissolve the blood clot (thrombolytic).  Treatments given directly to the affected artery to remove or dissolve the blood clot.  Other treatment options may include:  Oxygen.  IV fluids.  Medicines to thin the blood (anticoagulants or antiplatelets).  Procedures to increase blood flow.  Medicines and changes to your diet may be used to help treat and manage risk factors for stroke, such as diabetes, high cholesterol, and high blood pressure. After a stroke, you may work with physical, speech, mental health, or occupational therapists to help you recover. Follow these instructions at home: Medicines  Take over-the-counter and prescription medicines only as told by your health care provider.  If you were told to take a medicine to thin your blood, such as aspirin or an anticoagulant, take it exactly as told by your health care provider. ? Taking too much blood-thinning medicine can cause bleeding. ? If you do not take enough blood-thinning medicine, you will not have the protection that you need against another stroke and other problems.  Understand the side effects of taking anticoagulant medicine. When taking this type of medicine, make sure you: ? Hold pressure over any cuts for longer than usual. ? Tell your dentist and other health care providers that you are taking anticoagulants before you have any procedures that may cause bleeding. ? Avoid activities that may cause trauma or injury. Eating and drinking  Follow instructions from your health care provider about diet.  Eat healthy foods.  If your ability to swallow was affected by the stroke, you may need to take steps to avoid choking, such as: ? Taking small  bites when eating. ? Eating foods that are soft or pureed. Safety  Follow instructions from your health care team about physical activity.  Use a walker or cane as told by your health care provider.  Take steps to create a safe home environment in order to reduce the risk of falls. This may include: ? Having your home looked at by specialists. ? Installing grab bars in the bedroom and bathroom. ? Using safety equipment, such as raised toilets and a seat in the shower. General instructions  Do not use any tobacco products, such as cigarettes, chewing tobacco, and e-cigarettes. If you need help quitting, ask your health care provider.  Limit alcohol intake to no more than 1 drink a day for nonpregnant women and 2 drinks a day for men. One drink equals 12 oz of beer, 5 oz of wine, or 1 oz of hard liquor.  If you need help to stop using drugs or alcohol, ask your health care provider about a referral to a program or specialist.  Maintain an active and healthy lifestyle. Get regular exercise as told by your health care provider.  Keep all follow-up visits as told by your health care provider, including visits with all specialists on your health care team. This is important. How  is this prevented? Your risk of another stroke can be decreased by managing high blood pressure, high cholesterol, diabetes, heart disease, sleep apnea, and obesity. It can also be decreased by quitting smoking, limiting alcohol, and staying physically active. Your health care provider will continue to work with you on measures to prevent short-term and long-term complications of stroke. Get help right away if: You have:  Sudden weakness or numbness in your face, arm, or leg, especially on one side of your body.  Sudden confusion.  Sudden trouble speaking, understanding, or both (aphasia).  Sudden trouble seeing with one or both eyes.  Sudden trouble walking or difficulty moving your arms or legs.  Sudden  dizziness.  Sudden loss of balance or coordination.  Sudden, severe headache with no known cause.  A partial or total loss of consciousness.  A seizure. Any of these symptoms may represent a serious problem that is an emergency. Do not wait to see if the symptoms will go away. Get medical help right away. Call your local emergency services (911 in [place] Do not drive yourself to the hospital. This information is not intended to replace advice given to you by your health care provider. Make sure you discuss any questions you have with your health care provider. Document Released: 10/26/2005 Document Revised: 04/07/2016 Document Reviewed: 01/22/2016 Elsevier Interactive Patient Education  2018 [employer]  

## 2017-11-18 NOTE — Discharge Instructions (Signed)
Please seek medical attention for any high fevers, chest pain, shortness of breath, change in behavior, persistent vomiting, bloody stool or any other new or concerning symptoms.  

## 2017-11-18 NOTE — ED Notes (Signed)
Patient verbalized understanding of discharge instructions and follow-up care. Ambulatory to lobby with steady gait and NAD noted. 

## 2017-11-18 NOTE — ED Provider Notes (Signed)
[facility] Emergency Department Provider Note   ____________________________________________   I have reviewed the triage vital signs and the nursing notes.   HISTORY  Chief Complaint Numbness and Headache   History limited by: Not Limited   HPI Elizabeth Hogan is a 62 y.o. female who presents to the emergency department today as directed by primary care physician. The patient has main complaint of headache and left sided facial numbness.   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 For the past few weeks she has had intermittent numbness to the left side of her face and neck. She states that this is accompanied by brief severe generalized headache. Went to PCP today who was concerned for possible CVA. MODIFYING FACTORS: none ASSOCIATED SYMPTOMS: headache.  Per medical record review patient has a history of HTN, anxiety.  Past Medical History:  Diagnosis Date  . Anxiety   . Hypertension     Patient Active Problem List   Diagnosis Date Noted  . Peripheral neuropathy 07/30/2017  . Low back pain with sciatica 07/30/2017  . Elevated TSH 06/01/2017  . Cocaine abuse, continuous use ([place]) 05/27/2017  . Special screening for malignant neoplasms, colon   . Benign neoplasm of transverse colon   . Benign neoplasm of descending colon   . Allergic rhinitis 09/02/2015  . Acid reflux 09/02/2015  . Tobacco use disorder 09/02/2015  . 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 Anxiety 09/02/2015  . Essential (primary) hypertension 08/04/2005    Past Surgical History:  Procedure Laterality Date  . ABDOMINAL HYSTERECTOMY  2005   still has cervix  . COLONOSCOPY WITH PROPOFOL N/A 02/11/2016   Procedure: COLONOSCOPY WITH PROPOFOL;  Surgeon: Lucilla Lame, MD;  Location: [facility] ENDOSCOPY;  Service: Endoscopy;  Laterality: N/A;  . LAMINECTOMY  07/2010   Secondary to Job  injury in 2010, Subsequent nerve damage and followed by [facility]  . TUBAL LIGATION      Prior to Admission medications   Medication Sig Start Date End Date Taking? Authorizing Provider  amLODipine (NORVASC) 2.5 MG tablet TAKE 1 TABLET BY MOUTH ONCE DAILY 11/11/17 12/11/17  Trinna Post, PA-C  atorvastatin (LIPITOR) 10 MG tablet Take 1 tablet (10 mg total) by mouth at bedtime. 06/04/17   Trinna Post, PA-C  celecoxib (CELEBREX) 100 MG capsule Take 1 capsule (100 mg total) by mouth daily. 03/26/17   Chrismon, Vickki Muff, PA  chlorzoxazone (PARAFON) 500 MG tablet Take 1 tablet (500 mg total) by mouth 2 (two) times daily as needed for muscle spasms. 07/30/17   Trinna Post, PA-C  escitalopram (LEXAPRO) 10 MG tablet Take 1 tablet (10 mg total) by mouth daily. 09/07/17 12/06/17  Trinna Post, PA-C  gabapentin (NEURONTIN) 800 MG tablet TAKE 1 TABLET BY MOUTH THREE TIMES DAILY 11/08/17   Chrismon, Vickki Muff, PA  nortriptyline (PAMELOR) 75 MG capsule Take 1 capsule (75 mg total) by mouth at bedtime. 07/30/17 11/12/17  Trinna Post, PA-C  omeprazole (PRILOSEC) 20 MG capsule TAKE 1 CAPSULE BY MOUTH ONCE DAILY 11/11/17 12/11/17  Trinna Post, PA-C  triamterene-hydrochlorothiazide (MAXZIDE) 75-50 MG tablet TAKE 1 TABLET BY MOUTH ONCE DAILY. CANNOT CONTINUE TO PRESCRIBE WITHOUT FOLLOW-UP EXAM AND LABS 05/31/17   Chrismon, Vickki Muff, PA  valACYclovir (VALTREX) 500 MG tablet TAKE 1 TABLET BY MOUTH ONCE DAILY 11/11/17   Trinna Post, PA-C    Allergies Codeine and Codeine sulfate  Family History  Problem Relation Age of Onset  . Hypertension Mother   . Arthritis Mother   . Gout Mother   . Diabetes Father   . Alzheimer's disease Father   . Hypertension Father   . Hypertension Brother   . Breast cancer Cousin 40       maternal side    Social History Social History   Tobacco Use  . Smoking status: Current Every Day Smoker    Packs/day: 0.25    Years: 45.00    Pack years: 11.25     Types: Cigarettes  . Smokeless tobacco: Never Used  . Tobacco comment: pt declines quitting at this time  Substance Use Topics  . Alcohol use: Yes    Alcohol/week: 8.4 - 12.6 oz    Types: 14 - 21 Cans of beer per week  . Drug use: Yes    Types: Cocaine    Comment: current use.    Review of Systems Constitutional: No fever/chills Eyes: No visual changes. ENT: No sore throat. Cardiovascular: Denies chest pain. Respiratory: Denies shortness of breath. Gastrointestinal: No abdominal pain.  No nausea, no vomiting.  No diarrhea.   Genitourinary: Negative for dysuria. Musculoskeletal: Negative for back pain. Skin: Negative for rash. Neurological: Positive for headache. Positive for left facial/neck numbness.  ____________________________________________   PHYSICAL EXAM:  VITAL SIGNS: ED Triage Vitals [11/18/17 1340]  Enc Vitals Group     BP 121/86     Pulse Rate 73     Resp 18     Temp 98.2 F (36.8 C)     Temp Source Oral     SpO2 100 %     Weight 199 lb (90.3 kg)     Height      Head Circumference      Peak Flow      Pain Score 0   Constitutional: Alert and oriented. Well appearing and in no distress. Eyes: Conjunctivae are normal.  ENT   Head: Normocephalic and atraumatic.   Nose: No congestion/rhinnorhea.   Mouth/Throat: Mucous membranes are moist.   Neck: No stridor. Hematological/Lymphatic/Immunilogical: No cervical lymphadenopathy. Cardiovascular: Normal rate, regular rhythm.  No murmurs, rubs, or gallops.  Respiratory: Normal respiratory effort without tachypnea nor retractions. Breath sounds are clear and equal bilaterally. No wheezes/rales/rhonchi. Gastrointestinal: Soft and non tender. No rebound. No guarding.  Genitourinary: Deferred Musculoskeletal: Normal range of motion in all extremities. No lower extremity edema. Neurologic:  Normal speech and language. Face symmetric. Strength 5/5 in upper extremities. Strength 5/5 in right lower, 4/5  in left lower (pt states this is baseline). Sensation intact.  Skin:  Skin is warm, dry and intact. No rash noted. Psychiatric: Mood and affect are normal. Speech and behavior are normal. Patient exhibits appropriate insight and judgment.  ____________________________________________    LABS (pertinent positives/negatives)  CBC wnl CMP k 3.1 otherwise wnl Trop <0.03 ____________________________________________   EKG  I, Nance Pear, attending physician, personally viewed and interpreted this EKG  EKG Time: 1419 Rate: 66 Rhythm: normal sinus rhythm Axis: normal Intervals: qtc 427 QRS: narrow ST changes: no st elevation Impression: normal ekg   ____________________________________________    RADIOLOGY  CT head No acute abnormality   ____________________________________________   PROCEDURES  Procedures  ____________________________________________   INITIAL IMPRESSION / ASSESSMENT AND PLAN / ED COURSE  Pertinent labs & imaging results that were available during my care of the patient were reviewed by me and considered in my medical decision making (see chart for details).  Patient presented to the  emergency department today because of concerns for possible CVA by primary care physician.  Patient complains of intermittent headache with left facial numbness.  At the time my exam the patient denies any numbness or headache.  CT scan was negative.  This has been going on for weeks.  Headache is bilateral thus I think temporal arteritis less likely.  I do wonder if patient is having complex migraines.  At this point given negative workup here in the emergency department and patient being asymptomatic do feel she is safe for continued outpatient workup.  Discussed the findings and plan with patient.   ____________________________________________   FINAL CLINICAL IMPRESSION(S) / ED DIAGNOSES  Final diagnoses:  Bad headache  Numbness     Note: This  dictation was prepared with Dragon dictation. Any transcriptional errors that result from this process are unintentional     Nance Pear, MD 11/18/17 1715

## 2017-11-27 ENCOUNTER — Other Ambulatory Visit: Payer: Self-pay | Admitting: Family Medicine

## 2017-11-27 ENCOUNTER — Other Ambulatory Visit: Payer: Self-pay | Admitting: Physician Assistant

## 2017-11-27 DIAGNOSIS — G8929 Other chronic pain: Secondary | ICD-10-CM

## 2017-11-27 DIAGNOSIS — M5440 Lumbago with sciatica, unspecified side: Principal | ICD-10-CM

## 2017-11-30 ENCOUNTER — Ambulatory Visit
Admission: RE | Admit: 2017-11-30 | Discharge: 2017-11-30 | Disposition: A | Discharge status: Home / Self Care | Payer: PPO | Source: Ambulatory Visit | Attending: Physician Assistant | Admitting: Physician Assistant

## 2017-11-30 DIAGNOSIS — Z1239 Encounter for other screening for malignant neoplasm of breast: Secondary | ICD-10-CM

## 2017-11-30 DIAGNOSIS — Z1231 Encounter for screening mammogram for malignant neoplasm of breast: Secondary | ICD-10-CM | POA: Insufficient documentation

## 2017-12-13 ENCOUNTER — Ambulatory Visit: Payer: PPO | Admitting: Physician Assistant

## 2018-01-05 ENCOUNTER — Other Ambulatory Visit: Payer: Self-pay | Admitting: Family Medicine

## 2018-01-05 DIAGNOSIS — G6289 Other specified polyneuropathies: Secondary | ICD-10-CM

## 2018-01-07 NOTE — Telephone Encounter (Signed)
Needs OV before more refills. 

## 2018-01-28 ENCOUNTER — Other Ambulatory Visit: Payer: Self-pay | Admitting: Physician Assistant

## 2018-01-28 DIAGNOSIS — I10 Essential (primary) hypertension: Secondary | ICD-10-CM

## 2018-02-11 ENCOUNTER — Telehealth: Payer: Self-pay | Admitting: Family Medicine

## 2018-03-04 ENCOUNTER — Other Ambulatory Visit: Payer: Self-pay | Admitting: Physician Assistant

## 2018-03-04 DIAGNOSIS — G6289 Other specified polyneuropathies: Secondary | ICD-10-CM

## 2018-03-04 DIAGNOSIS — K219 Gastro-esophageal reflux disease without esophagitis: Secondary | ICD-10-CM

## 2018-03-09 ENCOUNTER — Other Ambulatory Visit: Payer: Self-pay | Admitting: Physician Assistant

## 2018-03-09 DIAGNOSIS — I10 Essential (primary) hypertension: Secondary | ICD-10-CM

## 2018-03-10 NOTE — Telephone Encounter (Signed)
Needs follow up before more refills

## 2018-03-10 NOTE — Telephone Encounter (Signed)
Pharmacy requesting refills.

## 2018-05-23 ENCOUNTER — Other Ambulatory Visit: Payer: Self-pay | Admitting: Physician Assistant

## 2018-05-23 DIAGNOSIS — K219 Gastro-esophageal reflux disease without esophagitis: Secondary | ICD-10-CM

## 2018-05-23 DIAGNOSIS — I10 Essential (primary) hypertension: Secondary | ICD-10-CM

## 2018-05-23 NOTE — Telephone Encounter (Signed)
She needs to come in for office visit before more refills.

## 2018-05-25 ENCOUNTER — Encounter: Payer: Self-pay | Admitting: Physician Assistant

## 2018-05-25 ENCOUNTER — Ambulatory Visit: Payer: PPO | Admitting: Physician Assistant

## 2018-05-25 ENCOUNTER — Ambulatory Visit (INDEPENDENT_AMBULATORY_CARE_PROVIDER_SITE_OTHER): Payer: PPO | Admitting: Physician Assistant

## 2018-05-25 VITALS — BP 130/100 | HR 81 | Temp 98.5°F | Resp 16 | Wt 186.8 lb

## 2018-05-25 DIAGNOSIS — R197 Diarrhea, unspecified: Secondary | ICD-10-CM | POA: Diagnosis not present

## 2018-05-25 DIAGNOSIS — I10 Essential (primary) hypertension: Secondary | ICD-10-CM

## 2018-05-25 DIAGNOSIS — K219 Gastro-esophageal reflux disease without esophagitis: Secondary | ICD-10-CM

## 2018-05-25 MED ORDER — AMLODIPINE BESYLATE 10 MG PO TABS: 10.0000 mg | ORAL_TABLET | Freq: Every day | ORAL | 0 refills | Status: DC

## 2018-05-25 NOTE — Progress Notes (Signed)
Patient: Elizabeth Hogan Female    DOB: 10/10/1956   62 y.o.   MRN: [id number] Visit Date: 05/26/2018  Today's Provider: Trinna Post, PA-C   Chief Complaint  Patient presents with  . Diarrhea   Subjective:    Diarrhea   This is a new problem. The current episode started in the past 7 days. The problem occurs 2 to 4 times per day. The problem has been unchanged. Associated symptoms include headaches and sweats. Pertinent negatives include no abdominal pain, arthralgias, bloating, chills, coughing, fever, increased  flatus, myalgias, URI, vomiting or weight loss. The symptoms are aggravated by stress. She has tried nothing for the symptoms.    Patient reports that a.c unit in her house is disconnected and she needs a letter of medical necessity written to get assistance at [place].  She also has history of HTN, currently on 2.5 mg amlodipine and Maxzide 75-50 mg QD. However, she says her blood pressure has been running high and she is actually taking 5 mg of amlodipine daily.   Doesn't seem to be taking Lexapro or pamelor anymore.  She says that she is taking omeprazole two times daily and that she needs 60 tablets for one month.   Allergies  Allergen Reactions  . Codeine Itching  . Codeine Sulfate Itching and Rash     Current Outpatient Medications:  .  atorvastatin (LIPITOR) 10 MG tablet, Take 1 tablet (10 mg total) by mouth at bedtime., Disp: 90 tablet, Rfl: 3 .  celecoxib (CELEBREX) 100 MG capsule, TAKE 1 CAPSULE BY MOUTH ONCE DAILY, Disp: 90 capsule, Rfl: 3 .  chlorzoxazone (PARAFON) 500 MG tablet, TAKE 1 TABLET BY MOUTH TWICE DAILY AS NEEDED FOR MUSCLE SPASM, Disp: 30 tablet, Rfl: 2 .  gabapentin (NEURONTIN) 800 MG tablet, TAKE 1 TABLET BY MOUTH THREE TIMES DAILY. FOLLOW UP APPOINTMENT NEEDED TO ASSESS NEUROPATHY STATUS, Disp: 90 tablet, Rfl: 0 .  omeprazole (PRILOSEC) 20 MG capsule, TAKE 1 CAPSULE BY MOUTH ONCE DAILY, Disp: 30 capsule, Rfl: 0 .   triamterene-hydrochlorothiazide (MAXZIDE) 75-50 MG tablet, TAKE 1 TABLET BY MOUTH ONCE DAILY. CANNOT CONTINUE TO PRESCRIBE WITHOUT FOLLOW-UP EXAM AND LABS, Disp: 90 tablet, Rfl: 3 .  valACYclovir (VALTREX) 500 MG tablet, TAKE 1 TABLET BY MOUTH ONCE DAILY, Disp: 90 tablet, Rfl: 0 .  amLODipine (NORVASC) 10 MG tablet, Take 1 tablet (10 mg total) by mouth daily., Disp: 90 tablet, Rfl: 0  Review of Systems  Constitutional: Negative for chills, fever and weight loss.  Respiratory: Negative for cough.   Gastrointestinal: Positive for diarrhea. Negative for abdominal pain, bloating, flatus and vomiting.  Musculoskeletal: Negative for arthralgias and myalgias.  Neurological: Positive for headaches.    Social History   Tobacco Use  . Smoking status: Current Every Day Smoker    Packs/day: 0.25    Years: 45.00    Pack years: 11.25    Types: Cigarettes  . Smokeless tobacco: Never Used  . Tobacco comment: pt declines quitting at this time  Substance Use Topics  . Alcohol use: Yes    Alcohol/week: 8.4 - 12.6 oz    Types: 14 - 21 Cans of beer per week   Objective:   BP (!) 130/100   Pulse 81   Temp 98.5 F (36.9 C) (Oral)   Resp 16   Wt 186 lb 12.8 oz (84.7 kg)   SpO2 98%   BMI 26.80 kg/m  Vitals:   05/25/18 1559  BP: (!) 130/100  Pulse: 81  Resp: 16  Temp: 98.5 F (36.9 C)  TempSrc: Oral  SpO2: 98%  Weight: 186 lb 12.8 oz (84.7 kg)     Physical Exam  Constitutional: She is oriented to person, place, and time. She appears well-developed and well-nourished.  Cardiovascular: Normal rate and regular rhythm.  Pulmonary/Chest: Effort normal and breath sounds normal.  Abdominal: Soft. Bowel sounds are normal. She exhibits no distension. There is no tenderness.  Neurological: She is alert and oriented to person, place, and time.  Skin: Skin is warm and dry.  Psychiatric: She has a normal mood and affect. Her behavior is normal.        Assessment & Plan:     1. Essential  (primary) hypertension  Will increase medication as below.  - amLODipine (NORVASC) 10 MG tablet; Take 1 tablet (10 mg total) by mouth daily.  Dispense: 90 tablet; Refill: 0  2. Gastroesophageal reflux disease without esophagitis  Will refill omeprazole. Counseled patient I only gave 30 pills because I wanted to come back into clinic and she needs to follow up as specified.   3. Diarrhea, unspecified type  Likely viral. Counseled on increased fluids.   Return in about 1 month (around 06/25/2018) for HTN.  The entirety of the information documented in the History of Present Illness, Review of Systems and Physical Exam were personally obtained by me. Portions of this information were initially documented by Thomas Hoff, CMA and reviewed by me for thoroughness and accuracy.           Trinna Post, PA-C  [facility]

## 2018-05-26 NOTE — Patient Instructions (Signed)

## 2018-06-29 ENCOUNTER — Other Ambulatory Visit: Payer: Self-pay | Admitting: Physician Assistant

## 2018-06-29 DIAGNOSIS — E7800 Pure hypercholesterolemia, unspecified: Secondary | ICD-10-CM

## 2018-06-29 DIAGNOSIS — B009 Herpesviral infection, unspecified: Secondary | ICD-10-CM

## 2018-06-29 DIAGNOSIS — K219 Gastro-esophageal reflux disease without esophagitis: Secondary | ICD-10-CM

## 2018-07-01 ENCOUNTER — Ambulatory Visit: Payer: Self-pay | Admitting: Physician Assistant

## 2018-07-04 ENCOUNTER — Other Ambulatory Visit: Payer: Self-pay | Admitting: Physician Assistant

## 2018-07-04 DIAGNOSIS — G6289 Other specified polyneuropathies: Secondary | ICD-10-CM

## 2018-07-04 DIAGNOSIS — K219 Gastro-esophageal reflux disease without esophagitis: Secondary | ICD-10-CM

## 2018-07-04 NOTE — Telephone Encounter (Signed)
Pt contacted office for refill request on the following medications:  1. omeprazole (PRILOSEC) 20 MG capsule 2. gabapentin (NEURONTIN) 800 MG tablet  [facility] [address]  Pt called to see what date her appt is for this week because she hasn't received a reminder call about her appt. Pt was advised she had an appt this past Friday 07/01/18 that she missed. Pt is scheduled for F/U on 07/15/18 which was Adriana's next available appt. Pt stated that she is out of her medications and is requesting enough medication be sent to last her until the appt. Please advise. Thanks TNP

## 2018-07-06 ENCOUNTER — Other Ambulatory Visit: Payer: Self-pay | Admitting: Physician Assistant

## 2018-07-06 DIAGNOSIS — B009 Herpesviral infection, unspecified: Secondary | ICD-10-CM

## 2018-07-06 MED ORDER — OMEPRAZOLE 20 MG PO CPDR: 20.0000 mg | DELAYED_RELEASE_CAPSULE | Freq: Every day | ORAL | 0 refills | Status: DC

## 2018-07-06 MED ORDER — GABAPENTIN 800 MG PO TABS: ORAL_TABLET | ORAL | 0 refills | Status: DC

## 2018-07-06 NOTE — Telephone Encounter (Signed)
[facility] pharmacy faxed a refill request for the following medication. Thanks CC  valACYclovir (VALTREX) 500 MG tablet

## 2018-07-06 NOTE — Telephone Encounter (Signed)
Will give two weeks to get her to the appointment. Then will refill for longer period.

## 2018-07-07 MED ORDER — VALACYCLOVIR HCL 500 MG PO TABS: 500.0000 mg | ORAL_TABLET | Freq: Every day | ORAL | 0 refills | Status: DC

## 2018-07-15 ENCOUNTER — Ambulatory Visit: Payer: PPO | Admitting: Physician Assistant

## 2018-08-12 ENCOUNTER — Ambulatory Visit (INDEPENDENT_AMBULATORY_CARE_PROVIDER_SITE_OTHER): Payer: PPO | Admitting: Physician Assistant

## 2018-08-12 ENCOUNTER — Encounter: Payer: Self-pay | Admitting: Physician Assistant

## 2018-08-12 VITALS — BP 110/70 | HR 90 | Temp 98.0°F | Resp 16 | Wt 187.0 lb

## 2018-08-12 DIAGNOSIS — M5440 Lumbago with sciatica, unspecified side: Secondary | ICD-10-CM | POA: Diagnosis not present

## 2018-08-12 DIAGNOSIS — G8929 Other chronic pain: Secondary | ICD-10-CM

## 2018-08-12 DIAGNOSIS — E7800 Pure hypercholesterolemia, unspecified: Secondary | ICD-10-CM | POA: Diagnosis not present

## 2018-08-12 DIAGNOSIS — G6289 Other specified polyneuropathies: Secondary | ICD-10-CM

## 2018-08-12 DIAGNOSIS — J301 Allergic rhinitis due to pollen: Secondary | ICD-10-CM | POA: Diagnosis not present

## 2018-08-12 DIAGNOSIS — I10 Essential (primary) hypertension: Secondary | ICD-10-CM

## 2018-08-12 MED ORDER — AMLODIPINE BESYLATE 10 MG PO TABS: 10.0000 mg | ORAL_TABLET | Freq: Every day | ORAL | 0 refills | Status: DC

## 2018-08-12 MED ORDER — TRIAMTERENE-HCTZ 75-50 MG PO TABS: 1.0000 | ORAL_TABLET | Freq: Every day | ORAL | 0 refills | Status: DC

## 2018-08-12 MED ORDER — ATORVASTATIN CALCIUM 10 MG PO TABS: 10.0000 mg | ORAL_TABLET | Freq: Every day | ORAL | 0 refills | Status: DC

## 2018-08-12 MED ORDER — CHLORZOXAZONE 500 MG PO TABS
500.0000 mg | ORAL_TABLET | Freq: Every day | ORAL | 0 refills | Status: AC | PRN
Start: 2018-08-12 — End: 2018-11-10

## 2018-08-12 MED ORDER — FLUTICASONE PROPIONATE 50 MCG/ACT NA SUSP: 2.0000 | Freq: Every day | NASAL | 6 refills | Status: DC

## 2018-08-12 MED ORDER — GABAPENTIN 800 MG PO TABS: ORAL_TABLET | ORAL | 0 refills | Status: DC

## 2018-08-12 MED ORDER — FEXOFENADINE HCL 180 MG PO TABS: 180.0000 mg | ORAL_TABLET | Freq: Every day | ORAL | 1 refills | Status: DC

## 2018-08-12 NOTE — Progress Notes (Signed)
Patient: Elizabeth Hogan Female    DOB: 1956/04/22   62 y.o.   MRN: [id number] Visit Date: 08/23/2018  Today's Provider: Trinna Post, PA-C   Chief Complaint  Patient presents with  . Hypertension   Subjective:    HPI  Hypertension, follow-up:  BP Readings from Last 3 Encounters:  08/12/18 110/70  05/25/18 (!) 130/100  11/18/17 (!) 142/85    She was last seen for hypertension 2 months ago.  BP at that visit was 130/100. Management changes since that visit include increase amlodipine 10 mg daily.. She reports good compliance with treatment. She is not having side effects.  She is not exercising. She is not adherent to low salt diet.   Outside blood pressures are stable. She is experiencing none.  Patient denies chest pain.   Cardiovascular risk factors include hypertension and sedentary lifestyle.  Use of agents associated with hypertension: none.     Weight trend: stable Wt Readings from Last 3 Encounters:  08/12/18 187 lb (84.8 kg)  05/25/18 186 lb 12.8 oz (84.7 kg)  11/18/17 199 lb (90.3 kg)    Current diet: in general, a "healthy" diet    Pain Control: Currently on gabapentin 800 mg TID for back pain. Also takes Parafon forte 500 mg daily. Also takes pamelor 75 mg daily for pain as well. Does well with this regimen.  HLD: Taking 10 mg Lipitor and doing well, no compliants. Denies chest pain.  Lipid Panel     Component Value Date/Time   CHOL 226 (H) 05/27/2017 1528   TRIG 123 05/27/2017 1528   HDL 67 05/27/2017 1528   CHOLHDL 3.4 05/27/2017 1528   LDLCALC 134 (H) 05/27/2017 1528    ------------------------------------------------------------------------      Allergies  Allergen Reactions  . Codeine Itching  . Codeine Sulfate Itching and Rash     Current Outpatient Medications:  .  amLODipine (NORVASC) 10 MG tablet, Take 1 tablet (10 mg total) by mouth daily., Disp: 90 tablet, Rfl: 0 .  atorvastatin (LIPITOR) 10 MG tablet, Take 1  tablet (10 mg total) by mouth at bedtime., Disp: 90 tablet, Rfl: 0 .  celecoxib (CELEBREX) 100 MG capsule, TAKE 1 CAPSULE BY MOUTH ONCE DAILY, Disp: 90 capsule, Rfl: 3 .  gabapentin (NEURONTIN) 800 MG tablet, TAKE 1 TABLET BY MOUTH THREE TIMES DAILY. FOLLOW UP APPOINTMENT NEEDED TO ASSESS NEUROPATHY STATUS, Disp: 180 tablet, Rfl: 0 .  omeprazole (PRILOSEC) 20 MG capsule, Take 1 capsule (20 mg total) by mouth daily., Disp: 14 capsule, Rfl: 0 .  triamterene-hydrochlorothiazide (MAXZIDE) 75-50 MG tablet, Take 1 tablet by mouth daily., Disp: 90 tablet, Rfl: 0 .  valACYclovir (VALTREX) 500 MG tablet, Take 1 tablet (500 mg total) by mouth daily., Disp: 90 tablet, Rfl: 0 .  chlorzoxazone (PARAFON) 500 MG tablet, Take 1 tablet (500 mg total) by mouth daily as needed for muscle spasms., Disp: 90 tablet, Rfl: 0 .  fexofenadine (ALLEGRA ALLERGY) 180 MG tablet, Take 1 tablet (180 mg total) by mouth daily., Disp: 90 tablet, Rfl: 1 .  fluticasone (FLONASE) 50 MCG/ACT nasal spray, Place 2 sprays into both nostrils daily., Disp: 16 g, Rfl: 6 .  nortriptyline (PAMELOR) 75 MG capsule, TAKE 1 CAPSULE BY MOUTH AT BEDTIME, Disp: 30 capsule, Rfl: 2 .  omeprazole (PRILOSEC) 20 MG capsule, TAKE 1 CAPSULE BY MOUTH ONCE DAILY, Disp: 30 capsule, Rfl: 0  Review of Systems  Constitutional: Negative.   Respiratory: Negative.   Cardiovascular: Negative.  Social History   Tobacco Use  . Smoking status: Current Every Day Smoker    Packs/day: 0.25    Years: 45.00    Pack years: 11.25    Types: Cigarettes  . Smokeless tobacco: Never Used  . Tobacco comment: pt declines quitting at this time  Substance Use Topics  . Alcohol use: Yes    Alcohol/week: 14.0 - 21.0 standard drinks    Types: 14 - 21 Cans of beer per week   Objective:   BP 110/70 (BP Location: Left Arm, Patient Position: Sitting, Cuff Size: Large)   Pulse 90   Temp 98 F (36.7 C) (Oral)   Resp 16   Wt 187 lb (84.8 kg)   SpO2 97%   BMI 26.83 kg/m    Vitals:   08/12/18 1235  BP: 110/70  Pulse: 90  Resp: 16  Temp: 98 F (36.7 C)  TempSrc: Oral  SpO2: 97%  Weight: 187 lb (84.8 kg)     Physical Exam  Constitutional: She is oriented to person, place, and time. She appears well-developed and well-nourished.  Cardiovascular: Normal rate and regular rhythm.  Pulmonary/Chest: Effort normal and breath sounds normal.  Abdominal: Soft. Bowel sounds are normal.  Neurological: She is alert and oriented to person, place, and time.  Skin: Skin is warm and dry.  Psychiatric: She has a normal mood and affect. Her behavior is normal.        Assessment & Plan:     1. Allergic rhinitis due to pollen, unspecified seasonality  - fexofenadine (ALLEGRA ALLERGY) 180 MG tablet; Take 1 tablet (180 mg total) by mouth daily.  Dispense: 90 tablet; Refill: 1 - fluticasone (FLONASE) 50 MCG/ACT nasal spray; Place 2 sprays into both nostrils daily.  Dispense: 16 g; Refill: 6  2. Other polyneuropathy  - gabapentin (NEURONTIN) 800 MG tablet; TAKE 1 TABLET BY MOUTH THREE TIMES DAILY. FOLLOW UP APPOINTMENT NEEDED TO ASSESS NEUROPATHY STATUS  Dispense: 180 tablet; Refill: 0  3. Chronic low back pain with sciatica, sciatica laterality unspecified, unspecified back pain laterality  Stable, continue.  - chlorzoxazone (PARAFON) 500 MG tablet; Take 1 tablet (500 mg total) by mouth daily as needed for muscle spasms.  Dispense: 90 tablet; Refill: 0  4. Essential (primary) hypertension  Stable, continue.  - amLODipine (NORVASC) 10 MG tablet; Take 1 tablet (10 mg total) by mouth daily.  Dispense: 90 tablet; Refill: 0 - triamterene-hydrochlorothiazide (MAXZIDE) 75-50 MG tablet; Take 1 tablet by mouth daily.  Dispense: 90 tablet; Refill: 0  5. Hypercholesterolemia  Chronic, stable, labs at next visit.   - atorvastatin (LIPITOR) 10 MG tablet; Take 1 tablet (10 mg total) by mouth at bedtime.  Dispense: 90 tablet; Refill: 0  Return in about 3 months (around  11/12/2018).  The entirety of the information documented in the History of Present Illness, Review of Systems and Physical Exam were personally obtained by me. Portions of this information were initially documented by Lynford Humphrey, CMA and reviewed by me for thoroughness and accuracy.         Trinna Post, PA-C  [facility]

## 2018-08-19 ENCOUNTER — Other Ambulatory Visit: Payer: Self-pay | Admitting: Physician Assistant

## 2018-08-19 DIAGNOSIS — M5440 Lumbago with sciatica, unspecified side: Secondary | ICD-10-CM

## 2018-08-19 DIAGNOSIS — G8929 Other chronic pain: Secondary | ICD-10-CM

## 2018-08-19 DIAGNOSIS — K219 Gastro-esophageal reflux disease without esophagitis: Secondary | ICD-10-CM

## 2018-08-19 DIAGNOSIS — G6289 Other specified polyneuropathies: Secondary | ICD-10-CM

## 2018-08-23 NOTE — Patient Instructions (Signed)

## 2018-11-16 ENCOUNTER — Encounter: Payer: Self-pay | Admitting: Physician Assistant

## 2018-11-16 ENCOUNTER — Other Ambulatory Visit (HOSPITAL_COMMUNITY)
Admission: RE | Admit: 2018-11-16 | Discharge: 2018-11-16 | Disposition: A | Discharge status: Home / Self Care | Payer: PPO | Source: Ambulatory Visit | Attending: Physician Assistant | Admitting: Physician Assistant

## 2018-11-16 ENCOUNTER — Ambulatory Visit: Payer: PPO

## 2018-11-16 ENCOUNTER — Ambulatory Visit (INDEPENDENT_AMBULATORY_CARE_PROVIDER_SITE_OTHER): Payer: PPO | Admitting: Physician Assistant

## 2018-11-16 VITALS — BP 101/73 | HR 84 | Temp 98.2°F | Resp 16 | Ht 70.0 in | Wt 203.0 lb

## 2018-11-16 DIAGNOSIS — F1410 Cocaine abuse, uncomplicated: Secondary | ICD-10-CM

## 2018-11-16 DIAGNOSIS — M5440 Lumbago with sciatica, unspecified side: Secondary | ICD-10-CM | POA: Diagnosis not present

## 2018-11-16 DIAGNOSIS — Z124 Encounter for screening for malignant neoplasm of cervix: Secondary | ICD-10-CM

## 2018-11-16 DIAGNOSIS — I10 Essential (primary) hypertension: Secondary | ICD-10-CM

## 2018-11-16 DIAGNOSIS — F17200 Nicotine dependence, unspecified, uncomplicated: Secondary | ICD-10-CM | POA: Diagnosis not present

## 2018-11-16 DIAGNOSIS — R7989 Other specified abnormal findings of blood chemistry: Secondary | ICD-10-CM

## 2018-11-16 DIAGNOSIS — G6289 Other specified polyneuropathies: Secondary | ICD-10-CM | POA: Diagnosis not present

## 2018-11-16 DIAGNOSIS — E785 Hyperlipidemia, unspecified: Secondary | ICD-10-CM

## 2018-11-16 DIAGNOSIS — G8929 Other chronic pain: Secondary | ICD-10-CM | POA: Diagnosis not present

## 2018-11-16 DIAGNOSIS — Z0000 Encounter for general adult medical examination without abnormal findings: Secondary | ICD-10-CM

## 2018-11-16 MED ORDER — CHLORZOXAZONE 500 MG PO TABS: 500.0000 mg | ORAL_TABLET | Freq: Two times a day (BID) | ORAL | 0 refills | Status: DC | PRN

## 2018-11-16 MED ORDER — TRIAMTERENE-HCTZ 75-50 MG PO TABS: 1.0000 | ORAL_TABLET | Freq: Every day | ORAL | 0 refills | Status: DC

## 2018-11-16 MED ORDER — AMLODIPINE BESYLATE 10 MG PO TABS: 10.0000 mg | ORAL_TABLET | Freq: Every day | ORAL | 0 refills | Status: DC

## 2018-11-16 MED ORDER — NORTRIPTYLINE HCL 75 MG PO CAPS: 75.0000 mg | ORAL_CAPSULE | Freq: Every day | ORAL | 0 refills | Status: DC

## 2018-11-16 MED ORDER — GABAPENTIN 800 MG PO TABS: ORAL_TABLET | ORAL | 0 refills | Status: DC

## 2018-11-16 NOTE — Patient Instructions (Signed)

## 2018-11-16 NOTE — Progress Notes (Signed)
Patient: Elizabeth Hogan, Female    DOB: Oct 31, 1956, 63 y.o.   MRN: [id number] Visit Date: 11/17/2018  Today's Provider: Trinna Post, PA-C   Chief Complaint  Patient presents with  . Medicare Wellness   Subjective:     Complete Physical Elizabeth Hogan is a 63 y.o. female. She feels well. She reports exercising active with daily activities. She reports she is sleeping well. Reports she is going through difficult time right now due to loss of her mother last year and her sister who is very ill and not expected to live.  Mammogram: 11/30/2017 normal Colonoscopy: 02/2016 hyperplastic polyp, repeat 10 years Influenza Shot: Declined  PAP Smear: due today, last done 12/16/2015 normal; patient has history of trichomonas infection and has had unprotected sexual encounter one month ago.  HLD: taking lipitor 10 mg 2-3 nights per week.   Lipid Panel     Component Value Date/Time   CHOL 226 (H) 05/27/2017 1528   TRIG 123 05/27/2017 1528   HDL 67 05/27/2017 1528   CHOLHDL 3.4 05/27/2017 1528   LDLCALC 134 (H) 05/27/2017 1528   HTN: norvasc 10 mg daily, maxzide 75-50 mg  Chronic Back Pain: Takes celebrex, pamelor, neurontin, and paraforn forte. Reports this controls he rpain moderately.   Alcohol Abuse: Reports she has decreased usage to drinking beer 1-2 times per week, mainly due to cost.   Beer 1-2 per week  -----------------------------------------------------------   Review of Systems  Constitutional: Negative.   HENT: Negative.   Eyes: Positive for discharge and itching.  Respiratory: Negative.   Cardiovascular: Negative.   Gastrointestinal: Negative.   Endocrine: Negative.   Genitourinary: Negative.   Musculoskeletal: Positive for back pain.  Skin: Negative.   Allergic/Immunologic: Positive for environmental allergies.  Neurological: Negative.   Hematological: Negative.   Psychiatric/Behavioral: Negative.     Social History   Socioeconomic History  .  Marital status: Single    Spouse name: Not on file  . Number of children: 3  . Years of education: Not on file  . Highest education level: GED or equivalent  Occupational History  . Occupation: disability  Social Needs  . Financial resource strain: Not hard at all  . Food insecurity:    Worry: Never true    Inability: Never true  . Transportation needs:    Medical: No    Non-medical: No  Tobacco Use  . Smoking status: Current Every Day Smoker    Packs/day: 0.25    Years: 45.00    Pack years: 11.25    Types: Cigarettes  . Smokeless tobacco: Never Used  . Tobacco comment: pt declines quitting at this time  Substance and Sexual Activity  . Alcohol use: Yes    Alcohol/week: 14.0 - 21.0 standard drinks    Types: 14 - 21 Cans of beer per week  . Drug use: Yes    Types: Cocaine    Comment: current use.  Marland Kitchen Sexual activity: Yes    Birth control/protection: None    Comment: 1 pack lasts pt 3-4 days  Lifestyle  . Physical activity:    Days per week: Not on file    Minutes per session: Not on file  . Stress: Rather much  Relationships  . Social connections:    Talks on phone: Not on file    Gets together: Not on file    Attends religious service: Not on file    Active member of club or organization: Not  on file    Attends meetings of clubs or organizations: Not on file    Relationship status: Not on file  . Intimate partner violence:    Fear of current or ex partner: Not on file    Emotionally abused: Not on file    Physically abused: Not on file    Forced sexual activity: Not on file  Other Topics Concern  . Not on file  Social History Narrative   Patients son is deceased. Currently has 1 son and 1 daughter living.     Past Medical History:  Diagnosis Date  . Anxiety   . Hypertension      Patient Active Problem List   Diagnosis Date Noted  . Peripheral neuropathy 07/30/2017  . Low back pain with sciatica 07/30/2017  . Elevated TSH 06/01/2017  . Cocaine abuse,  continuous use ([place]) 05/27/2017  . Special screening for malignant neoplasms, colon   . Benign neoplasm of transverse colon   . Benign neoplasm of descending colon   . Allergic rhinitis 09/02/2015  . Acid reflux 09/02/2015  . Tobacco use disorder 09/02/2015  . 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 Anxiety 09/02/2015  . Essential (primary) hypertension 08/04/2005    Past Surgical History:  Procedure Laterality Date  . ABDOMINAL HYSTERECTOMY  2005   still has cervix  . COLONOSCOPY WITH PROPOFOL N/A 02/11/2016   Procedure: COLONOSCOPY WITH PROPOFOL;  Surgeon: Lucilla Lame, MD;  Location: [facility] ENDOSCOPY;  Service: Endoscopy;  Laterality: N/A;  . LAMINECTOMY  07/2010   Secondary to Job injury in 2010, Subsequent nerve damage and followed by [facility]  . TUBAL LIGATION      Her family history includes Alzheimer's disease in her father; Arthritis in her mother; Breast cancer (age of onset: 59) in her cousin; Diabetes in her father; Gout in her mother; Hypertension in her brother, father, and mother.      Current Outpatient Medications:  .  amLODipine (NORVASC) 10 MG tablet, Take 1 tablet (10 mg total) by mouth daily., Disp: 90 tablet, Rfl: 0 .  celecoxib (CELEBREX) 100 MG capsule, TAKE 1 CAPSULE BY MOUTH ONCE DAILY, Disp: 90 capsule, Rfl: 3 .  fluticasone (FLONASE) 50 MCG/ACT nasal spray, Place 2 sprays into both nostrils daily., Disp: 16 g, Rfl: 6 .  gabapentin (NEURONTIN) 800 MG tablet, TAKE 1 TABLET BY MOUTH THREE TIMES DAILY. FOLLOW UP APPOINTMENT NEEDED TO ASSESS NEUROPATHY STATUS, Disp: 270 tablet, Rfl: 0 .  nortriptyline (PAMELOR) 75 MG capsule, Take 1 capsule (75 mg total) by mouth at bedtime., Disp: 90 capsule, Rfl: 0 .  triamterene-hydrochlorothiazide (MAXZIDE) 75-50 MG tablet, Take 1 tablet by mouth daily., Disp: 90 tablet, Rfl: 0 .  valACYclovir (VALTREX) 500 MG tablet, Take 1 tablet (500 mg total) by mouth daily., Disp: 90 tablet, Rfl: 0 .  atorvastatin (LIPITOR) 10 MG tablet, Take 1  tablet (10 mg total) by mouth at bedtime., Disp: 90 tablet, Rfl: 0 .  chlorzoxazone (PARAFON FORTE DSC) 500 MG tablet, Take 1 tablet (500 mg total) by mouth 2 (two) times daily as needed for muscle spasms., Disp: 180 tablet, Rfl: 0  Patient Care Team: Paulene Floor as PCP - General (Physician Assistant) Trinna Post, PA-C as Physician Assistant (Physician Assistant) Robert Bellow, MD (General Surgery)     Objective:   Vitals: BP 101/73 (BP Location: Left Arm, Patient Position: Sitting, Cuff Size: Normal)   Pulse 84   Temp 98.2 F (36.8 C) (Oral)   Resp 16  Ht 5\' 10"  (1.778 m)   Wt 203 lb (92.1 kg)   BMI 29.13 kg/m   Physical Exam Exam conducted with a chaperone present.  HENT:     Right Ear: Tympanic membrane and ear canal normal.     Left Ear: Tympanic membrane and ear canal normal.     Mouth/Throat:     Mouth: Mucous membranes are moist.     Pharynx: Oropharynx is clear.  Neck:     Musculoskeletal: Neck supple.  Cardiovascular:     Rate and Rhythm: Normal rate and regular rhythm.     Heart sounds: Normal heart sounds.  Pulmonary:     Effort: Pulmonary effort is normal.     Breath sounds: Normal breath sounds.  Chest:     Breasts: Breasts are symmetrical.        Right: Normal.        Left: Normal.  Abdominal:     General: Abdomen is flat. Bowel sounds are normal.     Palpations: Abdomen is soft.  Genitourinary:    General: Normal vulva.     Exam position: Lithotomy position.     Pubic Area: No rash.      Labia:        Right: No rash, tenderness, lesion or injury.        Left: No rash, tenderness, lesion or injury.      Vagina: Normal.     Cervix: Normal.     Uterus: Normal.   Skin:    General: Skin is warm and dry.  Neurological:     General: No focal deficit present.     Mental Status: She is alert and oriented to person, place, and time. Mental status is at baseline.  Psychiatric:        Mood and Affect: Mood normal.         Behavior: Behavior normal.     Activities of Daily Living In your present state of health, do you have any difficulty performing the following activities: 11/16/2018  Hearing? N  Vision? Y  Difficulty concentrating or making decisions? Y  Walking or climbing stairs? N  Dressing or bathing? N  Doing errands, shopping? N  Some recent data might be hidden    Fall Risk Assessment Fall Risk  11/16/2018 11/12/2017 05/27/2017 12/16/2015 09/02/2015  Falls in the past year? 0 No No No No     Depression Screen PHQ 2/9 Scores 11/16/2018 11/12/2017 05/27/2017 12/16/2015  PHQ - 2 Score 1 3 0 5  PHQ- 9 Score 4 3 2 10     No flowsheet data found.    Assessment & Plan:    Annual Physical Reviewed patient's Family Medical History Reviewed and updated list of patient's medical providers Assessment of cognitive impairment was done Assessed patient's functional ability Established a written schedule for health screening [place] Completed and Reviewed  Exercise Activities and Dietary recommendations Goals    . DIET - INCREASE WATER INTAKE     Recommend increasing water intake to 4-6 glasses a day.        There is no immunization history for the selected administration types on file for this patient.  Health Maintenance  Topic Date Due  . PAP SMEAR-Modifier  12/15/2018  . INFLUENZA VACCINE  02/07/2019 (Originally 06/09/2018)  . TETANUS/TDAP  11/18/2019 (Originally 12/24/1975)  . MAMMOGRAM  12/01/2019  . COLONOSCOPY  02/10/2026  . Hepatitis C Screening  Completed  . HIV Screening  Completed     Discussed  health benefits of physical activity, and encouraged her to engage in regular exercise appropriate for her age and condition.    1. Annual physical exam  - Lipid Profile  2. Cocaine abuse, continuous use ([place])   3. Cervical cancer screening  Will test for STI as well due to history and recent unprotected sexual encounter.  - Cytology - PAP  4. Essential  (primary) hypertension Continue medications as below, well controlled.  - Comprehensive Metabolic Panel (CMET) - amLODipine (NORVASC) 10 MG tablet; Take 1 tablet (10 mg total) by mouth daily.  Dispense: 90 tablet; Refill: 0 - triamterene-hydrochlorothiazide (MAXZIDE) 75-50 MG tablet; Take 1 tablet by mouth daily.  Dispense: 90 tablet; Refill: 0  5. Hyperlipidemia, unspecified hyperlipidemia type  - Lipid Profile  6. Elevated TSH  - TSH  7. Tobacco use disorder   8. Chronic low back pain with sciatica, sciatica laterality unspecified, unspecified back pain laterality  Provides moderate pain relief.   - chlorzoxazone (PARAFON FORTE DSC) 500 MG tablet; Take 1 tablet (500 mg total) by mouth 2 (two) times daily as needed for muscle spasms.  Dispense: 180 tablet; Refill: 0 - nortriptyline (PAMELOR) 75 MG capsule; Take 1 capsule (75 mg total) by mouth at bedtime.  Dispense: 90 capsule; Refill: 0  9. Other polyneuropathy  - gabapentin (NEURONTIN) 800 MG tablet; TAKE 1 TABLET BY MOUTH THREE TIMES DAILY. FOLLOW UP APPOINTMENT NEEDED TO ASSESS NEUROPATHY STATUS  Dispense: 270 tablet; Refill: 0 - nortriptyline (PAMELOR) 75 MG capsule; Take 1 capsule (75 mg total) by mouth at bedtime.  Dispense: 90 capsule; Refill: 0  The entirety of the information documented in the History of Present Illness, Review of Systems and Physical Exam were personally obtained by me. Portions of this information were initially documented by Lynford Humphrey, CMA and reviewed by me for thoroughness and accuracy.   Return in about 6 months (around 05/17/2019) for HTN, HLD, back pain .  ------------------------------------------------------------------------------------------------------------    Trinna Post, PA-C  [place]

## 2018-11-18 ENCOUNTER — Telehealth: Payer: Self-pay

## 2018-11-18 LAB — CYTOLOGY - PAP
Chlamydia: NEGATIVE
Diagnosis: NEGATIVE
HPV: NOT DETECTED
Neisseria Gonorrhea: NEGATIVE
Trichomonas: NEGATIVE

## 2018-11-18 NOTE — Telephone Encounter (Signed)
[id number] 

## 2018-11-18 NOTE — Telephone Encounter (Signed)
-----   Message from Trinna Post, [place] sent at 11/18/2018 10:48 AM EST ----- PAP smear is normal and negative for STI.

## 2018-11-18 NOTE — Telephone Encounter (Signed)
Patient advised as below.  

## 2018-11-30 ENCOUNTER — Ambulatory Visit: Payer: PPO

## 2018-11-30 ENCOUNTER — Other Ambulatory Visit: Payer: Self-pay | Admitting: Family Medicine

## 2019-01-20 ENCOUNTER — Other Ambulatory Visit: Payer: Self-pay | Admitting: Physician Assistant

## 2019-01-20 DIAGNOSIS — I10 Essential (primary) hypertension: Secondary | ICD-10-CM

## 2019-01-20 DIAGNOSIS — G8929 Other chronic pain: Secondary | ICD-10-CM

## 2019-01-20 DIAGNOSIS — M5440 Lumbago with sciatica, unspecified side: Secondary | ICD-10-CM

## 2019-03-02 ENCOUNTER — Other Ambulatory Visit: Payer: Self-pay | Admitting: Physician Assistant

## 2019-03-02 ENCOUNTER — Other Ambulatory Visit: Payer: Self-pay | Admitting: Family Medicine

## 2019-03-02 DIAGNOSIS — G6289 Other specified polyneuropathies: Secondary | ICD-10-CM

## 2019-03-02 NOTE — Telephone Encounter (Signed)
Please review

## 2019-03-07 ENCOUNTER — Other Ambulatory Visit: Payer: Self-pay | Admitting: Physician Assistant

## 2019-03-07 DIAGNOSIS — K219 Gastro-esophageal reflux disease without esophagitis: Secondary | ICD-10-CM

## 2019-03-22 ENCOUNTER — Encounter: Payer: Self-pay | Admitting: Physician Assistant

## 2019-03-22 ENCOUNTER — Telehealth: Payer: Self-pay | Admitting: Physician Assistant

## 2019-03-22 ENCOUNTER — Ambulatory Visit (INDEPENDENT_AMBULATORY_CARE_PROVIDER_SITE_OTHER): Payer: Medicare Other | Admitting: Physician Assistant

## 2019-03-22 ENCOUNTER — Other Ambulatory Visit: Payer: Self-pay

## 2019-03-22 VITALS — BP 118/84 | HR 80 | Temp 98.3°F | Resp 15 | Wt 214.2 lb

## 2019-03-22 DIAGNOSIS — F1411 Cocaine abuse, in remission: Secondary | ICD-10-CM

## 2019-03-22 DIAGNOSIS — G8929 Other chronic pain: Secondary | ICD-10-CM

## 2019-03-22 DIAGNOSIS — M5440 Lumbago with sciatica, unspecified side: Secondary | ICD-10-CM

## 2019-03-22 DIAGNOSIS — R062 Wheezing: Secondary | ICD-10-CM | POA: Diagnosis not present

## 2019-03-22 DIAGNOSIS — K219 Gastro-esophageal reflux disease without esophagitis: Secondary | ICD-10-CM | POA: Diagnosis not present

## 2019-03-22 DIAGNOSIS — K047 Periapical abscess without sinus: Secondary | ICD-10-CM

## 2019-03-22 DIAGNOSIS — G6289 Other specified polyneuropathies: Secondary | ICD-10-CM

## 2019-03-22 DIAGNOSIS — I10 Essential (primary) hypertension: Secondary | ICD-10-CM

## 2019-03-22 MED ORDER — ALBUTEROL SULFATE HFA 108 (90 BASE) MCG/ACT IN AERS: 2.0000 | INHALATION_SPRAY | Freq: Four times a day (QID) | RESPIRATORY_TRACT | 2 refills | Status: DC | PRN

## 2019-03-22 MED ORDER — AMLODIPINE BESYLATE 10 MG PO TABS: 10.0000 mg | ORAL_TABLET | Freq: Every day | ORAL | 0 refills | Status: DC

## 2019-03-22 MED ORDER — OMEPRAZOLE 20 MG PO CPDR: 20.0000 mg | DELAYED_RELEASE_CAPSULE | Freq: Every day | ORAL | 0 refills | Status: DC

## 2019-03-22 MED ORDER — TRIAMTERENE-HCTZ 75-50 MG PO TABS: 1.0000 | ORAL_TABLET | Freq: Every day | ORAL | 0 refills | Status: DC

## 2019-03-22 MED ORDER — UMECLIDINIUM-VILANTEROL 62.5-25 MCG/INH IN AEPB: 1.0000 | INHALATION_SPRAY | Freq: Every day | RESPIRATORY_TRACT | 0 refills | Status: AC

## 2019-03-22 NOTE — Telephone Encounter (Signed)
Pt called saying she was just in this morning and forgot to tell you about an abscess tooth that she has had about a week or so.  This is one of the main reasons she wanted to come in.  She wanted to ask if you could give her an antibiotic.  [place]  CB#  [phone]  Thanks Con Memos

## 2019-03-22 NOTE — Progress Notes (Signed)
Patient: Elizabeth Hogan Female    DOB: [birth year]   63 y.o.   MRN: [id number] Visit Date: 03/22/2019  Today's Provider: Trinna Post, PA-C   Chief Complaint  Patient presents with  . Gastroesophageal Reflux   Subjective:     HPI  GERD, Follow up:  The patient was last seen for GERD 6 months ago. Changes made since that visit include none, patient encouraged to continue Omeprazole. She has been eating spicier foods and noticed worsening of reflux with this.   She reports excellent compliance with treatment. She is not having side effects. .  She IS experiencing abdominal bloating, bilious reflux, cough, heartburn and symptoms primarily relate to meals, and lying down after meals. She is NOT experiencing belching, chest pain, choking on food, deep pressure at base of neck, difficulty swallowing, hoarseness, laryngitis, nausea, regurgitation of undigested food or shortness of breath  HTN: Reports she is taking amlodipine 10 mg daily and also maxzide 75-60 mg daily.   BP Readings from Last 3 Encounters:  03/22/19 118/84  11/16/18 101/73  08/12/18 110/70    Wt Readings from Last 3 Encounters:  03/22/19 214 lb 3.2 oz (97.2 kg)  11/16/18 203 lb (92.1 kg)  08/12/18 187 lb (84.8 kg)   Peripheral Neuropathy: Reports this is worsening, experiencing progression of pain and numbness in lower extremities. She is currently taking gabapentin 800 mg tablets TID, celebrex, parafon forte and pamelor.   Cocaine Abuse: She reports she hasn't used cocaine for three months.   Tobacco Abuse: Continues to smoke 1/4 pack per day. Reports she has smoked since a very young age, at least 50 years.   Wheezing: Reports she has wheezing at night time that prevents her from sleeping. She has SOB with activities such as going up the stairs.  ------------------------------------------------------------------------   Allergies  Allergen Reactions  . Codeine Itching  . Codeine Sulfate  Itching and Rash     Current Outpatient Medications:  .  atorvastatin (LIPITOR) 10 MG tablet, Take 1 tablet (10 mg total) by mouth at bedtime., Disp: 90 tablet, Rfl: 0 .  celecoxib (CELEBREX) 100 MG capsule, TAKE 1 CAPSULE BY MOUTH ONCE DAILY (NEED  LABS  DONE  THAT  WERE  ORDERED  ON  11/16/18  TO  CONTINUE  THIS  MEDICATION), Disp: 90 capsule, Rfl: 0 .  chlorzoxazone (PARAFON) 500 MG tablet, TAKE 1 TABLET BY MOUTH TWICE DAILY AS NEEDED FOR MUSCLE SPASM, Disp: 180 tablet, Rfl: 0 .  gabapentin (NEURONTIN) 800 MG tablet, TAKE 1 TABLET BY MOUTH THREE TIMES DAILY. FOLLOW UP APPOINTMENT IS NEEDED TO ASSESS NEUROPATHY STATUS., Disp: 270 tablet, Rfl: 0 .  nortriptyline (PAMELOR) 75 MG capsule, Take 1 capsule (75 mg total) by mouth at bedtime., Disp: 90 capsule, Rfl: 0 .  omeprazole (PRILOSEC) 20 MG capsule, Take 1 capsule by mouth once daily, Disp: 14 capsule, Rfl: 0 .  triamterene-hydrochlorothiazide (MAXZIDE) 75-50 MG tablet, Take 1 tablet by mouth once daily, Disp: 90 tablet, Rfl: 0 .  valACYclovir (VALTREX) 500 MG tablet, Take 1 tablet (500 mg total) by mouth daily., Disp: 90 tablet, Rfl: 0 .  amLODipine (NORVASC) 10 MG tablet, Take 1 tablet (10 mg total) by mouth daily., Disp: 90 tablet, Rfl: 0  Review of Systems  Constitutional: Negative.   HENT: Negative.   Eyes: Negative.   Respiratory: Negative.   Gastrointestinal: Positive for abdominal distention.  Genitourinary: Negative.   Neurological: Negative.     Social History  Tobacco Use  . Smoking status: Current Every Day Smoker    Packs/day: 0.25    Years: 45.00    Pack years: 11.25    Types: Cigarettes  . Smokeless tobacco: Never Used  . Tobacco comment: pt declines quitting at this time  Substance Use Topics  . Alcohol use: Yes    Alcohol/week: 14.0 - 21.0 standard drinks    Types: 14 - 21 Cans of beer per week      Objective:   BP 118/84   Pulse 80   Temp 98.3 F (36.8 C) (Oral)   Resp 15   Wt 214 lb 3.2 oz (97.2  kg)   BMI 30.73 kg/m  Vitals:   03/22/19 0828  BP: 118/84  Pulse: 80  Resp: 15  Temp: 98.3 F (36.8 C)  TempSrc: Oral  Weight: 214 lb 3.2 oz (97.2 kg)     Physical Exam Constitutional:      Appearance: Normal appearance.  Cardiovascular:     Rate and Rhythm: Normal rate and regular rhythm.     Heart sounds: Normal heart sounds.  Pulmonary:     Effort: Pulmonary effort is normal.     Breath sounds: Normal breath sounds.  Skin:    General: Skin is warm and dry.  Neurological:     Mental Status: She is alert and oriented to person, place, and time. Mental status is at baseline.  Psychiatric:        Mood and Affect: Mood normal.        Behavior: Behavior normal.         Assessment & Plan    1. Essential (primary) hypertension  Stable, continue medications.   - Comprehensive Metabolic Panel (CMET) - TSH - Lipid Profile - CBC with Differential - amLODipine (NORVASC) 10 MG tablet; Take 1 tablet (10 mg total) by mouth daily.  Dispense: 90 tablet; Refill: 0 - triamterene-hydrochlorothiazide (MAXZIDE) 75-50 MG tablet; Take 1 tablet by mouth daily.  Dispense: 90 tablet; Refill: 0  2. Gastroesophageal reflux disease, esophagitis presence not specified  Reduce spicy foods. - omeprazole (PRILOSEC) 20 MG capsule; Take 1 capsule (20 mg total) by mouth daily.  Dispense: 90 capsule; Refill: 0  3. Wheezing  Likely COPD due to long term tobacco abuse. Will do trial of anoro, first dose in office today. Instructed to rinse mouth out afterwards. Can do spirometry at next visit.   - albuterol (VENTOLIN HFA) 108 (90 Base) MCG/ACT inhaler; Inhale 2 puffs into the lungs every 6 (six) hours as needed for wheezing or shortness of breath.  Dispense: 1 Inhaler; Refill: 2 - umeclidinium-vilanterol (ANORO ELLIPTA) 62.5-25 MCG/INH AEPB; Inhale 1 puff into the lungs daily for 14 days.  Dispense: 14 each; Refill: 0  4. Chronic low back pain with sciatica, sciatica laterality unspecified,  unspecified back pain laterality   5. Other polyneuropathy  Will increase gabapentin to 1200 mg TID or 1.5 tablets.  6. Cocaine abuse in remission [facility])  She reports she has not used in 3 months. Congratulated on this.  The entirety of the information documented in the History of Present Illness, Review of Systems and Physical Exam were personally obtained by me. Portions of this information were initially documented by Jennings Books, CMA and reviewed by me for thoroughness and accuracy.   Return in about 6 months (around 09/22/2019) for HTN.     Trinna Post, PA-C  [place] [facility],Kathleen J Woden as a scribe for [facility]  Terrilee Croak, PA-C.,have documented all relevant documentation on the behalf of Trinna Post, PA-C,as directed by  Trinna Post, PA-C while in the presence of Trinna Post, PA-C.

## 2019-03-22 NOTE — Patient Instructions (Signed)
Take 1.5 tablets or 1200 mg of gabapentin 3 times daily.    Gabapentin capsules or tablets What is this medicine? GABAPENTIN (GA ba pen tin) is used to control seizures in certain types of epilepsy. It is also used to treat certain types of nerve pain. This medicine may be used for other purposes; ask your health care provider or pharmacist if you have questions. COMMON BRAND NAME(S): Active-PAC with Gabapentin, Gabarone, Neurontin What should I tell my health care provider before I take this medicine? They need to know if you have any of these conditions: -kidney disease -suicidal thoughts, plans, or attempt; a previous suicide attempt by you or a family member -an unusual or allergic reaction to gabapentin, other medicines, foods, dyes, or preservatives -pregnant or trying to get pregnant -breast-feeding How should I use this medicine? Take this medicine by mouth with a glass of water. Follow the directions on the prescription label. You can take it with or without food. If it upsets your stomach, take it with food. Take your medicine at regular intervals. Do not take it more often than directed. Do not stop taking except on your doctor's advice. If you are directed to break the 600 or 800 mg tablets in half as part of your dose, the extra half tablet should be used for the next dose. If you have not used the extra half tablet within 28 days, it should be thrown away. A special MedGuide will be given to you by the pharmacist with each prescription and refill. Be sure to read this information carefully each time. Talk to your pediatrician regarding the use of this medicine in children. While this drug may be prescribed for children as young as 3 years for selected conditions, precautions do apply. Overdosage: If you think you have taken too much of this medicine contact a poison control center or emergency room at once. NOTE: This medicine is only for you. Do not share this medicine with  others. What if I miss a dose? If you miss a dose, take it as soon as you can. If it is almost time for your next dose, take only that dose. Do not take double or extra doses. What may interact with this medicine? Do not take this medicine with any of the following medications: -other gabapentin products This medicine may also interact with the following medications: -alcohol -antacids -antihistamines for allergy, cough and cold -certain medicines for anxiety or sleep -certain medicines for depression or psychotic disturbances -homatropine; hydrocodone -naproxen -narcotic medicines (opiates) for pain -phenothiazines like chlorpromazine, mesoridazine, prochlorperazine, thioridazine This list may not describe all possible interactions. Give your health care provider a list of all the medicines, herbs, non-prescription drugs, or dietary supplements you use. Also tell them if you smoke, drink alcohol, or use illegal drugs. Some items may interact with your medicine. What should I watch for while using this medicine? Visit your doctor or health care professional for regular checks on your progress. You may want to keep a record at home of how you feel your condition is responding to treatment. You may want to share this information with your doctor or health care professional at each visit. You should contact your doctor or health care professional if your seizures get worse or if you have any new types of seizures. Do not stop taking this medicine or any of your seizure medicines unless instructed by your doctor or health care professional. Stopping your medicine suddenly can increase your seizures or their severity.  Wear a medical identification bracelet or chain if you are taking this medicine for seizures, and carry a card that lists all your medications. You may get drowsy, dizzy, or have blurred vision. Do not drive, use machinery, or do anything that needs mental alertness until you know how  this medicine affects you. To reduce dizzy or fainting spells, do not sit or stand up quickly, especially if you are an older patient. Alcohol can increase drowsiness and dizziness. Avoid alcoholic drinks. Your mouth may get dry. Chewing sugarless gum or sucking hard candy, and drinking plenty of water will help. The use of this medicine may increase the chance of suicidal thoughts or actions. Pay special attention to how you are responding while on this medicine. Any worsening of mood, or thoughts of suicide or dying should be reported to your health care professional right away. Women who become pregnant while using this medicine may enroll in the [place] Pregnancy Registry by calling [phone]. This registry collects information about the safety of antiepileptic drug use during pregnancy. What side effects may I notice from receiving this medicine? Side effects that you should report to your doctor or health care professional as soon as possible: -allergic reactions like skin rash, itching or hives, swelling of the face, lips, or tongue -worsening of mood, thoughts or actions of suicide or dying Side effects that usually do not require medical attention (report to your doctor or health care professional if they continue or are bothersome): -constipation -difficulty walking or controlling muscle movements -dizziness -nausea -slurred speech -tiredness -tremors -weight gain This list may not describe all possible side effects. Call your doctor for medical advice about side effects. You may report side effects to FDA at 1-800-FDA-1088. Where should I keep my medicine? Keep out of reach of children. This medicine may cause accidental overdose and death if it taken by other adults, children, or pets. Mix any unused medicine with a substance like cat litter or coffee grounds. Then throw the medicine away in a sealed container like a sealed bag or a coffee can with a  lid. Do not use the medicine after the expiration date. Store at room temperature between 15 and 30 degrees C (59 and 86 degrees F). NOTE: This sheet is a summary. It may not cover all possible information. If you have questions about this medicine, talk to your doctor, pharmacist, or health care provider.  2019 Elsevier/Gold Standard (2018-03-31 13:21:44)

## 2019-03-23 LAB — CBC WITH DIFFERENTIAL/PLATELET
Basophils Absolute: 0.1 10*3/uL (ref 0.0–0.2)
Basos: 1 %
EOS (ABSOLUTE): 0.2 10*3/uL (ref 0.0–0.4)
Eos: 4 %
Hematocrit: 38 % (ref 34.0–46.6)
Hemoglobin: 13 g/dL (ref 11.1–15.9)
Immature Grans (Abs): 0 10*3/uL (ref 0.0–0.1)
Immature Granulocytes: 0 %
Lymphocytes Absolute: 2.1 10*3/uL (ref 0.7–3.1)
Lymphs: 42 %
MCH: 30.7 pg (ref 26.6–33.0)
MCHC: 34.2 g/dL (ref 31.5–35.7)
MCV: 90 fL (ref 79–97)
Monocytes Absolute: 0.4 10*3/uL (ref 0.1–0.9)
Monocytes: 7 %
Neutrophils Absolute: 2.2 10*3/uL (ref 1.4–7.0)
Neutrophils: 46 %
Platelets: 346 10*3/uL (ref 150–450)
RBC: 4.24 x10E6/uL (ref 3.77–5.28)
RDW: 13 % (ref 11.7–15.4)
WBC: 4.9 10*3/uL (ref 3.4–10.8)

## 2019-03-23 LAB — COMPREHENSIVE METABOLIC PANEL
ALT: 36 IU/L — ABNORMAL HIGH (ref 0–32)
AST: 31 IU/L (ref 0–40)
Albumin/Globulin Ratio: 1.7 (ref 1.2–2.2)
Albumin: 4.6 g/dL (ref 3.8–4.8)
Alkaline Phosphatase: 78 IU/L (ref 39–117)
BUN/Creatinine Ratio: 27 (ref 12–28)
BUN: 23 mg/dL (ref 8–27)
Bilirubin Total: 0.3 mg/dL (ref 0.0–1.2)
CO2: 24 mmol/L (ref 20–29)
Calcium: 9.7 mg/dL (ref 8.7–10.3)
Chloride: 100 mmol/L (ref 96–106)
Creatinine, Ser: 0.86 mg/dL (ref 0.57–1.00)
GFR calc Af Amer: 84 mL/min/{1.73_m2} (ref >59)
GFR calc non Af Amer: 73 mL/min/{1.73_m2} (ref >59)
Globulin, Total: 2.7 g/dL (ref 1.5–4.5)
Glucose: 101 mg/dL — ABNORMAL HIGH (ref 65–99)
Potassium: 3.9 mmol/L (ref 3.5–5.2)
Sodium: 141 mmol/L (ref 134–144)
Total Protein: 7.3 g/dL (ref 6.0–8.5)

## 2019-03-23 LAB — LIPID PANEL
Chol/HDL Ratio: 5.3 ratio — ABNORMAL HIGH (ref 0.0–4.4)
Cholesterol, Total: 263 mg/dL — ABNORMAL HIGH (ref 100–199)
HDL: 50 mg/dL (ref >39)
LDL Calculated: 190 mg/dL — ABNORMAL HIGH (ref 0–99)
Triglycerides: 117 mg/dL (ref 0–149)
VLDL Cholesterol Cal: 23 mg/dL (ref 5–40)

## 2019-03-23 LAB — TSH: TSH: 9.45 u[IU]/mL — ABNORMAL HIGH (ref 0.450–4.500)

## 2019-03-23 MED ORDER — AMOXICILLIN-POT CLAVULANATE 875-125 MG PO TABS: 1.0000 | ORAL_TABLET | Freq: Two times a day (BID) | ORAL | 0 refills | Status: AC

## 2019-03-23 NOTE — Telephone Encounter (Signed)
Augmentin one pill twice daily x 7 days sent to [facility] on [address].

## 2019-03-23 NOTE — Telephone Encounter (Signed)
LVMTRC 

## 2019-03-24 ENCOUNTER — Telehealth: Payer: Self-pay

## 2019-03-24 NOTE — Telephone Encounter (Signed)
[id number] 

## 2019-03-24 NOTE — Telephone Encounter (Signed)
Test codes were added

## 2019-03-24 NOTE — Telephone Encounter (Signed)
Left message on pt.'s vm.

## 2019-03-24 NOTE — Telephone Encounter (Signed)
-----   Message from Trinna Post, [place] sent at 03/23/2019  3:24 PM EDT ----- Can we please add T3 and T4 under diagnosis of elevated TSH?

## 2019-03-27 NOTE — Telephone Encounter (Signed)
Can we mail letter? After that yes.

## 2019-03-27 NOTE — Telephone Encounter (Signed)
Unable to reach this patient after multiple attempts, okay to close out encounter? KW

## 2019-03-28 LAB — SPECIMEN STATUS REPORT

## 2019-03-28 LAB — T4, FREE: Free T4: 0.9 ng/dL (ref 0.82–1.77)

## 2019-03-28 LAB — T4: T4, Total: 5.9 ug/dL (ref 4.5–12.0)

## 2019-03-29 NOTE — Telephone Encounter (Signed)
Letter mailed to home. KW

## 2019-03-29 NOTE — Telephone Encounter (Signed)
Left MTCB on vm

## 2019-04-18 ENCOUNTER — Other Ambulatory Visit: Payer: Self-pay | Admitting: Physician Assistant

## 2019-04-18 DIAGNOSIS — G8929 Other chronic pain: Secondary | ICD-10-CM

## 2019-05-04 ENCOUNTER — Other Ambulatory Visit: Payer: Self-pay

## 2019-05-04 ENCOUNTER — Encounter: Payer: Self-pay | Admitting: *Deleted

## 2019-05-08 NOTE — Discharge Instructions (Signed)

## 2019-05-09 ENCOUNTER — Other Ambulatory Visit: Payer: Self-pay

## 2019-05-09 ENCOUNTER — Other Ambulatory Visit
Admission: RE | Admit: 2019-05-09 | Discharge: 2019-05-09 | Disposition: A | Discharge status: Home / Self Care | Payer: Medicare Other | Source: Ambulatory Visit | Attending: Ophthalmology | Admitting: Ophthalmology

## 2019-05-09 DIAGNOSIS — Z1159 Encounter for screening for other viral diseases: Secondary | ICD-10-CM | POA: Diagnosis not present

## 2019-05-10 ENCOUNTER — Other Ambulatory Visit: Payer: Self-pay | Admitting: Physician Assistant

## 2019-05-10 DIAGNOSIS — G6289 Other specified polyneuropathies: Secondary | ICD-10-CM

## 2019-05-10 LAB — NOVEL CORONAVIRUS, NAA (HOSP ORDER, SEND-OUT TO REF LAB; TAT 18-24 HRS): SARS-CoV-2, NAA: NOT DETECTED

## 2019-05-12 ENCOUNTER — Ambulatory Visit
Admission: RE | Admit: 2019-05-12 | Discharge: 2019-05-12 | Disposition: A | Discharge status: Home / Self Care | Payer: Medicare Other | Attending: Ophthalmology | Admitting: Ophthalmology

## 2019-05-12 ENCOUNTER — Ambulatory Visit: Payer: Medicare Other | Admitting: Anesthesiology

## 2019-05-12 ENCOUNTER — Encounter
Admission: RE | Disposition: A | Discharge status: Home / Self Care | Payer: Self-pay | Source: Home / Self Care | Attending: Ophthalmology

## 2019-05-12 DIAGNOSIS — K219 Gastro-esophageal reflux disease without esophagitis: Secondary | ICD-10-CM | POA: Diagnosis not present

## 2019-05-12 DIAGNOSIS — H269 Unspecified cataract: Secondary | ICD-10-CM | POA: Diagnosis not present

## 2019-05-12 DIAGNOSIS — G629 Polyneuropathy, unspecified: Secondary | ICD-10-CM | POA: Insufficient documentation

## 2019-05-12 DIAGNOSIS — F17200 Nicotine dependence, unspecified, uncomplicated: Secondary | ICD-10-CM | POA: Diagnosis not present

## 2019-05-12 DIAGNOSIS — Z79899 Other long term (current) drug therapy: Secondary | ICD-10-CM | POA: Insufficient documentation

## 2019-05-12 DIAGNOSIS — I10 Essential (primary) hypertension: Secondary | ICD-10-CM | POA: Insufficient documentation

## 2019-05-12 DIAGNOSIS — F419 Anxiety disorder, unspecified: Secondary | ICD-10-CM | POA: Diagnosis not present

## 2019-05-12 DIAGNOSIS — M1990 Unspecified osteoarthritis, unspecified site: Secondary | ICD-10-CM | POA: Insufficient documentation

## 2019-05-12 HISTORY — PX: CATARACT EXTRACTION W/PHACO: SHX586

## 2019-05-12 HISTORY — DX: Gastro-esophageal reflux disease without esophagitis: K21.9

## 2019-05-12 HISTORY — DX: Presence of dental prosthetic device (complete) (partial): Z97.2

## 2019-05-12 HISTORY — DX: Unspecified convulsions: R56.9

## 2019-05-12 SURGERY — PHACOEMULSIFICATION, CATARACT, WITH IOL INSERTION
Anesthesia: Monitor Anesthesia Care | Site: Eye | Laterality: Left

## 2019-05-12 MED ORDER — CEFUROXIME OPHTHALMIC INJECTION 1 MG/0.1 ML
INJECTION | OPHTHALMIC | Status: DC | PRN
  Administered 2019-05-12: 0.1 mL via INTRACAMERAL

## 2019-05-12 MED ORDER — MIDAZOLAM HCL 2 MG/2ML IJ SOLN
INTRAMUSCULAR | Status: DC | PRN
  Administered 2019-05-12: 2 mg via INTRAVENOUS

## 2019-05-12 MED ORDER — TETRACAINE HCL 0.5 % OP SOLN
1.0000 [drp] | OPHTHALMIC | Status: DC | PRN
  Administered 2019-05-12 (×3): 1 [drp] via OPHTHALMIC

## 2019-05-12 MED ORDER — BRIMONIDINE TARTRATE-TIMOLOL 0.2-0.5 % OP SOLN
OPHTHALMIC | Status: DC | PRN
  Administered 2019-05-12: 1 [drp] via OPHTHALMIC

## 2019-05-12 MED ORDER — LIDOCAINE HCL (PF) 2 % IJ SOLN
INTRAOCULAR | Status: DC | PRN
  Administered 2019-05-12: 2 mL

## 2019-05-12 MED ORDER — MEPERIDINE HCL 25 MG/ML IJ SOLN: 6.2500 mg | INTRAMUSCULAR | Status: DC | PRN

## 2019-05-12 MED ORDER — SODIUM HYALURONATE 23 MG/ML IO SOLN
INTRAOCULAR | Status: DC | PRN
  Administered 2019-05-12: 0.6 mL via INTRAOCULAR

## 2019-05-12 MED ORDER — FENTANYL CITRATE (PF) 100 MCG/2ML IJ SOLN: 25.0000 ug | INTRAMUSCULAR | Status: DC | PRN

## 2019-05-12 MED ORDER — FENTANYL CITRATE (PF) 100 MCG/2ML IJ SOLN
INTRAMUSCULAR | Status: DC | PRN
  Administered 2019-05-12 (×2): 50 ug via INTRAVENOUS

## 2019-05-12 MED ORDER — ARMC OPHTHALMIC DILATING DROPS
1.0000 "application " | OPHTHALMIC | Status: DC | PRN
  Administered 2019-05-12 (×3): 1 via OPHTHALMIC

## 2019-05-12 MED ORDER — LACTATED RINGERS IV SOLN: 10.0000 mL/h | INTRAVENOUS | Status: DC

## 2019-05-12 MED ORDER — NA HYALUR & NA CHOND-NA HYALUR 0.4-0.35 ML IO KIT
PACK | INTRAOCULAR | Status: DC | PRN
  Administered 2019-05-12: 1 mL via INTRAOCULAR

## 2019-05-12 MED ORDER — EPINEPHRINE PF 1 MG/ML IJ SOLN
INTRAOCULAR | Status: DC | PRN
  Administered 2019-05-12: 80 mL via OPHTHALMIC

## 2019-05-12 MED ORDER — OXYCODONE HCL 5 MG/5ML PO SOLN: 5.0000 mg | Freq: Once | ORAL | Status: DC | PRN

## 2019-05-12 MED ORDER — PROMETHAZINE HCL 25 MG/ML IJ SOLN: 6.2500 mg | INTRAMUSCULAR | Status: DC | PRN

## 2019-05-12 MED ORDER — OXYCODONE HCL 5 MG PO TABS: 5.0000 mg | ORAL_TABLET | Freq: Once | ORAL | Status: DC | PRN

## 2019-05-12 MED ORDER — TRYPAN BLUE 0.06 % OP SOLN
OPHTHALMIC | Status: DC | PRN
  Administered 2019-05-12: 0.5 mL via INTRAOCULAR

## 2019-05-12 MED ORDER — MOXIFLOXACIN HCL 0.5 % OP SOLN
1.0000 [drp] | OPHTHALMIC | Status: DC | PRN
  Administered 2019-05-12 (×3): 1 [drp] via OPHTHALMIC

## 2019-05-12 SURGICAL SUPPLY — 20 items
CANNULA ANT/CHMB 27G (MISCELLANEOUS) ×1 IMPLANT
CANNULA ANT/CHMB 27GA (MISCELLANEOUS) ×6 IMPLANT
GLOVE SURG LX 7.5 STRW (GLOVE) ×2
GLOVE SURG LX STRL 7.5 STRW (GLOVE) ×1 IMPLANT
GLOVE SURG TRIUMPH 8.0 PF LTX (GLOVE) ×3 IMPLANT
GOWN STRL REUS W/ TWL LRG LVL3 (GOWN DISPOSABLE) ×2 IMPLANT
GOWN STRL REUS W/TWL LRG LVL3 (GOWN DISPOSABLE) ×4
LENS IOL TECNIS ITEC 18.5 (Intraocular Lens) ×2 IMPLANT
MARKER SKIN DUAL TIP RULER LAB (MISCELLANEOUS) ×3 IMPLANT
NDL FILTER BLUNT 18X1 1/2 (NEEDLE) ×1 IMPLANT
NEEDLE FILTER BLUNT 18X 1/2SAF (NEEDLE) ×2
NEEDLE FILTER BLUNT 18X1 1/2 (NEEDLE) ×1 IMPLANT
PACK CATARACT BRASINGTON (MISCELLANEOUS) ×3 IMPLANT
PACK EYE AFTER SURG (MISCELLANEOUS) ×3 IMPLANT
PACK OPTHALMIC (MISCELLANEOUS) ×3 IMPLANT
SYR 3ML LL SCALE MARK (SYRINGE) ×3 IMPLANT
SYR 5ML LL (SYRINGE) ×3 IMPLANT
SYR TB 1ML LUER SLIP (SYRINGE) ×3 IMPLANT
WATER STERILE IRR 500ML POUR (IV SOLUTION) ×3 IMPLANT
WIPE NON LINTING 3.25X3.25 (MISCELLANEOUS) ×3 IMPLANT

## 2019-05-12 NOTE — Anesthesia Postprocedure Evaluation (Signed)
Anesthesia Post Note  Patient: [place]  Procedure(s) Performed: CATARACT EXTRACTION PHACO AND INTRAOCULAR LENS PLACEMENT (IOC) COMPLICATED  LEFT HEALON 5, VISION BLUE, SYMFONY LENS (Left Eye)  Patient location during evaluation: PACU Anesthesia Type: MAC Level of consciousness: awake and alert Pain management: pain level controlled Vital Signs Assessment: post-procedure vital signs reviewed and stable Respiratory status: spontaneous breathing, nonlabored ventilation, respiratory function stable and patient connected to nasal cannula oxygen Cardiovascular status: stable and blood pressure returned to baseline Postop Assessment: no apparent nausea or vomiting Anesthetic complications: no    SCOURAS, NICOLE ELAINE

## 2019-05-12 NOTE — Op Note (Signed)
OPERATIVE NOTE  Elizabeth Hogan [id number] 05/12/2019   PREOPERATIVE DIAGNOSIS:  H25.89 CATARACT           Mature (Total) Cataract Left Eye H25.89   POSTOPERATIVE DIAGNOSIS: H25.89 CATARACT        Mature (Total) Cataract Left Eye H25.89   PROCEDURE:  Phacoemusification with posterior chamber intraocular lens placement of the right eye .  Vision Blue dye was used to stain the lens capsule.  LENS:   Implant Name Type Inv. Item Serial No. Manufacturer Lot No. LRB No. Used Action  LENS IOL DIOP 18.5 - [id number] Intraocular Lens LENS IOL DIOP 18.5 [id number]  Left 1 Implanted       ULTRASOUND TIME: 19 of 1 minutes 20 seconds, CDE 15.2  SURGEON:  Wyonia Hough, MD   ANESTHESIA:  Topical with tetracaine drops and 2% Xylocaine jelly, augmented with 1% preservative-free intracameral lidocaine.   COMPLICATIONS:  None.   DESCRIPTION OF PROCEDURE:  The patient was identified in the holding room and transported to the operating room and placed in the supine position under the operating microscope. Theleft eye was identified as the operative eye and it was prepped and draped in the usual sterile ophthalmic fashion.  A 1 millimeter clear-corneal paracentesis was made at the 1:30 position.  0.5 ml of preservative-free 1% lidocaine was injected into the anterior chamber. The anterior chamber was filled with Healon 5 viscoelastic.  A 2.4 millimeter keratome was used to make a near-clear corneal incision at the 10:30 position.  The anterior chamber was filled with Healon 5 viscoelastic.  Vision Blue dye was then injected under the viscoelastic to stain the lens capsule.  BSS was then used to wash the dye out.  Additional Healon 5 was placed into the anterior chamber.  A curvilinear capsulorrhexis was made with a cystotome and capsulorrhexis forceps.  Balanced salt solution was used to hydrodissect and hydrodelineate the nucleus.  Viscoat was then placed in the anterior chamber.    Phacoemulsification was then used in stop and chop fashion to remove the lens nucleus and epinucleus.  The remaining cortex was then removed using the irrigation and aspiration handpiece. Provisc was then placed into the capsular bag to distend it for lens placement.  A 18.5 -diopter lens was then injected into the capsular bag.  The remaining viscoelastic was aspirated.   Wounds were hydrated with balanced salt solution.  The anterior chamber was inflated to a physiologic pressure with balanced salt solution. Cefuroxime 0.1 ml of a 10mg /ml solution was injected into the anterior chamber for a dose of 1 mg of intracameral antibiotic at the completion of the case. Miostat was placed into the anterior chamber to constrict the pupil.  No wound leaks were noted.  Topical Vigamox drops and Maxitrol ointment were applied to the eye.  The patient was taken to the recovery room in stable condition without complications of anesthesia or surgery.  [name] 05/12/2019, 11:04 AM

## 2019-05-12 NOTE — Anesthesia Preprocedure Evaluation (Signed)
Anesthesia Evaluation  Patient identified by MRN, date of birth, ID band Patient awake    Reviewed: Allergy & Precautions, H&P , NPO status , Patient's Chart, lab work & pertinent test results, reviewed documented beta blocker date and time   Airway Mallampati: II  TM Distance: >3 FB Neck ROM: full    Dental no notable dental hx.    Pulmonary Current Smoker,    Pulmonary exam normal breath sounds clear to auscultation       Cardiovascular Exercise Tolerance: Good hypertension,  Rhythm:regular Rate:Normal     Neuro/Psych Seizures -,  Anxiety negative psych ROS   GI/Hepatic Neg liver ROS, GERD  ,  Endo/Other  negative endocrine ROS  Renal/GU negative Renal ROS  negative genitourinary   Musculoskeletal   Abdominal   Peds  Hematology negative hematology ROS (+)   Anesthesia Other Findings   Reproductive/Obstetrics negative OB ROS                             Anesthesia Physical Anesthesia Plan  ASA: II  Anesthesia Plan: MAC   Post-op Pain Management:    Induction:   PONV Risk Score and Plan:   Airway Management Planned:   Additional Equipment:   Intra-op Plan:   Post-operative Plan:   Informed Consent: I have reviewed the patients History and Physical, chart, labs and discussed the procedure including the risks, benefits and alternatives for the proposed anesthesia with the patient or authorized representative who has indicated his/her understanding and acceptance.     Dental Advisory Given  Plan Discussed with: CRNA  Anesthesia Plan Comments:         Anesthesia Quick Evaluation

## 2019-05-12 NOTE — Transfer of Care (Signed)
Immediate Anesthesia Transfer of Care Note  Patient: Elizabeth Hogan  Procedure(s) Performed: CATARACT EXTRACTION PHACO AND INTRAOCULAR LENS PLACEMENT (IOC) COMPLICATED  LEFT HEALON 5, VISION BLUE, SYMFONY LENS (Left Eye)  Patient Location: PACU  Anesthesia Type: MAC  Level of Consciousness: awake, alert  and patient cooperative  Airway and Oxygen Therapy: Patient Spontanous Breathing and Patient connected to supplemental oxygen  Post-op Assessment: Post-op Vital signs reviewed, Patient's Cardiovascular Status Stable, Respiratory Function Stable, Patent Airway and No signs of Nausea or vomiting  Post-op Vital Signs: Reviewed and stable  Complications: No apparent anesthesia complications

## 2019-05-12 NOTE — Anesthesia Procedure Notes (Signed)
Procedure Name: MAC Performed by: Calleen Alvis, CRNA Pre-anesthesia Checklist: Patient identified, Emergency Drugs available, Suction available, Timeout performed and Patient being monitored Patient Re-evaluated:Patient Re-evaluated prior to induction Oxygen Delivery Method: Nasal cannula Placement Confirmation: positive ETCO2       

## 2019-05-12 NOTE — H&P (Signed)

## 2019-05-15 ENCOUNTER — Encounter: Payer: Self-pay | Admitting: Ophthalmology

## 2019-05-17 ENCOUNTER — Ambulatory Visit: Payer: Self-pay | Admitting: Physician Assistant

## 2019-05-29 ENCOUNTER — Other Ambulatory Visit: Payer: Self-pay | Admitting: Physician Assistant

## 2019-05-29 DIAGNOSIS — R062 Wheezing: Secondary | ICD-10-CM

## 2019-05-30 ENCOUNTER — Telehealth: Payer: Self-pay | Admitting: Physician Assistant

## 2019-05-30 ENCOUNTER — Ambulatory Visit (INDEPENDENT_AMBULATORY_CARE_PROVIDER_SITE_OTHER): Payer: Medicare Other | Admitting: Physician Assistant

## 2019-05-30 DIAGNOSIS — G8929 Other chronic pain: Secondary | ICD-10-CM

## 2019-05-30 DIAGNOSIS — R609 Edema, unspecified: Secondary | ICD-10-CM | POA: Diagnosis not present

## 2019-05-30 DIAGNOSIS — I10 Essential (primary) hypertension: Secondary | ICD-10-CM

## 2019-05-30 DIAGNOSIS — M5440 Lumbago with sciatica, unspecified side: Secondary | ICD-10-CM

## 2019-05-30 NOTE — Telephone Encounter (Signed)
Pt called saying she is having some swelling in her feet and ankles  CB#  [phone]  [place]

## 2019-05-30 NOTE — Patient Instructions (Signed)
Peripheral Edema  Peripheral edema is swelling that is caused by a buildup of fluid. Peripheral edema most often affects the lower legs, ankles, and feet. It can also develop in the arms, hands, and face. The area of the body that has peripheral edema will look swollen. It may also feel heavy or warm. Your clothes may start to feel tight. Pressing on the area may make a temporary dent in your skin. You may not be able to move your swollen arm or leg as much as usual. There are many causes of peripheral edema. It can happen because of a complication of other conditions such as congestive heart failure, kidney disease, or a problem with your blood circulation. It also can be a side effect of certain medicines or because of an infection. It often happens to women during pregnancy. Sometimes, the cause is not known. Follow these instructions at home: Managing pain, stiffness, and swelling   Raise (elevate) your legs while you are sitting or lying down.  Move around often to prevent stiffness and to lessen swelling.  Do not sit or stand for long periods of time.  Wear support stockings as told by your health care provider. Medicines  Take over-the-counter and prescription medicines only as told by your health care provider.  Your health care provider may prescribe medicine to help your body get rid of excess water (diuretic). General instructions  Pay attention to any changes in your symptoms.  Follow instructions from your health care provider about limiting salt (sodium) in your diet. Sometimes, eating less salt may reduce swelling.  Moisturize skin daily to help prevent skin from cracking and draining.  Keep all follow-up visits as told by your health care provider. This is important. Contact a health care provider if you have:  A fever.  Edema that starts suddenly or is getting worse, especially if you are pregnant or have a medical condition.  Swelling in only one leg.  Increased  swelling, redness, or pain in one or both of your legs.  Drainage or sores at the area where you have edema. Get help right away if you:  Develop shortness of breath, especially when you are lying down.  Have pain in your chest or abdomen.  Feel weak.  Feel faint. Summary  Peripheral edema is swelling that is caused by a buildup of fluid. Peripheral edema most often affects the lower legs, ankles, and feet.  Move around often to prevent stiffness and to lessen swelling. Do not sit or stand for long periods of time.  Pay attention to any changes in your symptoms.  Contact a health care provider if you have edema that starts suddenly or is getting worse, especially if you are pregnant or have a medical condition.  Get help right away if you develop shortness of breath, especially when lying down. This information is not intended to replace advice given to you by your health care provider. Make sure you discuss any questions you have with your health care provider. Document Released: 12/03/2004 Document Revised: 07/20/2018 Document Reviewed: 07/20/2018 Elsevier Patient Education  2020 [employer].

## 2019-05-30 NOTE — Telephone Encounter (Signed)
Patient was advised and appointment schedule for 05/30/2019 @ 11:00PM

## 2019-05-30 NOTE — Telephone Encounter (Signed)
Can do virtual or telephone visit.

## 2019-05-30 NOTE — Progress Notes (Signed)
Patient: Elizabeth Hogan Female    DOB: [birth year]   63 y.o.   MRN: [id number] Visit Date: 05/30/2019  Today's Provider: Trinna Post, PA-C   No chief complaint on file.  Subjective:     HPI   Virtual Visit via Telephone Note  I connected with Elizabeth Hogan on 05/30/19 at 11:00 AM EDT by telephone and verified that I am speaking with the correct person using two identifiers.  Location: Patient: Home Provider: Home   I discussed the limitations, risks, security and privacy concerns of performing an evaluation and management service by telephone and the availability of in person appointments. I also discussed with the patient that there may be a patient responsible charge related to this service. The patient expressed understanding and agreed to proceed.  Patient with history of hypertension and back pain. She is on amlodipine 10 mg for the past year without issue. Last visit on 03/22/2019 her gabapentin was increased from 800 mg TID to 1200 mg TID. She reports today that she has some mild lower extremity swelling that resolves when she is off her feet. She denies chest pain, SOB, fatigue. If she pushes her finger on her feet she does not notice a dent. She denies foot pain or her feet being discolored or cool to the touch.   Allergies  Allergen Reactions  . Augmentin [Amoxicillin-Pot Clavulanate] Itching  . Codeine Itching  . Codeine Sulfate Itching and Rash     Current Outpatient Medications:  .  albuterol (VENTOLIN HFA) 108 (90 Base) MCG/ACT inhaler, INHALE 2 PUFFS INTO LUNGS EVERY 6 HOURS AS NEEDED FOR WHEEZING OR SHORTNESS OF BREATH, Disp: 9 g, Rfl: 1 .  amLODipine (NORVASC) 10 MG tablet, Take 1 tablet (10 mg total) by mouth daily., Disp: 90 tablet, Rfl: 0 .  atorvastatin (LIPITOR) 10 MG tablet, Take 1 tablet (10 mg total) by mouth at bedtime. (Patient not taking: Reported on 05/04/2019), Disp: 90 tablet, Rfl: 0 .  celecoxib (CELEBREX) 100 MG capsule, TAKE 1  CAPSULE BY MOUTH ONCE DAILY (NEED  LABS  DONE  THAT  WERE  ORDERED  ON  11/16/18  TO  CONTINUE  THIS  MEDICATION), Disp: 90 capsule, Rfl: 0 .  chlorzoxazone (PARAFON) 500 MG tablet, TAKE 1 TABLET BY MOUTH TWICE DAILY AS NEEDED FOR MUSCLE SPASM, Disp: 180 tablet, Rfl: 0 .  diphenhydrAMINE (BENADRYL ALLERGY) 25 mg capsule, Take 25 mg by mouth every 6 (six) hours as needed., Disp: , Rfl:  .  gabapentin (NEURONTIN) 800 MG tablet, Take 1 tablet (800 mg total) by mouth 3 (three) times daily., Disp: 270 tablet, Rfl: 0 .  nortriptyline (PAMELOR) 75 MG capsule, Take 1 capsule (75 mg total) by mouth at bedtime. (Patient taking differently: Take 25 mg by mouth at bedtime. ), Disp: 90 capsule, Rfl: 0 .  omeprazole (PRILOSEC) 20 MG capsule, Take 1 capsule (20 mg total) by mouth daily., Disp: 90 capsule, Rfl: 0 .  triamterene-hydrochlorothiazide (MAXZIDE) 75-50 MG tablet, Take 1 tablet by mouth daily., Disp: 90 tablet, Rfl: 0 .  valACYclovir (VALTREX) 500 MG tablet, Take 1 tablet (500 mg total) by mouth daily. (Patient not taking: Reported on 05/04/2019), Disp: 90 tablet, Rfl: 0  Review of Systems  Social History   Tobacco Use  . Smoking status: Current Every Day Smoker    Packs/day: 0.25    Years: 45.00    Pack years: 11.25    Types: Cigarettes  . Smokeless tobacco: Never  Used  . Tobacco comment: pt declines quitting at this time  Substance Use Topics  . Alcohol use: Yes    Alcohol/week: 14.0 - 21.0 standard drinks    Types: 14 - 21 Cans of beer per week      Objective:   There were no vitals taken for this visit. There were no vitals filed for this visit.   Physical Exam   No results found for any visits on 05/30/19.     Assessment & Plan    1. Peripheral edema  DDx: gabapentin increase, amlodipine, veinous insufficiency. Suspect this is due to increasing her gabapentin rather than the amlodipine which she has been on for a year without issue as well as veinous insufficiency, which would  be quite sudden. Instructed patient she can try to go back down to the 800 mg TID dosing of gabapentin and see if that brings about improvement. Return precautions advised.  2. Essential (primary) hypertension  Continue current medications, may try reducing amlodipine if not resolved with gabapentin reduction.  3. Chronic low back pain with sciatica, sciatica laterality unspecified, unspecified back pain laterality  Trial of reduced gabapentin.  The entirety of the information documented in the History of Present Illness, Review of Systems and Physical Exam were personally obtained by me. Portions of this information were initially documented by Doran Clay, LPN and reviewed by me for thoroughness and accuracy.   F/u PRN        Trinna Post, PA-C  [facility]

## 2019-06-01 ENCOUNTER — Other Ambulatory Visit: Payer: Medicare Other

## 2019-06-01 NOTE — Discharge Instructions (Signed)

## 2019-06-02 ENCOUNTER — Other Ambulatory Visit: Payer: Self-pay

## 2019-06-02 ENCOUNTER — Other Ambulatory Visit
Admission: RE | Admit: 2019-06-02 | Discharge: 2019-06-02 | Disposition: A | Discharge status: Home / Self Care | Payer: Medicare Other | Source: Ambulatory Visit | Attending: Ophthalmology | Admitting: Ophthalmology

## 2019-06-02 DIAGNOSIS — Z1159 Encounter for screening for other viral diseases: Secondary | ICD-10-CM | POA: Diagnosis present

## 2019-06-03 LAB — SARS CORONAVIRUS 2 (TAT 6-24 HRS): SARS Coronavirus 2: NEGATIVE

## 2019-06-05 NOTE — Anesthesia Preprocedure Evaluation (Addendum)
Anesthesia Evaluation  Patient identified by MRN, date of birth, ID band Patient awake    Reviewed: Allergy & Precautions, NPO status , Patient's Chart, lab work & pertinent test results  History of Anesthesia Complications Negative for: history of anesthetic complications  Airway Mallampati: IV   Neck ROM: Full    Dental  (+) Partial Upper,    Pulmonary Current Smoker (1/3 ppd),    Pulmonary exam normal breath sounds clear to auscultation       Cardiovascular hypertension, Normal cardiovascular exam Rhythm:Regular Rate:Normal     Neuro/Psych Seizures - (none in years, not on medication),  PSYCHIATRIC DISORDERS Anxiety Chronic lower back pain    GI/Hepatic GERD  ,  Endo/Other  negative endocrine ROS  Renal/GU negative Renal ROS     Musculoskeletal   Abdominal   Peds  Hematology negative hematology ROS (+)   Anesthesia Other Findings   Reproductive/Obstetrics                            Anesthesia Physical Anesthesia Plan  ASA: III  Anesthesia Plan: MAC   Post-op Pain Management:    Induction: Intravenous  PONV Risk Score and Plan: 1 and TIVA and Midazolam  Airway Management Planned: Natural Airway  Additional Equipment:   Intra-op Plan:   Post-operative Plan:   Informed Consent: I have reviewed the patients History and Physical, chart, labs and discussed the procedure including the risks, benefits and alternatives for the proposed anesthesia with the patient or authorized representative who has indicated his/her understanding and acceptance.       Plan Discussed with: CRNA  Anesthesia Plan Comments:        Anesthesia Quick Evaluation

## 2019-06-07 ENCOUNTER — Encounter
Admission: RE | Disposition: A | Discharge status: Home / Self Care | Payer: Self-pay | Source: Home / Self Care | Attending: Ophthalmology

## 2019-06-07 ENCOUNTER — Ambulatory Visit: Payer: Medicare Other | Admitting: Anesthesiology

## 2019-06-07 ENCOUNTER — Other Ambulatory Visit: Payer: Self-pay

## 2019-06-07 ENCOUNTER — Ambulatory Visit
Admission: RE | Admit: 2019-06-07 | Discharge: 2019-06-07 | Disposition: A | Discharge status: Home / Self Care | Payer: Medicare Other | Attending: Ophthalmology | Admitting: Ophthalmology

## 2019-06-07 DIAGNOSIS — G8929 Other chronic pain: Secondary | ICD-10-CM | POA: Insufficient documentation

## 2019-06-07 DIAGNOSIS — Z9849 Cataract extraction status, unspecified eye: Secondary | ICD-10-CM | POA: Insufficient documentation

## 2019-06-07 DIAGNOSIS — F419 Anxiety disorder, unspecified: Secondary | ICD-10-CM | POA: Insufficient documentation

## 2019-06-07 DIAGNOSIS — M545 Low back pain: Secondary | ICD-10-CM | POA: Insufficient documentation

## 2019-06-07 DIAGNOSIS — Z90710 Acquired absence of both cervix and uterus: Secondary | ICD-10-CM | POA: Diagnosis not present

## 2019-06-07 DIAGNOSIS — G629 Polyneuropathy, unspecified: Secondary | ICD-10-CM | POA: Insufficient documentation

## 2019-06-07 DIAGNOSIS — K219 Gastro-esophageal reflux disease without esophagitis: Secondary | ICD-10-CM | POA: Insufficient documentation

## 2019-06-07 DIAGNOSIS — I10 Essential (primary) hypertension: Secondary | ICD-10-CM | POA: Insufficient documentation

## 2019-06-07 DIAGNOSIS — F17200 Nicotine dependence, unspecified, uncomplicated: Secondary | ICD-10-CM | POA: Insufficient documentation

## 2019-06-07 DIAGNOSIS — Z880 Allergy status to penicillin: Secondary | ICD-10-CM | POA: Diagnosis not present

## 2019-06-07 DIAGNOSIS — Z885 Allergy status to narcotic agent status: Secondary | ICD-10-CM | POA: Diagnosis not present

## 2019-06-07 DIAGNOSIS — H2511 Age-related nuclear cataract, right eye: Secondary | ICD-10-CM | POA: Insufficient documentation

## 2019-06-07 HISTORY — PX: CATARACT EXTRACTION W/PHACO: SHX586

## 2019-06-07 SURGERY — PHACOEMULSIFICATION, CATARACT, WITH IOL INSERTION
Anesthesia: Monitor Anesthesia Care | Site: Eye | Laterality: Right

## 2019-06-07 MED ORDER — ARMC OPHTHALMIC DILATING DROPS
1.0000 "application " | OPHTHALMIC | Status: DC | PRN
  Administered 2019-06-07 (×3): 1 via OPHTHALMIC

## 2019-06-07 MED ORDER — EPINEPHRINE PF 1 MG/ML IJ SOLN
INTRAOCULAR | Status: DC | PRN
  Administered 2019-06-07: 77 mL via OPHTHALMIC

## 2019-06-07 MED ORDER — MOXIFLOXACIN HCL 0.5 % OP SOLN
1.0000 [drp] | OPHTHALMIC | Status: DC | PRN
  Administered 2019-06-07 (×3): 1 [drp] via OPHTHALMIC

## 2019-06-07 MED ORDER — CEFUROXIME OPHTHALMIC INJECTION 1 MG/0.1 ML
INJECTION | OPHTHALMIC | Status: DC | PRN
  Administered 2019-06-07: 0.1 mL via INTRACAMERAL

## 2019-06-07 MED ORDER — FENTANYL CITRATE (PF) 100 MCG/2ML IJ SOLN
INTRAMUSCULAR | Status: DC | PRN
  Administered 2019-06-07: 100 ug via INTRAVENOUS

## 2019-06-07 MED ORDER — ACETAMINOPHEN 325 MG PO TABS: 650.0000 mg | ORAL_TABLET | Freq: Once | ORAL | Status: DC | PRN

## 2019-06-07 MED ORDER — LIDOCAINE HCL (PF) 2 % IJ SOLN
INTRAOCULAR | Status: DC | PRN
  Administered 2019-06-07: 2 mL

## 2019-06-07 MED ORDER — TETRACAINE HCL 0.5 % OP SOLN
1.0000 [drp] | OPHTHALMIC | Status: DC | PRN
  Administered 2019-06-07 (×3): 1 [drp] via OPHTHALMIC

## 2019-06-07 MED ORDER — BRIMONIDINE TARTRATE-TIMOLOL 0.2-0.5 % OP SOLN
OPHTHALMIC | Status: DC | PRN
  Administered 2019-06-07: 1 [drp] via OPHTHALMIC

## 2019-06-07 MED ORDER — NA HYALUR & NA CHOND-NA HYALUR 0.4-0.35 ML IO KIT
PACK | INTRAOCULAR | Status: DC | PRN
  Administered 2019-06-07: 1 mL via INTRAOCULAR

## 2019-06-07 MED ORDER — ACETAMINOPHEN 160 MG/5ML PO SOLN: 325.0000 mg | ORAL | Status: DC | PRN

## 2019-06-07 MED ORDER — MIDAZOLAM HCL 2 MG/2ML IJ SOLN
INTRAMUSCULAR | Status: DC | PRN
  Administered 2019-06-07: 2 mg via INTRAVENOUS

## 2019-06-07 MED ORDER — ONDANSETRON HCL 4 MG/2ML IJ SOLN: 4.0000 mg | Freq: Once | INTRAMUSCULAR | Status: DC | PRN

## 2019-06-07 SURGICAL SUPPLY — 20 items
CANNULA ANT/CHMB 27G (MISCELLANEOUS) ×1 IMPLANT
CANNULA ANT/CHMB 27GA (MISCELLANEOUS) ×3 IMPLANT
GLOVE SURG LX 7.5 STRW (GLOVE) ×2
GLOVE SURG LX STRL 7.5 STRW (GLOVE) ×1 IMPLANT
GLOVE SURG TRIUMPH 8.0 PF LTX (GLOVE) ×3 IMPLANT
GOWN STRL REUS W/ TWL LRG LVL3 (GOWN DISPOSABLE) ×2 IMPLANT
GOWN STRL REUS W/TWL LRG LVL3 (GOWN DISPOSABLE) ×4
LENS IOL TECNIS ITEC 19.5 (Intraocular Lens) ×2 IMPLANT
MARKER SKIN DUAL TIP RULER LAB (MISCELLANEOUS) ×3 IMPLANT
NDL FILTER BLUNT 18X1 1/2 (NEEDLE) ×1 IMPLANT
NEEDLE FILTER BLUNT 18X 1/2SAF (NEEDLE) ×2
NEEDLE FILTER BLUNT 18X1 1/2 (NEEDLE) ×1 IMPLANT
PACK CATARACT BRASINGTON (MISCELLANEOUS) ×3 IMPLANT
PACK EYE AFTER SURG (MISCELLANEOUS) ×3 IMPLANT
PACK OPTHALMIC (MISCELLANEOUS) ×3 IMPLANT
SYR 3ML LL SCALE MARK (SYRINGE) ×3 IMPLANT
SYR 5ML LL (SYRINGE) ×3 IMPLANT
SYR TB 1ML LUER SLIP (SYRINGE) ×3 IMPLANT
WATER STERILE IRR 500ML POUR (IV SOLUTION) ×3 IMPLANT
WIPE NON LINTING 3.25X3.25 (MISCELLANEOUS) ×3 IMPLANT

## 2019-06-07 NOTE — H&P (Signed)

## 2019-06-07 NOTE — Op Note (Signed)
LOCATION:  [place]   PREOPERATIVE DIAGNOSIS:    Nuclear sclerotic cataract right eye. H25.11   POSTOPERATIVE DIAGNOSIS:  Nuclear sclerotic cataract right eye.     PROCEDURE:  Phacoemusification with posterior chamber intraocular lens placement of the right eye   LENS:   Implant Name Type Inv. Item Serial No. Manufacturer Lot No. LRB No. Used Action  LENS IOL DIOP 19.5 - [id number] Intraocular Lens LENS IOL DIOP 19.5 [id number] AMO  Right 1 Implanted        ULTRASOUND TIME: 11 % of 1 minutes, 24 seconds.  CDE 9.6   SURGEON:  Wyonia Hough, MD   ANESTHESIA:  Topical with tetracaine drops and 2% Xylocaine jelly, augmented with 1% preservative-free intracameral lidocaine.    COMPLICATIONS:  None.   DESCRIPTION OF PROCEDURE:  The patient was identified in the holding room and transported to the operating room and placed in the supine position under the operating microscope.  The right eye was identified as the operative eye and it was prepped and draped in the usual sterile ophthalmic fashion.   A 1 millimeter clear-corneal paracentesis was made at the 12:00 position.  0.5 ml of preservative-free 1% lidocaine was injected into the anterior chamber. The anterior chamber was filled with Viscoat viscoelastic.  A 2.4 millimeter keratome was used to make a near-clear corneal incision at the 9:00 position.  A curvilinear capsulorrhexis was made with a cystotome and capsulorrhexis forceps.  Balanced salt solution was used to hydrodissect and hydrodelineate the nucleus.   Phacoemulsification was then used in stop and chop fashion to remove the lens nucleus and epinucleus.  The remaining cortex was then removed using the irrigation and aspiration handpiece. Provisc was then placed into the capsular bag to distend it for lens placement.  A lens was then injected into the capsular bag.  The remaining viscoelastic was aspirated.   Wounds were hydrated with balanced salt solution.   The anterior chamber was inflated to a physiologic pressure with balanced salt solution.  No wound leaks were noted. Cefuroxime 0.1 ml of a 10mg /ml solution was injected into the anterior chamber for a dose of 1 mg of intracameral antibiotic at the completion of the case.   Timolol and Brimonidine drops were applied to the eye.  The patient was taken to the recovery room in stable condition without complications of anesthesia or surgery.   Malajah Oceguera 06/07/2019, 12:52 PM

## 2019-06-07 NOTE — Anesthesia Postprocedure Evaluation (Signed)
Anesthesia Post Note  Patient: Elizabeth Hogan  Procedure(s) Performed: CATARACT EXTRACTION PHACO AND INTRAOCULAR LENS PLACEMENT (IOC)  COMPLICATED RIGHT (Right Eye)  Patient location during evaluation: PACU Anesthesia Type: MAC Level of consciousness: awake and alert, oriented and patient cooperative Pain management: pain level controlled Vital Signs Assessment: post-procedure vital signs reviewed and stable Respiratory status: spontaneous breathing, nonlabored ventilation and respiratory function stable Cardiovascular status: blood pressure returned to baseline and stable Postop Assessment: adequate PO intake Anesthetic complications: no    Darrin Nipper

## 2019-06-07 NOTE — Anesthesia Procedure Notes (Signed)
Procedure Name: MAC Date/Time: 06/07/2019 12:29 PM Performed by: Cameron Ali, CRNA Pre-anesthesia Checklist: Patient identified, Emergency Drugs available, Suction available, Timeout performed and Patient being monitored Patient Re-evaluated:Patient Re-evaluated prior to induction Oxygen Delivery Method: Nasal cannula Placement Confirmation: positive ETCO2

## 2019-06-07 NOTE — Transfer of Care (Signed)
Immediate Anesthesia Transfer of Care Note  Patient: Elizabeth Hogan  Procedure(s) Performed: CATARACT EXTRACTION PHACO AND INTRAOCULAR LENS PLACEMENT (IOC)  COMPLICATED RIGHT (Right Eye)  Patient Location: PACU  Anesthesia Type: MAC  Level of Consciousness: awake, alert  and patient cooperative  Airway and Oxygen Therapy: Patient Spontanous Breathing and Patient connected to supplemental oxygen  Post-op Assessment: Post-op Vital signs reviewed, Patient's Cardiovascular Status Stable, Respiratory Function Stable, Patent Airway and No signs of Nausea or vomiting  Post-op Vital Signs: Reviewed and stable  Complications: No apparent anesthesia complications

## 2019-06-08 ENCOUNTER — Encounter: Payer: Self-pay | Admitting: Ophthalmology

## 2019-06-09 ENCOUNTER — Other Ambulatory Visit: Payer: Self-pay | Admitting: Physician Assistant

## 2019-06-09 ENCOUNTER — Other Ambulatory Visit: Payer: Self-pay | Admitting: Family Medicine

## 2019-06-09 DIAGNOSIS — G8929 Other chronic pain: Secondary | ICD-10-CM

## 2019-06-09 DIAGNOSIS — M5440 Lumbago with sciatica, unspecified side: Secondary | ICD-10-CM

## 2019-06-30 ENCOUNTER — Encounter: Payer: Self-pay | Admitting: Physician Assistant

## 2019-06-30 ENCOUNTER — Ambulatory Visit (INDEPENDENT_AMBULATORY_CARE_PROVIDER_SITE_OTHER): Payer: Medicare Other | Admitting: Physician Assistant

## 2019-06-30 ENCOUNTER — Other Ambulatory Visit: Payer: Self-pay

## 2019-06-30 VITALS — BP 122/83 | HR 86 | Temp 97.9°F | Resp 16 | Wt 216.0 lb

## 2019-06-30 DIAGNOSIS — E785 Hyperlipidemia, unspecified: Secondary | ICD-10-CM | POA: Diagnosis not present

## 2019-06-30 DIAGNOSIS — R600 Localized edema: Secondary | ICD-10-CM

## 2019-06-30 DIAGNOSIS — M5416 Radiculopathy, lumbar region: Secondary | ICD-10-CM

## 2019-06-30 DIAGNOSIS — I10 Essential (primary) hypertension: Secondary | ICD-10-CM | POA: Diagnosis not present

## 2019-06-30 MED ORDER — AMLODIPINE BESYLATE 5 MG PO TABS: 5.0000 mg | ORAL_TABLET | Freq: Every day | ORAL | 0 refills | Status: DC

## 2019-06-30 MED ORDER — ROSUVASTATIN CALCIUM 10 MG PO TABS: 10.0000 mg | ORAL_TABLET | Freq: Every day | ORAL | 3 refills | Status: DC

## 2019-06-30 NOTE — Progress Notes (Signed)
Patient: Elizabeth Hogan Female    DOB: 21-Jul-1956   63 y.o.   MRN: [id number] Visit Date: 06/30/2019  Today's Provider: Trinna Post, PA-C   Chief Complaint  Patient presents with  . Leg Swelling   Subjective:    I,Elizabeth Hogan,RMA am acting as a Education administrator for [employer], PA-C.  HPI  Patient with c/o swelling on ankles and feet. She noticed the swelling has been going on for the past 4-6 weeks. Was seen for this four weeks ago and she was recommended to decrease gabapentin and she did but did not notice improvement in her symptoms. She continues with amlodipine 10 mg daily.   Patient reports that she is also having pain from her left buttocks down to her knee. No injury. She is taking gabapentin, pamelor, parafon forte and celebrex. She had previously been followed by neurologist in [place] but would like to see a specialist closer to home. She has had an epidural steroid injection once prior with minimal relief.   HLD: She was started on Lipitor 10 mg QHS which she reports gave her bruises on her legs. She stopped the medication and her bruises improved. She restarted the medication and the bruises returned.   Lipid Panel     Component Value Date/Time   CHOL 263 (H) 03/22/2019 0901   TRIG 117 03/22/2019 0901   HDL 50 03/22/2019 0901   CHOLHDL 5.3 (H) 03/22/2019 0901   LDLCALC 190 (H) 03/22/2019 0901   History of cocaine abuse: last used in March.   Allergies  Allergen Reactions  . Augmentin [Amoxicillin-Pot Clavulanate] Itching  . Codeine Itching  . Codeine Sulfate Itching and Rash     Current Outpatient Medications:  .  albuterol (VENTOLIN HFA) 108 (90 Base) MCG/ACT inhaler, INHALE 2 PUFFS INTO LUNGS EVERY 6 HOURS AS NEEDED FOR WHEEZING OR SHORTNESS OF BREATH, Disp: 9 g, Rfl: 1 .  celecoxib (CELEBREX) 100 MG capsule, TAKE 1 CAPSULE BY MOUTH ONCE DAILY. NEEDS LABS DONE THAT WERE ORDERED ON 11/16/18 TO CONTINUE THIS MEDICATION., Disp: 30 capsule, Rfl: 0  .  chlorzoxazone (PARAFON) 500 MG tablet, TAKE 1 TABLET BY MOUTH TWICE DAILY AS NEEDED FOR MUSCLE SPASM, Disp: 180 tablet, Rfl: 0 .  diphenhydrAMINE (BENADRYL ALLERGY) 25 mg capsule, Take 25 mg by mouth every 6 (six) hours as needed., Disp: , Rfl:  .  gabapentin (NEURONTIN) 800 MG tablet, Take 1 tablet (800 mg total) by mouth 3 (three) times daily., Disp: 270 tablet, Rfl: 0 .  nortriptyline (PAMELOR) 75 MG capsule, Take 1 capsule (75 mg total) by mouth at bedtime. (Patient taking differently: Take 25 mg by mouth at bedtime. ), Disp: 90 capsule, Rfl: 0 .  omeprazole (PRILOSEC) 20 MG capsule, Take 1 capsule (20 mg total) by mouth daily., Disp: 90 capsule, Rfl: 0 .  triamterene-hydrochlorothiazide (MAXZIDE) 75-50 MG tablet, Take 1 tablet by mouth daily., Disp: 90 tablet, Rfl: 0 .  amLODipine (NORVASC) 5 MG tablet, Take 1 tablet (5 mg total) by mouth daily., Disp: 90 tablet, Rfl: 0 .  rosuvastatin (CRESTOR) 10 MG tablet, Take 1 tablet (10 mg total) by mouth daily., Disp: 90 tablet, Rfl: 3  Review of Systems  Constitutional: Negative for appetite change, chills, fatigue and fever.  Respiratory: Negative for chest tightness and shortness of breath.   Cardiovascular: Positive for leg swelling. Negative for chest pain and palpitations.  Gastrointestinal: Negative for abdominal pain, nausea and vomiting.  Neurological: Negative for dizziness  and weakness.    Social History   Tobacco Use  . Smoking status: Current Every Day Smoker    Packs/day: 0.25    Years: 45.00    Pack years: 11.25    Types: Cigarettes  . Smokeless tobacco: Never Used  . Tobacco comment: pt declines quitting at this time  Substance Use Topics  . Alcohol use: Yes    Alcohol/week: 14.0 - 21.0 standard drinks    Types: 14 - 21 Cans of beer per week      Objective:   BP 122/83 (BP Location: Left Arm, Patient Position: Sitting, Cuff Size: Large)   Pulse 86   Temp 97.9 F (36.6 C) (Other (Comment))   Resp 16   Wt 216 lb  (98 kg)   BMI 30.99 kg/m  Vitals:   06/30/19 0912  BP: 122/83  Pulse: 86  Resp: 16  Temp: 97.9 F (36.6 C)  TempSrc: Other (Comment)  Weight: 216 lb (98 kg)     Physical Exam Constitutional:      Appearance: Normal appearance.  Cardiovascular:     Rate and Rhythm: Normal rate.  Pulmonary:     Effort: Pulmonary effort is normal.  Musculoskeletal:     Right lower leg: Edema present.     Left lower leg: Edema present.     Comments: Mild pedal edema   Skin:    General: Skin is warm and dry.  Neurological:     Mental Status: She is alert and oriented to person, place, and time. Mental status is at baseline.  Psychiatric:        Mood and Affect: Mood normal.        Behavior: Behavior normal.      No results found for any visits on 06/30/19.     Assessment & Plan    1. Pedal edema  We will decrease her amlodipine from 10 mg to 5 mg daily to help with swelling.  2. Essential (primary) hypertension  - amLODipine (NORVASC) 5 MG tablet; Take 1 tablet (5 mg total) by mouth daily.  Dispense: 90 tablet; Refill: 0  3. Left lumbar radiculopathy  Will refer to specialist as she is on a complex regimen.   - Ambulatory referral to Orthopedics  4. Hyperlipidemia, unspecified hyperlipidemia type  Will change Lipitor to crestor.   - rosuvastatin (CRESTOR) 10 MG tablet; Take 1 tablet (10 mg total) by mouth daily.  Dispense: 90 tablet; Refill: 3  The entirety of the information documented in the History of Present Illness, Review of Systems and Physical Exam were personally obtained by me. Portions of this information were initially documented by Doran Clay, LPN  and reviewed by me for thoroughness and accuracy.         Trinna Post, PA-C  [facility]

## 2019-06-30 NOTE — Patient Instructions (Signed)

## 2019-07-08 ENCOUNTER — Other Ambulatory Visit: Payer: Self-pay | Admitting: Physician Assistant

## 2019-07-08 DIAGNOSIS — K219 Gastro-esophageal reflux disease without esophagitis: Secondary | ICD-10-CM

## 2019-07-10 MED ORDER — OMEPRAZOLE 20 MG PO CPDR: 20.0000 mg | DELAYED_RELEASE_CAPSULE | Freq: Every day | ORAL | 0 refills | Status: DC

## 2019-07-10 NOTE — Addendum Note (Signed)
Addended by: Molly Maduro on: 07/10/2019 09:49 AM   Modules accepted: Orders

## 2019-07-25 ENCOUNTER — Other Ambulatory Visit: Payer: Self-pay | Admitting: Family Medicine

## 2019-07-28 ENCOUNTER — Ambulatory Visit (INDEPENDENT_AMBULATORY_CARE_PROVIDER_SITE_OTHER): Payer: Medicare Other | Admitting: Physician Assistant

## 2019-07-28 ENCOUNTER — Telehealth: Payer: Self-pay | Admitting: Physician Assistant

## 2019-07-28 ENCOUNTER — Encounter: Payer: Self-pay | Admitting: Physician Assistant

## 2019-07-28 DIAGNOSIS — Z91199 Patient's noncompliance with other medical treatment and regimen due to unspecified reason: Secondary | ICD-10-CM

## 2019-07-28 DIAGNOSIS — Z5329 Procedure and treatment not carried out because of patient's decision for other reasons: Secondary | ICD-10-CM

## 2019-07-28 NOTE — Progress Notes (Signed)
Patient did not answer phone call and will be counted as a no show.

## 2019-07-28 NOTE — Telephone Encounter (Signed)
Called patient for visit x 2 and left voicemail. No answer.

## 2019-07-31 ENCOUNTER — Other Ambulatory Visit: Payer: Self-pay | Admitting: Physician Assistant

## 2019-07-31 DIAGNOSIS — G6289 Other specified polyneuropathies: Secondary | ICD-10-CM

## 2019-08-15 ENCOUNTER — Other Ambulatory Visit: Payer: Self-pay | Admitting: Physician Assistant

## 2019-08-15 DIAGNOSIS — G8929 Other chronic pain: Secondary | ICD-10-CM

## 2019-08-31 ENCOUNTER — Other Ambulatory Visit: Payer: Self-pay | Admitting: Physician Assistant

## 2019-08-31 NOTE — Telephone Encounter (Signed)
appt 09/22/2019

## 2019-09-06 ENCOUNTER — Telehealth: Payer: Self-pay | Admitting: Physician Assistant

## 2019-09-06 NOTE — Telephone Encounter (Signed)
Spoke with patient and she states that her insurance company is allowing someone to come and check her bp and draw labs once a year. She states that she will still have to come to you for her CPE and other check ups. Patient states that it through her plan of coverage. Please advise.

## 2019-09-06 NOTE — Telephone Encounter (Signed)
I don't understand what this means? Does her health insurance provide a service that does home visits? Why does she need a home visit?

## 2019-09-06 NOTE — Telephone Encounter (Signed)
Pamala Hurry w/ [place] [phone]  Calling for pt requesting an order to have her annual wellness visit at home that the insurance pays for.  Please advise.  Thanks, [employer]

## 2019-09-07 NOTE — Telephone Encounter (Signed)
Attempted to reach [facility] with [facility] number is incorrect, unable to reach agent. Contacted patient and she states that she gave insurance verbal approval to come out to her home. KW

## 2019-09-07 NOTE — Telephone Encounter (Signed)
OK it's fine to give order.

## 2019-09-11 ENCOUNTER — Other Ambulatory Visit: Payer: Self-pay | Admitting: Physician Assistant

## 2019-09-12 ENCOUNTER — Ambulatory Visit (INDEPENDENT_AMBULATORY_CARE_PROVIDER_SITE_OTHER): Payer: Medicare Other | Admitting: Physician Assistant

## 2019-09-12 ENCOUNTER — Other Ambulatory Visit: Payer: Self-pay

## 2019-09-12 ENCOUNTER — Encounter: Payer: Self-pay | Admitting: Physician Assistant

## 2019-09-12 VITALS — BP 125/85 | HR 86 | Temp 96.6°F | Wt 219.0 lb

## 2019-09-12 DIAGNOSIS — N898 Other specified noninflammatory disorders of vagina: Secondary | ICD-10-CM | POA: Diagnosis not present

## 2019-09-12 DIAGNOSIS — Z1231 Encounter for screening mammogram for malignant neoplasm of breast: Secondary | ICD-10-CM

## 2019-09-12 DIAGNOSIS — R350 Frequency of micturition: Secondary | ICD-10-CM

## 2019-09-12 LAB — POCT URINALYSIS DIPSTICK
Bilirubin, UA: NEGATIVE
Blood, UA: NEGATIVE
Glucose, UA: NEGATIVE
Ketones, UA: NEGATIVE
Leukocytes, UA: NEGATIVE
Nitrite, UA: NEGATIVE
Protein, UA: NEGATIVE
Spec Grav, UA: 1.01 (ref 1.010–1.025)
Urobilinogen, UA: 0.2 E.U./dL
pH, UA: 6 (ref 5.0–8.0)

## 2019-09-12 MED ORDER — VALACYCLOVIR HCL 1 G PO TABS: 1000.0000 mg | ORAL_TABLET | Freq: Two times a day (BID) | ORAL | 5 refills | Status: AC

## 2019-09-12 NOTE — Patient Instructions (Signed)
Cold Sore  A cold sore, also called a fever blister, is a small, fluid-filled sore that forms inside of the mouth or on the lips, gums, nose, chin, or cheeks. Cold sores can spread to other parts of the body, such as the eyes or fingers. Cold sores can spread from person to person (are contagious) until the sores crust over completely. Most cold sores go away within 2 weeks. What are the causes? Cold sores are caused by a virus (herpes simplex virus type 1, HSV-1). The virus can spread from person to person through close contact, such as through:  Kissing.  Touching the affected area.  Sharing personal items such as lip balm, razors, a drinking glass, or eating utensils. What increases the risk? You are more likely to develop this condition if you:  Are tired, stressed, or sick.  Are having your period (menstruating).  Are pregnant.  Take certain medicines.  Are out in cold weather or get too much sun. What are the signs or symptoms? Symptoms of a cold sore outbreak go through different stages. These are the stages of a cold sore:  Tingling, itching, or burning is felt 1-2 days before the outbreak.  Fluid-filled blisters appear on the lips, inside the mouth, on the nose, or on the cheeks.  The blisters start to ooze clear fluid.  The blisters dry up, and a yellow crust appears in their place.  The crust falls off. In some cases, other symptoms can develop during a cold sore outbreak. These can include:  Fever.  Sore throat.  Headache.  Muscle aches.  Swollen neck glands. How is this treated? There is no cure for cold sores or the virus that causes them. There is also no vaccine to prevent the virus. Most cold sores go away on their own without treatment within 2 weeks. Your doctor may prescribe medicines to:  Help with pain.  Keep the virus from growing.  Help you heal faster. Medicines may be in the form of creams, gels, pills, or a shot. Follow these  instructions at home: Medicines  Take or apply over-the-counter and prescription medicines only as told by your doctor.  Use a cotton-tip swab to apply creams or gels to your sores.  Ask your doctor if you can take lysine supplements. These may help with healing. Sore care   Do not touch the sores or pick the scabs.  Wash your hands often. Do not touch your eyes without washing your hands first.  Keep the sores clean and dry.  If told, put ice on the sores: ? Put ice in a plastic bag. ? Place a towel between your skin and the bag. ? Leave the ice on for 20 minutes, 2-3 times a day. Eating and drinking  Eat a soft, bland diet. Avoid eating hot, cold, or salty foods. These can hurt your mouth.  Use a straw if it hurts to drink out of a glass.  Eat foods that have a lot of lysine in them. These include meat, fish, and dairy products.  Avoid sugary foods, chocolates, nuts, and grains. These foods have a high amount of a substance (arginine) that can cause the virus to grow. Lifestyle  Do not kiss, have oral sex, or share personal items until your sores heal.  Stress, poor sleep, and being out in the sun can trigger outbreaks. Make sure you: ? Do activities that help you relax, such as deep breathing exercises or meditation. ? Get enough sleep. ? Apply sunscreen   on your lips before you go out in the sun. Contact a doctor if:  You have symptoms for more than 2 weeks.  You have pus coming from the sores.  You have redness that is spreading.  You have pain or irritation in your eye.  You get sores on your genitals.  Your sores do not heal within 2 weeks.  You get cold sores often. Get help right away if:  You have a fever and your symptoms suddenly get worse.  You have a headache and confusion.  You have tiredness (fatigue).  You do not want to eat as much as normal (loss of appetite).  You have a stiff neck or are sensitive to light. Summary  A cold sore is  a small, fluid-filled sore that forms inside of the mouth or on the lips, gums, nose, chin, or cheeks.  Cold sores can spread from person to person (are contagious) until the sores crust over completely. Most cold sores go away within 2 weeks.  Wash your hands often. Do not touch your eyes without washing your hands first.  Do not kiss, have oral sex, or share personal items until your sores heal.  Contact a doctor if your sores do not heal within 2 weeks. This information is not intended to replace advice given to you by your health care provider. Make sure you discuss any questions you have with your health care provider. Document Released: 04/26/2012 Document Revised: 02/15/2019 Document Reviewed: 03/28/2018 Elsevier Patient Education  2020 [employer].

## 2019-09-12 NOTE — Progress Notes (Signed)
Patient: Elizabeth Hogan Female    DOB: [birth year]   63 y.o.   MRN: [id number] Visit Date: 09/12/2019  Today's Provider: Trinna Post, PA-C   Chief Complaint  Patient presents with  . Urinary Frequency   Subjective:     Urinary Frequency  This is a new problem. The problem has been unchanged. The quality of the pain is described as stabbing. The pain is at a severity of 6/10. The pain is mild. There has been no fever. Associated symptoms include a discharge, flank pain and frequency. Pertinent negatives include no hesitancy or nausea. She has tried increased fluids for the symptoms.   Reports she has a genital lesion that is burning. She has positive HSV serology for both strains 1&2 from 2018. She reports she has never had an outbreak.   She reports she felt a left breast mass and was not able to schedule mammogram because of this.   Allergies  Allergen Reactions  . Augmentin [Amoxicillin-Pot Clavulanate] Itching  . Codeine Itching  . Codeine Sulfate Itching and Rash     Current Outpatient Medications:  .  albuterol (VENTOLIN HFA) 108 (90 Base) MCG/ACT inhaler, INHALE 2 PUFFS INTO LUNGS EVERY 6 HOURS AS NEEDED FOR WHEEZING OR SHORTNESS OF BREATH, Disp: 9 g, Rfl: 1 .  amLODipine (NORVASC) 5 MG tablet, Take 1 tablet (5 mg total) by mouth daily., Disp: 90 tablet, Rfl: 0 .  celecoxib (CELEBREX) 100 MG capsule, TAKE 1 CAPSULE BY MOUTH ONCE DAILY .PATIENT  NEEDS  LABS  DONE  FOR  FURTHER  REFILLS, Disp: 30 capsule, Rfl: 0 .  chlorzoxazone (PARAFON) 500 MG tablet, TAKE 1 TABLET BY MOUTH TWICE DAILY AS NEEDED FOR MUSCLE SPASM, Disp: 180 tablet, Rfl: 0 .  diphenhydrAMINE (BENADRYL ALLERGY) 25 mg capsule, Take 25 mg by mouth every 6 (six) hours as needed., Disp: , Rfl:  .  gabapentin (NEURONTIN) 800 MG tablet, TAKE 1 TABLET BY MOUTH THREE TIMES DAILY, Disp: 270 tablet, Rfl: 0 .  omeprazole (PRILOSEC) 20 MG capsule, Take 1 capsule (20 mg total) by mouth daily., Disp: 90 capsule,  Rfl: 0 .  rosuvastatin (CRESTOR) 10 MG tablet, Take 1 tablet (10 mg total) by mouth daily., Disp: 90 tablet, Rfl: 3 .  triamterene-hydrochlorothiazide (MAXZIDE) 75-50 MG tablet, Take 1 tablet by mouth daily., Disp: 90 tablet, Rfl: 0 .  nortriptyline (PAMELOR) 75 MG capsule, Take 1 capsule (75 mg total) by mouth at bedtime. (Patient not taking: Reported on 07/28/2019), Disp: 90 capsule, Rfl: 0  Review of Systems  Constitutional: Negative.   Gastrointestinal: Negative for nausea.  Genitourinary: Positive for flank pain, frequency, pelvic pain and vaginal discharge. Negative for hesitancy.  Musculoskeletal: Positive for back pain.  Neurological: Positive for headaches.    Social History   Tobacco Use  . Smoking status: Current Every Day Smoker    Packs/day: 0.25    Years: 45.00    Pack years: 11.25    Types: Cigarettes  . Smokeless tobacco: Never Used  . Tobacco comment: pt declines quitting at this time  Substance Use Topics  . Alcohol use: Yes    Alcohol/week: 14.0 - 21.0 standard drinks    Types: 14 - 21 Cans of beer per week      Objective:   BP 125/85 (BP Location: Left Arm, Patient Position: Sitting, Cuff Size: Normal)   Pulse 86   Temp (!) 96.6 F (35.9 C) (Temporal)   Wt 219 lb (99.3 kg)  BMI 31.42 kg/m  Vitals:   09/12/19 1027  BP: 125/85  Pulse: 86  Temp: (!) 96.6 F (35.9 C)  TempSrc: Temporal  Weight: 219 lb (99.3 kg)  Body mass index is 31.42 kg/m.   Physical Exam Exam conducted with a chaperone present.  Constitutional:      Appearance: Normal appearance.  Chest:     Breasts:        Right: Normal. No mass.        Left: Normal. No mass.  Genitourinary:   Skin:    General: Skin is warm and dry.  Neurological:     Mental Status: She is alert and oriented to person, place, and time. Mental status is at baseline.  Psychiatric:        Mood and Affect: Mood normal.        Behavior: Behavior normal.      No results found for any visits on  09/12/19.     Assessment & Plan    1. Urinary frequency  UA normal, send for cx.   - POCT urinalysis dipstick - CULTURE, URINE COMPREHENSIVE  2. Vaginal lesion  Appears to be HSV outbreak. Swab for confirmation and tx with valtrex.   - Herpes simplex virus culture - valACYclovir (VALTREX) 1000 MG tablet; Take 1 tablet (1,000 mg total) by mouth 2 (two) times daily.  Dispense: 20 tablet; Refill: 5  3. Encounter for screening mammogram for malignant neoplasm of breast  No mass appreciated. Ordered mammogram and advised she should be able to schedule.   The entirety of the information documented in the History of Present Illness, Review of Systems and Physical Exam were personally obtained by me. Portions of this information were initially documented by [facility], CMA and reviewed by me for thoroughness and accuracy.   F/u PRN        Trinna Post, PA-C  [facility]

## 2019-09-15 LAB — HERPES SIMPLEX VIRUS CULTURE

## 2019-09-22 ENCOUNTER — Ambulatory Visit: Payer: Self-pay | Admitting: Physician Assistant

## 2019-09-26 ENCOUNTER — Encounter: Payer: Self-pay | Admitting: Physician Assistant

## 2019-09-26 ENCOUNTER — Ambulatory Visit (INDEPENDENT_AMBULATORY_CARE_PROVIDER_SITE_OTHER): Payer: Medicare Other | Admitting: Physician Assistant

## 2019-09-26 ENCOUNTER — Other Ambulatory Visit: Payer: Self-pay

## 2019-09-26 ENCOUNTER — Other Ambulatory Visit: Payer: Self-pay | Admitting: Physician Assistant

## 2019-09-26 VITALS — BP 130/81 | HR 83 | Temp 96.8°F

## 2019-09-26 DIAGNOSIS — E785 Hyperlipidemia, unspecified: Secondary | ICD-10-CM

## 2019-09-26 DIAGNOSIS — I10 Essential (primary) hypertension: Secondary | ICD-10-CM | POA: Diagnosis not present

## 2019-09-26 DIAGNOSIS — F17200 Nicotine dependence, unspecified, uncomplicated: Secondary | ICD-10-CM | POA: Diagnosis not present

## 2019-09-26 MED ORDER — LOSARTAN POTASSIUM 50 MG PO TABS: 50.0000 mg | ORAL_TABLET | Freq: Every day | ORAL | 0 refills | Status: DC

## 2019-09-26 MED ORDER — CHANTIX STARTING MONTH PAK 0.5 MG X 11 & 1 MG X 42 PO TABS: ORAL_TABLET | ORAL | 0 refills | Status: DC

## 2019-09-26 NOTE — Patient Instructions (Signed)

## 2019-09-26 NOTE — Progress Notes (Signed)
Patient: Elizabeth Hogan Female    DOB: 03-10-1956   63 y.o.   MRN: [id number] Visit Date: 09/26/2019  Today's Provider: Trinna Post, PA-C   Chief Complaint  Patient presents with  . Hypertension   Subjective:     HPI   Hypertension, follow-up:  BP Readings from Last 3 Encounters:  09/26/19 130/81  09/12/19 125/85  06/30/19 122/83    She was last seen for hypertension 6 months ago.  BP at that visit was 118/84. Management since that visit includes continue medications.She reports good compliance with treatment. She is not having side effects.  She is exercising. She is adherent to low salt diet.   Outside blood pressures are . She is experiencing none.  Patient denies .   Cardiovascular risk factors include hypertension.  Use of agents associated with hypertension: none.   She is having urinary frequency.  ------------------------------------------------------------------------       Allergies  Allergen Reactions  . Augmentin [Amoxicillin-Pot Clavulanate] Itching  . Codeine Itching  . Codeine Sulfate Itching and Rash     Current Outpatient Medications:  .  albuterol (VENTOLIN HFA) 108 (90 Base) MCG/ACT inhaler, INHALE 2 PUFFS INTO LUNGS EVERY 6 HOURS AS NEEDED FOR WHEEZING OR SHORTNESS OF BREATH, Disp: 9 g, Rfl: 1 .  amLODipine (NORVASC) 5 MG tablet, Take 1 tablet (5 mg total) by mouth daily., Disp: 90 tablet, Rfl: 0 .  celecoxib (CELEBREX) 100 MG capsule, TAKE 1 CAPSULE BY MOUTH ONCE DAILY .PATIENT  NEEDS  LABS  DONE  FOR  FURTHER  REFILLS, Disp: 30 capsule, Rfl: 0 .  chlorzoxazone (PARAFON) 500 MG tablet, TAKE 1 TABLET BY MOUTH TWICE DAILY AS NEEDED FOR MUSCLE SPASM, Disp: 180 tablet, Rfl: 0 .  diphenhydrAMINE (BENADRYL ALLERGY) 25 mg capsule, Take 25 mg by mouth every 6 (six) hours as needed., Disp: , Rfl:  .  gabapentin (NEURONTIN) 800 MG tablet, TAKE 1 TABLET BY MOUTH THREE TIMES DAILY, Disp: 270 tablet, Rfl: 0 .  nortriptyline (PAMELOR)  75 MG capsule, Take 1 capsule (75 mg total) by mouth at bedtime., Disp: 90 capsule, Rfl: 0 .  omeprazole (PRILOSEC) 20 MG capsule, Take 1 capsule (20 mg total) by mouth daily., Disp: 90 capsule, Rfl: 0 .  rosuvastatin (CRESTOR) 10 MG tablet, Take 1 tablet (10 mg total) by mouth daily., Disp: 90 tablet, Rfl: 3 .  triamterene-hydrochlorothiazide (MAXZIDE) 75-50 MG tablet, Take 1 tablet by mouth daily., Disp: 90 tablet, Rfl: 0 .  valACYclovir (VALTREX) 1000 MG tablet, Take 1 tablet (1,000 mg total) by mouth 2 (two) times daily., Disp: 20 tablet, Rfl: 5  Review of Systems  Constitutional: Negative.   Respiratory: Negative.   Genitourinary: Negative.   Neurological: Negative.     Social History   Tobacco Use  . Smoking status: Current Every Day Smoker    Packs/day: 0.25    Years: 45.00    Pack years: 11.25    Types: Cigarettes  . Smokeless tobacco: Never Used  . Tobacco comment: pt declines quitting at this time  Substance Use Topics  . Alcohol use: Yes    Alcohol/week: 14.0 - 21.0 standard drinks    Types: 14 - 21 Cans of beer per week      Objective:   BP 130/81 (BP Location: Left Arm, Patient Position: Sitting, Cuff Size: Normal)   Pulse 83   Temp (!) 96.8 F (36 C) (Temporal)  Vitals:   09/26/19 1553  BP: 130/81  Pulse:  83  Temp: (!) 96.8 F (36 C)  TempSrc: Temporal  There is no height or weight on file to calculate BMI.   Physical Exam Constitutional:      Appearance: Normal appearance.  Cardiovascular:     Rate and Rhythm: Normal rate and regular rhythm.     Heart sounds: Normal heart sounds.  Pulmonary:     Effort: Pulmonary effort is normal.     Breath sounds: Normal breath sounds.  Skin:    General: Skin is warm and dry.  Neurological:     Mental Status: She is alert and oriented to person, place, and time. Mental status is at baseline.  Psychiatric:        Mood and Affect: Mood normal.        Behavior: Behavior normal.      No results found for  any visits on 09/26/19.     Assessment & Plan    1. Essential (primary) hypertension  - losartan (COZAAR) 50 MG tablet; Take 1 tablet (50 mg total) by mouth daily.  Dispense: 90 tablet; Refill: 0 - Comprehensive Metabolic Panel (CMET) - Lipid Profile  2. Tobacco use disorder  I have counseled > 3 minutes about benefits of quitting smoking.   - varenicline (CHANTIX STARTING MONTH PAK) 0.5 MG X 11 & 1 MG X 42 tablet; Take one 0.5 mg tablet by mouth once daily for 3 days, then increase to one 0.5 mg tablet twice daily for 4 days, then increase to one 1 mg tablet twice daily.  Dispense: 53 tablet; Refill: 0  3. HLD  Continue crestor  The entirety of the information documented in the History of Present Illness, Review of Systems and Physical Exam were personally obtained by me. Portions of this information were initially documented by [facility], CMA and reviewed by me for thoroughness and accuracy.         Trinna Post, PA-C  [facility]

## 2019-09-27 LAB — COMPREHENSIVE METABOLIC PANEL
ALT: 16 IU/L (ref 0–32)
AST: 22 IU/L (ref 0–40)
Albumin/Globulin Ratio: 1.9 (ref 1.2–2.2)
Albumin: 4.6 g/dL (ref 3.8–4.8)
Alkaline Phosphatase: 72 IU/L (ref 39–117)
BUN/Creatinine Ratio: 18 (ref 12–28)
BUN: 20 mg/dL (ref 8–27)
Bilirubin Total: 0.7 mg/dL (ref 0.0–1.2)
CO2: 25 mmol/L (ref 20–29)
Calcium: 9.8 mg/dL (ref 8.7–10.3)
Chloride: 102 mmol/L (ref 96–106)
Creatinine, Ser: 1.13 mg/dL — ABNORMAL HIGH (ref 0.57–1.00)
GFR calc Af Amer: 60 mL/min/{1.73_m2} (ref >59)
GFR calc non Af Amer: 52 mL/min/{1.73_m2} — ABNORMAL LOW (ref >59)
Globulin, Total: 2.4 g/dL (ref 1.5–4.5)
Glucose: 88 mg/dL (ref 65–99)
Potassium: 3.5 mmol/L (ref 3.5–5.2)
Sodium: 141 mmol/L (ref 134–144)
Total Protein: 7 g/dL (ref 6.0–8.5)

## 2019-09-27 LAB — LIPID PANEL
Chol/HDL Ratio: 3.2 ratio (ref 0.0–4.4)
Cholesterol, Total: 181 mg/dL (ref 100–199)
HDL: 56 mg/dL (ref >39)
LDL Chol Calc (NIH): 102 mg/dL — ABNORMAL HIGH (ref 0–99)
Triglycerides: 128 mg/dL (ref 0–149)
VLDL Cholesterol Cal: 23 mg/dL (ref 5–40)

## 2019-09-28 ENCOUNTER — Other Ambulatory Visit: Payer: Self-pay | Admitting: Physician Assistant

## 2019-09-28 DIAGNOSIS — K219 Gastro-esophageal reflux disease without esophagitis: Secondary | ICD-10-CM

## 2019-09-29 ENCOUNTER — Telehealth: Payer: Self-pay

## 2019-09-29 DIAGNOSIS — R7989 Other specified abnormal findings of blood chemistry: Secondary | ICD-10-CM

## 2019-09-29 NOTE — Telephone Encounter (Signed)
-----   Message from Trinna Post, [place] sent at 09/27/2019 11:57 AM EST ----- Her kidney function is a little lower than normal. I would recommend pushing fluids. She is also taking celebrex daily which can injure kidneys. I would suggest she minimize or discontinue this. Can we order CMET for her to recheck in one month, lab only visit under dx elevated creatinine? Thanks.

## 2019-09-29 NOTE — Telephone Encounter (Signed)
LVMTRC 

## 2019-10-02 DIAGNOSIS — E785 Hyperlipidemia, unspecified: Secondary | ICD-10-CM | POA: Insufficient documentation

## 2019-10-02 NOTE — Telephone Encounter (Signed)
Patient was advised and states that she will return in one month to get labs recheck. Patient states that she will cut back on her Celebrex.

## 2019-10-09 ENCOUNTER — Telehealth: Payer: Self-pay | Admitting: Physician Assistant

## 2019-10-09 NOTE — Telephone Encounter (Addendum)
Patient was advised and states that medication has been send in before.FYI

## 2019-10-09 NOTE — Telephone Encounter (Signed)
No needs evaluation.

## 2019-10-09 NOTE — Telephone Encounter (Signed)
Copied from [place] [id number]. Topic: General - Inquiry >> Oct 09, 2019 11:17 AM Richardo Priest, NT wrote: Reason for CRM: Pt called in stating she would like to see if she can receive an antibiotic medication for her possible kidney infection. Pt would like this before her appointment Wednesday to see if it will work and fix issue. Please advise.

## 2019-10-11 ENCOUNTER — Ambulatory Visit (INDEPENDENT_AMBULATORY_CARE_PROVIDER_SITE_OTHER): Payer: Medicare Other | Admitting: Physician Assistant

## 2019-10-11 ENCOUNTER — Other Ambulatory Visit: Payer: Self-pay

## 2019-10-11 ENCOUNTER — Encounter: Payer: Self-pay | Admitting: Physician Assistant

## 2019-10-11 VITALS — BP 146/96 | HR 82 | Temp 96.8°F | Wt 222.2 lb

## 2019-10-11 DIAGNOSIS — G8929 Other chronic pain: Secondary | ICD-10-CM | POA: Diagnosis not present

## 2019-10-11 DIAGNOSIS — M5440 Lumbago with sciatica, unspecified side: Secondary | ICD-10-CM | POA: Diagnosis not present

## 2019-10-11 DIAGNOSIS — I10 Essential (primary) hypertension: Secondary | ICD-10-CM | POA: Diagnosis not present

## 2019-10-11 MED ORDER — TRIAMTERENE-HCTZ 37.5-25 MG PO TABS: 1.0000 | ORAL_TABLET | Freq: Every day | ORAL | 0 refills | Status: DC

## 2019-10-11 MED ORDER — CHLORZOXAZONE 500 MG PO TABS: ORAL_TABLET | ORAL | 0 refills | Status: DC

## 2019-10-11 NOTE — Patient Instructions (Signed)

## 2019-10-11 NOTE — Progress Notes (Signed)
Patient: Elizabeth Hogan Female    DOB: 02-28-1956   63 y.o.   MRN: [id number] Visit Date: 10/11/2019  Today's Provider: Trinna Post, PA-C   Chief Complaint  Patient presents with  . Follow-up   Subjective:     HPI Patient states that when she started the Losartan it caused her to have abdominal cramps/ pain with urinating frequency/urgency. Patient states that she has stopped taking the medication and the pain has stopped.  HTN: Patient has been on a variety of medications for HTN with varying degrees of success. At one point she was on amlodipine 10 mg daily however this was decreased due to pedal edema. The edema resolved after she started taking the 5 mg QD dose. Additionally, she was on maxzide which was discontinued due to urinary frequency. Last visit patient was started on losartan 50 mg in addition to 5 mg amlodipine which she reports caused back pain and more frequent urination. She reports on 10/09/2019 she stopped the medication and these symptoms resolved. She reports she has started taking maxzide again.   BP Readings from Last 3 Encounters:  10/11/19 (!) 146/96  09/26/19 130/81  09/12/19 125/85   Elevated serum creatinine: Noted on last labs. Patient has been taking celebrex for at least 4-5 years. She is also treated for HTN. Since her las lab, she has stopped celebrex.   CMP Latest Ref Rng & Units 09/26/2019 03/22/2019 11/18/2017  Glucose 65 - 99 mg/dL 88 101(H) 89  BUN 8 - 27 mg/dL 20 23 16   Creatinine 0.57 - 1.00 mg/dL 1.13(H) 0.86 0.76  Sodium 134 - 144 mmol/L 141 141 140  Potassium 3.5 - 5.2 mmol/L 3.5 3.9 3.1(L)  Chloride 96 - 106 mmol/L 102 100 104  CO2 20 - 29 mmol/L 25 24 26   Calcium 8.7 - 10.3 mg/dL 9.8 9.7 9.6  Total Protein 6.0 - 8.5 g/dL 7.0 7.3 7.8  Total Bilirubin 0.0 - 1.2 mg/dL 0.7 0.3 0.7  Alkaline Phos 39 - 117 IU/L 72 78 71  AST 0 - 40 IU/L 22 31 30   ALT 0 - 32 IU/L 16 36(H) 17        Allergies  Allergen Reactions  .  Augmentin [Amoxicillin-Pot Clavulanate] Itching  . Codeine Itching  . Codeine Sulfate Itching and Rash     Current Outpatient Medications:  .  albuterol (VENTOLIN HFA) 108 (90 Base) MCG/ACT inhaler, INHALE 2 PUFFS INTO LUNGS EVERY 6 HOURS AS NEEDED FOR WHEEZING OR SHORTNESS OF BREATH, Disp: 9 g, Rfl: 1 .  amLODipine (NORVASC) 5 MG tablet, Take 1 tablet (5 mg total) by mouth daily., Disp: 90 tablet, Rfl: 0 .  celecoxib (CELEBREX) 100 MG capsule, TAKE 1 CAPSULE BY MOUTH ONCE DAILY .PATIENT  NEEDS  LABS  DONE  FOR  FURTHER  REFILLS, Disp: 30 capsule, Rfl: 0 .  chlorzoxazone (PARAFON) 500 MG tablet, TAKE 1 TABLET BY MOUTH TWICE DAILY AS NEEDED FOR MUSCLE SPASM, Disp: 180 tablet, Rfl: 0 .  diphenhydrAMINE (BENADRYL ALLERGY) 25 mg capsule, Take 25 mg by mouth every 6 (six) hours as needed., Disp: , Rfl:  .  gabapentin (NEURONTIN) 800 MG tablet, TAKE 1 TABLET BY MOUTH THREE TIMES DAILY, Disp: 270 tablet, Rfl: 0 .  nortriptyline (PAMELOR) 75 MG capsule, Take 1 capsule (75 mg total) by mouth at bedtime., Disp: 90 capsule, Rfl: 0 .  omeprazole (PRILOSEC) 20 MG capsule, Take 1 capsule by mouth once daily, Disp: 90 capsule, Rfl: 0 .  rosuvastatin (CRESTOR) 10 MG tablet, Take 1 tablet (10 mg total) by mouth daily., Disp: 90 tablet, Rfl: 3 .  valACYclovir (VALTREX) 1000 MG tablet, Take 1 tablet (1,000 mg total) by mouth 2 (two) times daily., Disp: 20 tablet, Rfl: 5 .  varenicline (CHANTIX STARTING MONTH PAK) 0.5 MG X 11 & 1 MG X 42 tablet, Take one 0.5 mg tablet by mouth once daily for 3 days, then increase to one 0.5 mg tablet twice daily for 4 days, then increase to one 1 mg tablet twice daily., Disp: 53 tablet, Rfl: 0 .  losartan (COZAAR) 50 MG tablet, Take 1 tablet (50 mg total) by mouth daily. (Patient not taking: Reported on 10/11/2019), Disp: 90 tablet, Rfl: 0 .  triamterene-hydrochlorothiazide (MAXZIDE-25) 37.5-25 MG tablet, Take 1 tablet by mouth daily., Disp: 90 tablet, Rfl: 0  Review of Systems   Constitutional: Negative.   Respiratory: Negative.   Gastrointestinal: Positive for abdominal pain.  Genitourinary: Positive for frequency and urgency.  Neurological: Negative.     Social History   Tobacco Use  . Smoking status: Current Every Day Smoker    Packs/day: 0.25    Years: 45.00    Pack years: 11.25    Types: Cigarettes  . Smokeless tobacco: Never Used  . Tobacco comment: pt declines quitting at this time  Substance Use Topics  . Alcohol use: Yes    Alcohol/week: 14.0 - 21.0 standard drinks    Types: 14 - 21 Cans of beer per week      Objective:   BP (!) 146/96 (BP Location: Left Arm, Patient Position: Sitting, Cuff Size: Normal)   Pulse 82   Temp (!) 96.8 F (36 C) (Temporal)   Wt 222 lb 3.2 oz (100.8 kg)   BMI 31.88 kg/m  Vitals:   10/11/19 1330  BP: (!) 146/96  Pulse: 82  Temp: (!) 96.8 F (36 C)  TempSrc: Temporal  Weight: 222 lb 3.2 oz (100.8 kg)  Body mass index is 31.88 kg/m.   Physical Exam Constitutional:      Appearance: Normal appearance.  Cardiovascular:     Rate and Rhythm: Normal rate and regular rhythm.     Heart sounds: Normal heart sounds.  Pulmonary:     Effort: Pulmonary effort is normal.     Breath sounds: Normal breath sounds.  Abdominal:     General: Bowel sounds are normal.     Palpations: Abdomen is soft.     Tenderness: There is no right CVA tenderness or left CVA tenderness.  Skin:    General: Skin is warm and dry.  Neurological:     Mental Status: She is alert and oriented to person, place, and time. Mental status is at baseline.  Psychiatric:        Mood and Affect: Mood normal.        Behavior: Behavior normal.      No results found for any visits on 10/11/19.     Assessment & Plan    1. Essential (primary) hypertension   We will have her start back on maxzide and see if she tolerates it this time to treat her HTN. Have counseled Scr could have increased due to a variety of reasons including dehydration,  NSAIDs, and HTN kidney disease. Will recheck lab. She has follow up 12/2019. Urinalysis is negative today.   - triamterene-hydrochlorothiazide (MAXZIDE-25) 37.5-25 MG tablet; Take 1 tablet by mouth daily.  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 Portions of this information were initially documented by [facility], CMA and reviewed by me for thoroughness and accuracy.         Trinna Post, PA-C  [facility]

## 2019-10-12 ENCOUNTER — Telehealth: Payer: Self-pay

## 2019-10-12 LAB — COMPREHENSIVE METABOLIC PANEL WITH GFR
ALT: 16 IU/L (ref 0–32)
AST: 20 IU/L (ref 0–40)
Albumin/Globulin Ratio: 1.8 (ref 1.2–2.2)
Albumin: 4.7 g/dL (ref 3.8–4.8)
Alkaline Phosphatase: 79 IU/L (ref 39–117)
BUN/Creatinine Ratio: 15 (ref 12–28)
BUN: 15 mg/dL (ref 8–27)
Bilirubin Total: 0.6 mg/dL (ref 0.0–1.2)
CO2: 24 mmol/L (ref 20–29)
Calcium: 10 mg/dL (ref 8.7–10.3)
Chloride: 100 mmol/L (ref 96–106)
Creatinine, Ser: 0.98 mg/dL (ref 0.57–1.00)
GFR calc Af Amer: 71 mL/min/1.73
GFR calc non Af Amer: 62 mL/min/1.73
Globulin, Total: 2.6 g/dL (ref 1.5–4.5)
Glucose: 88 mg/dL (ref 65–99)
Potassium: 3.8 mmol/L (ref 3.5–5.2)
Sodium: 137 mmol/L (ref 134–144)
Total Protein: 7.3 g/dL (ref 6.0–8.5)

## 2019-10-12 NOTE — Telephone Encounter (Signed)
Pt advised.  She states she is not using Celebrex anymore.   Thanks,   -Mickel Baas

## 2019-10-12 NOTE — Telephone Encounter (Signed)
-----   Message from Trinna Post, [place] sent at 10/12/2019  8:26 AM EST ----- Kidney function looks back to normal. I would try to limit or eliminate use of celebrex if she is able to.

## 2019-10-18 ENCOUNTER — Telehealth: Payer: Self-pay

## 2019-10-18 DIAGNOSIS — G6289 Other specified polyneuropathies: Secondary | ICD-10-CM

## 2019-10-18 MED ORDER — GABAPENTIN 800 MG PO TABS: 800.0000 mg | ORAL_TABLET | Freq: Three times a day (TID) | ORAL | 0 refills | Status: DC

## 2019-10-18 NOTE — Telephone Encounter (Signed)
Patient pharmacy is requesting a refill on her Gabapentin 800 mg.L.O.V. was on 10/11/2019.

## 2019-11-05 ENCOUNTER — Other Ambulatory Visit: Payer: Self-pay | Admitting: Physician Assistant

## 2019-11-05 DIAGNOSIS — I10 Essential (primary) hypertension: Secondary | ICD-10-CM

## 2019-12-04 ENCOUNTER — Other Ambulatory Visit: Payer: Self-pay | Admitting: Physician Assistant

## 2019-12-04 DIAGNOSIS — M5440 Lumbago with sciatica, unspecified side: Secondary | ICD-10-CM

## 2019-12-04 DIAGNOSIS — G8929 Other chronic pain: Secondary | ICD-10-CM

## 2019-12-04 MED ORDER — CHLORZOXAZONE 500 MG PO TABS: ORAL_TABLET | ORAL | 0 refills | Status: DC

## 2019-12-04 NOTE — Telephone Encounter (Signed)
Medication Refill - Medication: chlorzoxazone (PARAFON) 500 MG tablet  Pt called to report that her pharmacy says she cant get another refill until the Rx is corrected. Please advise   Has the patient contacted their pharmacy? Yes.   (Agent: If no, request that the patient contact the pharmacy for the refill.) (Agent: If yes, when and what did the pharmacy advise?)  Preferred Pharmacy (with phone number or [place] name):  [facility] [place] [facility] [place] [address] [place] [address]  Phone: [phone] Fax: [phone]     Agent: Please be advised that RX refills may take up to 3 business days. We ask that you follow-up with your pharmacy.

## 2019-12-27 ENCOUNTER — Other Ambulatory Visit: Payer: Self-pay

## 2019-12-27 ENCOUNTER — Encounter: Payer: Self-pay | Admitting: Physician Assistant

## 2019-12-27 ENCOUNTER — Ambulatory Visit (INDEPENDENT_AMBULATORY_CARE_PROVIDER_SITE_OTHER): Payer: Medicare Other | Admitting: Physician Assistant

## 2019-12-27 VITALS — BP 130/88 | Temp 96.6°F | Wt 225.2 lb

## 2019-12-27 DIAGNOSIS — M5440 Lumbago with sciatica, unspecified side: Secondary | ICD-10-CM

## 2019-12-27 DIAGNOSIS — G8929 Other chronic pain: Secondary | ICD-10-CM

## 2019-12-27 DIAGNOSIS — I10 Essential (primary) hypertension: Secondary | ICD-10-CM | POA: Diagnosis not present

## 2019-12-27 DIAGNOSIS — F1010 Alcohol abuse, uncomplicated: Secondary | ICD-10-CM

## 2019-12-27 DIAGNOSIS — K219 Gastro-esophageal reflux disease without esophagitis: Secondary | ICD-10-CM

## 2019-12-27 DIAGNOSIS — E785 Hyperlipidemia, unspecified: Secondary | ICD-10-CM

## 2019-12-27 DIAGNOSIS — G6289 Other specified polyneuropathies: Secondary | ICD-10-CM

## 2019-12-27 MED ORDER — CHLORZOXAZONE 500 MG PO TABS: 500.0000 mg | ORAL_TABLET | Freq: Four times a day (QID) | ORAL | 0 refills | Status: DC | PRN

## 2019-12-27 MED ORDER — OMEPRAZOLE 20 MG PO CPDR: 20.0000 mg | DELAYED_RELEASE_CAPSULE | Freq: Every day | ORAL | 3 refills | Status: DC

## 2019-12-27 MED ORDER — GABAPENTIN 800 MG PO TABS: 800.0000 mg | ORAL_TABLET | Freq: Three times a day (TID) | ORAL | 1 refills | Status: DC

## 2019-12-27 NOTE — Progress Notes (Signed)
Patient: Elizabeth Hogan Female    DOB: February 26, 1956   64 y.o.   MRN: [id number] Visit Date: 12/27/2019  Today's Provider: Trinna Post, PA-C   Chief Complaint  Patient presents with  . Hypertension   Subjective:    I, Elizabeth Hogan,CMA am acting as a scribe for [employer].  HPI   Hypertension, follow-up:  BP Readings from Last 3 Encounters:  12/27/19 130/88  10/11/19 (!) 146/96  09/26/19 130/81    She was last seen for hypertension 3 months ago.  BP at that visit was 130/81. Management since that visit includes triamterene-hydrochlorothiazide 37.5-25 MG tablet . She reports good compliance with treatment. She is not having side effects.  She is not exercising. She is adherent to low salt diet.   Outside blood pressures are not checked. She is experiencing none.  Patient denies chest pain, chest pressure/discomfort, fatigue, irregular heart beat, lower extremity edema and palpitations.   Cardiovascular risk factors include hypertension.  Use of agents associated with hypertension: none.     Weight trend: stable Wt Readings from Last 3 Encounters:  12/27/19 225 lb 3.2 oz (102.2 kg)  10/11/19 222 lb 3.2 oz (100.8 kg)  09/12/19 219 lb (99.3 kg)    Current diet: well balanced  ------------------------------------------------------------------------   Allergies  Allergen Reactions  . Augmentin [Amoxicillin-Pot Clavulanate] Itching  . Codeine Itching  . Codeine Sulfate Itching and Rash     Current Outpatient Medications:  .  albuterol (VENTOLIN HFA) 108 (90 Base) MCG/ACT inhaler, INHALE 2 PUFFS INTO LUNGS EVERY 6 HOURS AS NEEDED FOR WHEEZING OR SHORTNESS OF BREATH, Disp: 9 g, Rfl: 1 .  amLODipine (NORVASC) 5 MG tablet, Take 1 tablet by mouth once daily, Disp: 90 tablet, Rfl: 0 .  celecoxib (CELEBREX) 100 MG capsule, TAKE 1 CAPSULE BY MOUTH ONCE DAILY .PATIENT  NEEDS  LABS  DONE  FOR  FURTHER  REFILLS, Disp: 30 capsule, Rfl: 0 .   chlorzoxazone (PARAFON) 500 MG tablet, TAKE 1 TABLET BY MOUTH TWICE DAILY AS NEEDED FOR MUSCLE SPASM, Disp: 180 tablet, Rfl: 0 .  diphenhydrAMINE (BENADRYL ALLERGY) 25 mg capsule, Take 25 mg by mouth every 6 (six) hours as needed., Disp: , Rfl:  .  gabapentin (NEURONTIN) 800 MG tablet, Take 1 tablet (800 mg total) by mouth 3 (three) times daily., Disp: 270 tablet, Rfl: 0 .  nortriptyline (PAMELOR) 75 MG capsule, Take 1 capsule (75 mg total) by mouth at bedtime., Disp: 90 capsule, Rfl: 0 .  omeprazole (PRILOSEC) 20 MG capsule, Take 1 capsule by mouth once daily, Disp: 90 capsule, Rfl: 0 .  rosuvastatin (CRESTOR) 10 MG tablet, Take 1 tablet (10 mg total) by mouth daily., Disp: 90 tablet, Rfl: 3 .  triamterene-hydrochlorothiazide (MAXZIDE-25) 37.5-25 MG tablet, Take 1 tablet by mouth daily., Disp: 90 tablet, Rfl: 0 .  valACYclovir (VALTREX) 1000 MG tablet, Take 1 tablet (1,000 mg total) by mouth 2 (two) times daily., Disp: 20 tablet, Rfl: 5 .  varenicline (CHANTIX STARTING MONTH PAK) 0.5 MG X 11 & 1 MG X 42 tablet, Take one 0.5 mg tablet by mouth once daily for 3 days, then increase to one 0.5 mg tablet twice daily for 4 days, then increase to one 1 mg tablet twice daily., Disp: 53 tablet, Rfl: 0  Review of Systems  Social History   Tobacco Use  . Smoking status: Current Every Day Smoker    Packs/day: 0.25    Years: 45.00  Pack years: 11.25    Types: Cigarettes  . Smokeless tobacco: Never Used  . Tobacco comment: pt declines quitting at this time  Substance Use Topics  . Alcohol use: Yes    Alcohol/week: 14.0 - 21.0 standard drinks    Types: 14 - 21 Cans of beer per week      Objective:   BP 130/88 (BP Location: Left Arm, Patient Position: Sitting, Cuff Size: Normal)   Temp (!) 96.6 F (35.9 C) (Temporal)   Wt 225 lb 3.2 oz (102.2 kg)   BMI 32.31 kg/m  Vitals:   12/27/19 0852  BP: 130/88  Temp: (!) 96.6 F (35.9 C)  TempSrc: Temporal  Weight: 225 lb 3.2 oz (102.2 kg)  Body  mass index is 32.31 kg/m.   Physical Exam Constitutional:      Appearance: Normal appearance.  Cardiovascular:     Rate and Rhythm: Normal rate and regular rhythm.     Heart sounds: Normal heart sounds.  Pulmonary:     Effort: Pulmonary effort is normal.     Breath sounds: Normal breath sounds.  Skin:    General: Skin is warm and dry.  Neurological:     Mental Status: She is alert and oriented to person, place, and time. Mental status is at baseline.  Psychiatric:        Mood and Affect: Mood normal.        Behavior: Behavior normal.      No results found for any visits on 12/27/19.     Assessment & Plan    1. Chronic low back pain with sciatica, sciatica laterality unspecified, unspecified back pain laterality  Patient has been to neurology in [place] previously but this is far to drive. Recommend she follow up with pain management as we have maximized conservative therapy.   - chlorzoxazone (PARAFON) 500 MG tablet; Take 1 tablet (500 mg total) by mouth every 6 (six) hours as needed for muscle spasms. TAKE 1 TABLET BY MOUTH TWICE DAILY AS NEEDED FOR MUSCLE SPASM  Dispense: 360 tablet; Refill: 0 - Ambulatory referral to Pain Clinic  2. Alcohol abuse  4-5 beers nightly. Counseled limit is on per night.  3. Hyperlipidemia, unspecified hyperlipidemia type   4. Essential (primary) hypertension  Controlled, follow up in 6 months.   5. Gastroesophageal reflux disease  - omeprazole (PRILOSEC) 20 MG capsule; Take 1 capsule (20 mg total) by mouth daily.  Dispense: 90 capsule; Refill: 3  6. Other polyneuropathy  - gabapentin (NEURONTIN) 800 MG tablet; Take 1 tablet (800 mg total) by mouth 3 (three) times daily.  Dispense: 270 tablet; Refill: 1  The entirety of the information documented in the History of Present Illness, Review of Systems and Physical Exam were personally obtained by me. Portions of this information were initially documented by [facility] and  reviewed by me for thoroughness and accuracy.       Trinna Post, PA-C  [facility]

## 2020-01-16 ENCOUNTER — Ambulatory Visit
Admission: RE | Admit: 2020-01-16 | Discharge: 2020-01-16 | Disposition: A | Discharge status: Home / Self Care | Payer: Medicare Other | Source: Ambulatory Visit | Attending: Physician Assistant | Admitting: Physician Assistant

## 2020-01-16 DIAGNOSIS — Z1231 Encounter for screening mammogram for malignant neoplasm of breast: Secondary | ICD-10-CM

## 2020-01-19 ENCOUNTER — Other Ambulatory Visit: Payer: Self-pay | Admitting: Physician Assistant

## 2020-01-19 DIAGNOSIS — I10 Essential (primary) hypertension: Secondary | ICD-10-CM

## 2020-01-25 ENCOUNTER — Ambulatory Visit (INDEPENDENT_AMBULATORY_CARE_PROVIDER_SITE_OTHER): Payer: Medicare Other | Admitting: Physician Assistant

## 2020-01-25 DIAGNOSIS — R059 Cough, unspecified: Secondary | ICD-10-CM

## 2020-01-25 DIAGNOSIS — R5383 Other fatigue: Secondary | ICD-10-CM

## 2020-01-25 DIAGNOSIS — R110 Nausea: Secondary | ICD-10-CM

## 2020-01-25 DIAGNOSIS — R05 Cough: Secondary | ICD-10-CM

## 2020-01-25 MED ORDER — ONDANSETRON HCL 4 MG PO TABS: 4.0000 mg | ORAL_TABLET | Freq: Three times a day (TID) | ORAL | 0 refills | Status: DC | PRN

## 2020-01-25 MED ORDER — BENZONATATE 100 MG PO CAPS: 100.0000 mg | ORAL_CAPSULE | Freq: Three times a day (TID) | ORAL | 0 refills | Status: AC | PRN

## 2020-01-25 NOTE — Progress Notes (Signed)
Patient: Elizabeth Hogan Female    DOB: Jun 23, 1956   64 y.o.   MRN: [id number] Visit Date: 01/25/2020  Today's Provider: Trinna Post, PA-C   No chief complaint on file.  Subjective:    Virtual Visit via Telephone Note  I connected with [place] on 01/25/20 at  8:20 AM EDT by telephone and verified that I am speaking with the correct person using two identifiers.  Location: Patient: Home Provider: Office   I discussed the limitations, risks, security and privacy concerns of performing an evaluation and management service by telephone and the availability of in person appointments. I also discussed with the patient that there may be a patient responsible charge related to this service. The patient expressed understanding and agreed to proceed.  HPI  Reports she feels nausea and cough since Saturday. She denies fever, SOB. She also reports nasal congestion and headaches. She reports a runny nose. Denies diarrhea. Reports one episode of vomiting last night. Denies sick contacts.   Allergies  Allergen Reactions  . Augmentin [Amoxicillin-Pot Clavulanate] Itching  . Codeine Itching  . Codeine Sulfate Itching and Rash     Current Outpatient Medications:  .  albuterol (VENTOLIN HFA) 108 (90 Base) MCG/ACT inhaler, INHALE 2 PUFFS INTO LUNGS EVERY 6 HOURS AS NEEDED FOR WHEEZING OR SHORTNESS OF BREATH, Disp: 9 g, Rfl: 1 .  amLODipine (NORVASC) 5 MG tablet, Take 1 tablet by mouth once daily, Disp: 90 tablet, Rfl: 0 .  celecoxib (CELEBREX) 100 MG capsule, TAKE 1 CAPSULE BY MOUTH ONCE DAILY .PATIENT  NEEDS  LABS  DONE  FOR  FURTHER  REFILLS, Disp: 30 capsule, Rfl: 0 .  chlorzoxazone (PARAFON) 500 MG tablet, Take 1 tablet (500 mg total) by mouth every 6 (six) hours as needed for muscle spasms. TAKE 1 TABLET BY MOUTH TWICE DAILY AS NEEDED FOR MUSCLE SPASM, Disp: 360 tablet, Rfl: 0 .  diphenhydrAMINE (BENADRYL ALLERGY) 25 mg capsule, Take 25 mg by mouth every 6 (six) hours as  needed., Disp: , Rfl:  .  gabapentin (NEURONTIN) 800 MG tablet, Take 1 tablet (800 mg total) by mouth 3 (three) times daily., Disp: 270 tablet, Rfl: 1 .  omeprazole (PRILOSEC) 20 MG capsule, Take 1 capsule (20 mg total) by mouth daily., Disp: 90 capsule, Rfl: 3 .  rosuvastatin (CRESTOR) 10 MG tablet, Take 1 tablet (10 mg total) by mouth daily., Disp: 90 tablet, Rfl: 3 .  triamterene-hydrochlorothiazide (MAXZIDE-25) 37.5-25 MG tablet, Take 1 tablet by mouth once daily, Disp: 90 tablet, Rfl: 1 .  valACYclovir (VALTREX) 1000 MG tablet, Take 1 tablet (1,000 mg total) by mouth 2 (two) times daily., Disp: 20 tablet, Rfl: 5 .  varenicline (CHANTIX STARTING MONTH PAK) 0.5 MG X 11 & 1 MG X 42 tablet, Take one 0.5 mg tablet by mouth once daily for 3 days, then increase to one 0.5 mg tablet twice daily for 4 days, then increase to one 1 mg tablet twice daily., Disp: 53 tablet, Rfl: 0  Review of Systems  Social History   Tobacco Use  . Smoking status: Current Every Day Smoker    Packs/day: 0.25    Years: 45.00    Pack years: 11.25    Types: Cigarettes  . Smokeless tobacco: Never Used  . Tobacco comment: pt declines quitting at this time  Substance Use Topics  . Alcohol use: Yes    Alcohol/week: 14.0 - 21.0 standard drinks    Types: 14 - 21  Cans of beer per week      Objective:   There were no vitals taken for this visit. There were no vitals filed for this visit.There is no height or weight on file to calculate BMI.   Physical Exam   No results found for any visits on 01/25/20.     Assessment & Plan    1. Cough  Advised patient to get tested for COVID and on possible infectious etiologies. Patient reports she doesn't feel up to testing for COVID. Advised to assume she has it and quarantine. Quarantine ends on 01/30/2020. Return precautions counseled. Zofran and tessalon perles provided for symptomatic relief.   2. Other fatigue   I discussed the assessment and treatment plan with the  patient. The patient was provided an opportunity to ask questions and all were answered. The patient agreed with the plan and demonstrated an understanding of the instructions.   The patient was advised to call back or seek an in-person evaluation if the symptoms worsen or if the condition fails to improve as anticipated.  I provided 15 minutes of non-face-to-face time during this encounter.  The entirety of the information documented in the History of Present Illness, Review of Systems and Physical Exam were personally obtained by me. Portions of this information were initially documented by [facility] and reviewed by me for thoroughness and accuracy.      Trinna Post, PA-C  [facility]

## 2020-02-06 ENCOUNTER — Telehealth: Payer: Self-pay | Admitting: Physician Assistant

## 2020-02-06 NOTE — Telephone Encounter (Signed)
Left message for patient to schedule Annual Wellness Visit.  Please schedule with Nurse Health Advisor Victoria Britt, RN at [facility] [place] [facility]  

## 2020-02-11 ENCOUNTER — Other Ambulatory Visit: Payer: Self-pay | Admitting: Physician Assistant

## 2020-02-11 DIAGNOSIS — I10 Essential (primary) hypertension: Secondary | ICD-10-CM

## 2020-02-11 NOTE — Telephone Encounter (Signed)
Requested Prescriptions  Pending Prescriptions Disp Refills  . amLODipine (NORVASC) 5 MG tablet [Pharmacy Med Name: amLODIPine Besylate 5 MG Oral Tablet] 90 tablet 1    Sig: Take 1 tablet by mouth once daily     Cardiovascular:  Calcium Channel Blockers Passed - 02/11/2020 10:32 AM      Passed - Last BP in normal range    BP Readings from Last 1 Encounters:  12/27/19 130/88         Passed - Valid encounter within last 6 months    Recent Outpatient Visits          2 weeks ago Cough   [facility] [place], Adriana M, PA-C   1 month ago Chronic low back pain with sciatica, sciatica laterality unspecified, unspecified back pain laterality   [facility], [place], PA-C   4 months ago Essential (primary) hypertension   [facility], [place], PA-C   4 months ago Essential (primary) hypertension   [employer], [place], PA-C   5 months ago Urinary frequency   [facility] [place], Wendee Beavers, [place]      Future Appointments            In 4 months Terrilee Croak, Wendee Beavers, PA-C [employer], [place]

## 2020-02-27 ENCOUNTER — Ambulatory Visit: Payer: Medicare Other | Admitting: Student in an Organized Health Care Education/Training Program

## 2020-03-15 ENCOUNTER — Telehealth: Payer: Self-pay | Admitting: Physician Assistant

## 2020-03-15 NOTE — Telephone Encounter (Signed)
Left message for patient to schedule Annual Wellness Visit. Marland KitchenPlease schedule with Nurse Health Advisor [facility], [place].  Disregard message left 02/06/20

## 2020-05-07 ENCOUNTER — Ambulatory Visit: Payer: Self-pay | Admitting: *Deleted

## 2020-05-07 ENCOUNTER — Telehealth: Payer: Self-pay | Admitting: Family Medicine

## 2020-05-07 NOTE — Telephone Encounter (Addendum)
Patient called back to get an update on this message. She was added to Dr. Maralyn Sago schedule today at 2:40pm for a virtual visit. I spoke with patient to pre-chart for the virtual visit. Patient informed me that she has a severe headache since yesterday. She says that she knows her blood pressure is up because she only gets headaches when her blood pressure is really high. Patient states "it feels like my head is about to explode". She doesn't have a blood pressure monitor at home to check her blood pressure. Patient denies any blurred vision, chest pain or shortness of breath.  She has had some sinus congestion. I advised patient that she needs to go to Urgent care to have her blood pressure checked and headache evaluated. Patient was in agreement and plans to go to [facility] walk-in clinic near the ER right now.

## 2020-05-07 NOTE — Telephone Encounter (Signed)
Pt called with complaints of headache and blood shot eyes; she says this normally happens when her blood pressure is up; she states her headaches started 1700 on 05/06/20; the pt states her BP was elevated because her head only hurts when her BP is up; she says she took an extra amlodipine 5 mg; the pt also takes maxzide 37.5-25 mg daily; she has not taken any OTC meds for her headache, but she has been taking OTC allergy meds; the pt has not checked her BP "in a while"'; the pt states she thinks that she may have a sinus infection because "her head is congested"; recommendations made per nurse triage protocol; decision tree completed; spoke with Jiles Garter in regards to scheduling pt; the pt sees sees Carles Collet, but this provider has no availability within the timeframe per protocol; pt offered virtual appt with Dr Rosanna Randy, 05/08/20 at 1100; she verbalized understanding; will route to office for notification.  Reason for Disposition . [1] MODERATE headache (e.g., interferes with normal activities) AND [2] present > 24 hours AND [3] unexplained  (Exceptions: analgesics not tried, typical migraine, or headache part of viral illness)  Answer Assessment - Initial Assessment Questions 1. LOCATION: "Where does it hurt?"      "all over" 2. ONSET: "When did the headache start?" (Minutes, hours or days)      05/06/20 around 1700 3. PATTERN: "Does the pain come and go, or has it been constant since it started?"    constant 4. SEVERITY: "How bad is the pain?" and "What does it keep you from doing?"  (e.g., Scale 1-10; mild, moderate, or severe)   - MILD (1-3): doesn't interfere with normal activities    - MODERATE (4-7): interferes with normal activities or awakens from sleep    - SEVERE (8-10): excruciating pain, unable to do any normal activities       4-5 out of 10 now, 10 out of 10 last pm 5. RECURRENT SYMPTOM: "Have you ever had headaches before?" If Yes, ask: "When was the last time?" and "What happened  that time?"     yes, when BP is elevated 6. CAUSE: "What do you think is causing the headache?"   Increased BP or ? sinus infection 7. MIGRAINE: "Have you been diagnosed with migraine headaches?" If Yes, ask: "Is this headache similar?"     no 8. HEAD INJURY: "Has there been any recent injury to the head?"    no 9. OTHER SYMPTOMS: "Do you have any other symptoms?" (fever, stiff neck, eye pain, sore throat, cold symptoms)   "Feels like head is stopped up"; sinus pressure in temple area and around the sides of her eyes 10. PREGNANCY: "Is there any chance you are pregnant?" "When was your last menstrual period?"       no  Protocols used: HEADACHE-A-AH

## 2020-05-08 ENCOUNTER — Telehealth: Payer: Self-pay | Admitting: Family Medicine

## 2020-06-02 ENCOUNTER — Other Ambulatory Visit: Payer: Self-pay | Admitting: Physician Assistant

## 2020-06-02 DIAGNOSIS — M5440 Lumbago with sciatica, unspecified side: Secondary | ICD-10-CM

## 2020-06-02 NOTE — Telephone Encounter (Signed)
Requested medications are due for refill today?  Not on active medication list.  This medication refill cannot be delegated.    Requested medications are on active medication list?  No   Last Refill:  12/27/2019  # 360 with no refills.  Start Date 12/27/2019.  End Date 03/26/2020.  Prescription expired.    Future visit scheduled?  Yes  Notes to Clinic:  This medication refill cannot be delegated.

## 2020-06-06 ENCOUNTER — Other Ambulatory Visit: Payer: Self-pay | Admitting: Physician Assistant

## 2020-06-06 NOTE — Telephone Encounter (Signed)
She can call and schedule if they will accept her still. The referral is still active. Can provide the number.

## 2020-06-06 NOTE — Telephone Encounter (Signed)
Copied from [place] [id number]. Topic: Quick Communication - Rx Refill/Question >> Jun 06, 2020 12:56 PM Leward Quan A wrote: Medication: chlorzoxazone (PARAFON) 500 MG tablet  Per patient she is having issues with her back and her legs  Has the patient contacted their pharmacy? Yes.   (Agent: If no, request that the patient contact the pharmacy for the refill.) (Agent: If yes, when and what did the pharmacy advise?)  Preferred Pharmacy (with phone number or [place] name): [facility] [place]  Phone:  [phone] Fax:  [phone]     Agent: Please be advised that RX refills may take up to 3 business days. We ask that you follow-up with your pharmacy.

## 2020-06-06 NOTE — Telephone Encounter (Signed)
Tired calling patient and no answer, left voicemail message for patient to return call. If patient calls back okay for P[facility] to advise patient of message.

## 2020-06-06 NOTE — Telephone Encounter (Signed)
Phone call to pt. To discuss her symptoms.  Stated she has been taking Chlorzoxazone for years.  Reported her pain continues to be in the low back and mostly down left buttock region.  Stated "I can't sit too long and I can't stand too long, because it hurts."  Advised that she was referred to Pain Management in February.  The pt. Stated she never heard from anyone about that.  Noted that the referral note stated that "pt. was a no show 02/27/20.  The pt. stated she did not know about the appt.  Pt. Is asking for a refill of Chlorzoxazone to help her now, and to be referred again to Pain Management.  Will send note to Provider.  Agreed with plan.

## 2020-06-07 NOTE — Telephone Encounter (Signed)
Attempted to call pt.  Left vm to return call to office to receive message from [facility].

## 2020-06-11 NOTE — Telephone Encounter (Signed)
Called patient and no answer left voicemail message for patient to return call.

## 2020-06-12 NOTE — Telephone Encounter (Signed)
Message read to pt, verbalizes understanding. Please note pt requested to reschedule appt 06/25/20 to later time. Appt rescheduled for 1340.

## 2020-06-12 NOTE — Telephone Encounter (Signed)
See message from RN

## 2020-06-24 NOTE — Progress Notes (Deleted)
{This patient's chart is due for periodic physician review. Please check 'Cosign Required' and forward to your supervising physician.:1}  Established patient visit   Patient: Elizabeth Hogan   DOB: 1956/09/03   64 y.o. Female  MRN: [id number] Visit Date: 06/25/2020  Today's healthcare provider: Trinna Post, PA-C   No chief complaint on file.  Subjective    HPI  Hypertension, follow-up  BP Readings from Last 3 Encounters:  12/27/19 130/88  10/11/19 (!) 146/96  09/26/19 130/81   Wt Readings from Last 3 Encounters:  12/27/19 225 lb 3.2 oz (102.2 kg)  10/11/19 222 lb 3.2 oz (100.8 kg)  09/12/19 219 lb (99.3 kg)     She was last seen for hypertension 6 months ago.  BP at that visit was 130/88. Management since that visit includes no changes.  She reports {excellent/good/fair/poor:19665} compliance with treatment. She {is/is not:9024} having side effects. {document side effects if present:1} She is following a {diet:21022986} diet. She {is/is not:9024} exercising. She {does/does not:200015} smoke.  Use of agents associated with hypertension: {bp agents assoc with hypertension:511::"none"}.   Outside blood pressures are {***enter patient reported home BP readings, or 'not being checked':1}. Symptoms: {Yes/No:20286} chest pain {Yes/No:20286} chest pressure  {Yes/No:20286} palpitations {Yes/No:20286} syncope  {Yes/No:20286} dyspnea {Yes/No:20286} orthopnea  {Yes/No:20286} paroxysmal nocturnal dyspnea {Yes/No:20286} lower extremity edema   Pertinent labs: Lab Results  Component Value Date   CHOL 181 09/26/2019   HDL 56 09/26/2019   LDLCALC 102 (H) 09/26/2019   TRIG 128 09/26/2019   CHOLHDL 3.2 09/26/2019   Lab Results  Component Value Date   NA 137 10/11/2019   K 3.8 10/11/2019   CREATININE 0.98 10/11/2019   GFRNONAA 62 10/11/2019   GFRAA 71 10/11/2019   GLUCOSE 88 10/11/2019     The 10-year ASCVD risk score Mikey Bussing DC Jr., et al., 2013) is: 13.4%    Lipid/Cholesterol, Follow-up  Last lipid panel Other pertinent labs  Lab Results  Component Value Date   CHOL 181 09/26/2019   HDL 56 09/26/2019   LDLCALC 102 (H) 09/26/2019   TRIG 128 09/26/2019   CHOLHDL 3.2 09/26/2019   Lab Results  Component Value Date   ALT 16 10/11/2019   AST 20 10/11/2019   PLT 346 03/22/2019   TSH 9.450 (H) 03/22/2019     She was last seen for this 6 months ago.  Management since that visit includes no changes.  She reports {excellent/good/fair/poor:19665} compliance with treatment. She {is/is not:9024} having side effects. {document side effects if present:1}  Symptoms: {Yes/No:20286} chest pain {Yes/No:20286} chest pressure/discomfort  {Yes/No:20286} dyspnea {Yes/No:20286} lower extremity edema  {Yes/No:20286} numbness or tingling of extremity {Yes/No:20286} orthopnea  {Yes/No:20286} palpitations {Yes/No:20286} paroxysmal nocturnal dyspnea  {Yes/No:20286} speech difficulty {Yes/No:20286} syncope   Current diet: {diet habits:16563} Current exercise: {exercise types:16438}  The 10-year ASCVD risk score Mikey Bussing DC Jr., et al., 2013) is: 13.4%    {Show patient history (optional):23778::" "}   Medications: Outpatient Medications Prior to Visit  Medication Sig  . albuterol (VENTOLIN HFA) 108 (90 Base) MCG/ACT inhaler INHALE 2 PUFFS INTO LUNGS EVERY 6 HOURS AS NEEDED FOR WHEEZING OR SHORTNESS OF BREATH  . amLODipine (NORVASC) 5 MG tablet Take 1 tablet by mouth once daily  . celecoxib (CELEBREX) 100 MG capsule TAKE 1 CAPSULE BY MOUTH ONCE DAILY .PATIENT  NEEDS  LABS  DONE  FOR  FURTHER  REFILLS  . chlorzoxazone (PARAFON) 500 MG tablet TAKE 1 TABLET BY MOUTH EVERY 6 HOURS AS NEEDED FOR MUSCLE SPASM  .  diphenhydrAMINE (BENADRYL ALLERGY) 25 mg capsule Take 25 mg by mouth every 6 (six) hours as needed.  . gabapentin (NEURONTIN) 800 MG tablet Take 1 tablet (800 mg total) by mouth 3 (three) times daily.  Marland Kitchen omeprazole (PRILOSEC) 20 MG capsule Take 1  capsule (20 mg total) by mouth daily.  . ondansetron (ZOFRAN) 4 MG tablet Take 1 tablet (4 mg total) by mouth every 8 (eight) hours as needed for nausea or vomiting.  . rosuvastatin (CRESTOR) 10 MG tablet Take 1 tablet (10 mg total) by mouth daily.  Marland Kitchen triamterene-hydrochlorothiazide (MAXZIDE-25) 37.5-25 MG tablet Take 1 tablet by mouth once daily  . valACYclovir (VALTREX) 1000 MG tablet Take 1 tablet (1,000 mg total) by mouth 2 (two) times daily.  . varenicline (CHANTIX STARTING MONTH PAK) 0.5 MG X 11 & 1 MG X 42 tablet Take one 0.5 mg tablet by mouth once daily for 3 days, then increase to one 0.5 mg tablet twice daily for 4 days, then increase to one 1 mg tablet twice daily.   No facility-administered medications prior to visit.    Review of Systems  Constitutional: Negative.   Respiratory: Negative.   Cardiovascular: Negative.   Endocrine: Negative.   Musculoskeletal: Negative.   Neurological: Negative.     {Heme  Chem  Endocrine  Serology  Results Review (optional):23779::" "}  Objective    There were no vitals taken for this visit. {Show previous vital signs (optional):23777::" "}  Physical Exam  ***  No results found for any visits on 06/25/20.  Assessment & Plan     ***  No follow-ups on file.      {provider attestation***:1}   Paulene Floor  [facility] [phone] (phone) [phone] (fax)  [place]

## 2020-06-25 ENCOUNTER — Ambulatory Visit: Payer: Self-pay | Admitting: Physician Assistant

## 2020-06-25 NOTE — Progress Notes (Signed)
Subjective:   Elizabeth Hogan is a 64 y.o. female who presents for Medicare Annual (Subsequent) preventive examination.  I connected with [employer] today by telephone and verified that I am speaking with the correct person using two identifiers. Location patient: home Location provider: work Persons participating in the virtual visit: patient, provider.   I discussed the limitations, risks, security and privacy concerns of performing an evaluation and management service by telephone and the availability of in person appointments. I also discussed with the patient that there may be a patient responsible charge related to this service. The patient expressed understanding and verbally consented to this telephonic visit.    Interactive audio and video telecommunications were attempted between this provider and patient, however failed, due to patient having technical difficulties OR patient did not have access to video capability.  We continued and completed visit with audio only.   Review of Systems    N/A  Cardiac Risk Factors include: advanced age (>29men, >45 women);dyslipidemia;smoking/ tobacco exposure;sedentary lifestyle;hypertension     Objective:    There were no vitals filed for this visit. There is no height or weight on file to calculate BMI.  Advanced Directives 06/26/2020 06/07/2019 05/12/2019 11/12/2017 05/27/2017 03/16/2016 10/09/2015  Does Patient Have a Medical Advance Directive? No No No No No No No  Would patient like information on creating a medical advance directive? No - Patient declined Yes (MAU/Ambulatory/Procedural Areas - Information given) Yes (MAU/Ambulatory/Procedural Areas - Information given) No - Patient declined - - Yes - Educational materials given    Current Medications (verified) Outpatient Encounter Medications as of 06/26/2020  Medication Sig  . albuterol (VENTOLIN HFA) 108 (90 Base) MCG/ACT inhaler INHALE 2 PUFFS INTO LUNGS EVERY 6 HOURS AS NEEDED FOR  WHEEZING OR SHORTNESS OF BREATH  . amLODipine (NORVASC) 5 MG tablet Take 1 tablet by mouth once daily  . chlorzoxazone (PARAFON) 500 MG tablet TAKE 1 TABLET BY MOUTH EVERY 6 HOURS AS NEEDED FOR MUSCLE SPASM  . doxylamine, Sleep, (SLEEP AID) 25 MG tablet Take 25 mg by mouth at bedtime as needed. Unsure what type of OTC sleep aid  . gabapentin (NEURONTIN) 800 MG tablet Take 1 tablet (800 mg total) by mouth 3 (three) times daily.  Marland Kitchen loratadine (ALLERGY) 10 MG tablet Take 10 mg by mouth daily. Unsure which allergy medication is comparable to  . naproxen sodium (ALEVE) 220 MG tablet Take 220 mg by mouth daily as needed.  Marland Kitchen omeprazole (PRILOSEC) 20 MG capsule Take 1 capsule (20 mg total) by mouth daily.  . rosuvastatin (CRESTOR) 10 MG tablet Take 1 tablet (10 mg total) by mouth daily.  Marland Kitchen triamterene-hydrochlorothiazide (MAXZIDE-25) 37.5-25 MG tablet Take 1 tablet by mouth once daily  . celecoxib (CELEBREX) 100 MG capsule TAKE 1 CAPSULE BY MOUTH ONCE DAILY .PATIENT  NEEDS  LABS  DONE  FOR  FURTHER  REFILLS (Patient not taking: Reported on 06/26/2020)  . diphenhydrAMINE (BENADRYL ALLERGY) 25 mg capsule Take 25 mg by mouth every 6 (six) hours as needed. (Patient not taking: Reported on 06/26/2020)  . ondansetron (ZOFRAN) 4 MG tablet Take 1 tablet (4 mg total) by mouth every 8 (eight) hours as needed for nausea or vomiting. (Patient not taking: Reported on 06/26/2020)  . valACYclovir (VALTREX) 1000 MG tablet Take 1 tablet (1,000 mg total) by mouth 2 (two) times daily. (Patient not taking: Reported on 06/26/2020)  . varenicline (CHANTIX STARTING MONTH PAK) 0.5 MG X 11 & 1 MG X 42 tablet Take one  0.5 mg tablet by mouth once daily for 3 days, then increase to one 0.5 mg tablet twice daily for 4 days, then increase to one 1 mg tablet twice daily. (Patient not taking: Reported on 06/26/2020)   No facility-administered encounter medications on file as of 06/26/2020.    Allergies (verified) Augmentin [amoxicillin-pot  clavulanate], Codeine, and Codeine sulfate   History: Past Medical History:  Diagnosis Date  . Anxiety   . GERD (gastroesophageal reflux disease)   . Hyperlipidemia   . Hypertension   . Seizures ([place])    in the 1970's.  none since.  . Wears dentures    partial upper (loose)   Past Surgical History:  Procedure Laterality Date  . ABDOMINAL HYSTERECTOMY  2005   still has cervix  . CATARACT EXTRACTION W/PHACO Left 05/12/2019   Procedure: CATARACT EXTRACTION PHACO AND INTRAOCULAR LENS PLACEMENT ([place]) COMPLICATED  LEFT HEALON 5, VISION BLUE, SYMFONY LENS;  Surgeon: Leandrew Koyanagi, MD;  Location: [facility];  Service: Ophthalmology;  Laterality: Left;  . CATARACT EXTRACTION W/PHACO Right 06/07/2019   Procedure: CATARACT EXTRACTION PHACO AND INTRAOCULAR LENS PLACEMENT ([place])  COMPLICATED RIGHT;  Surgeon: Leandrew Koyanagi, MD;  Location: [facility];  Service: Ophthalmology;  Laterality: Right;  MALYUGIN  . COLONOSCOPY WITH PROPOFOL N/A 02/11/2016   Procedure: COLONOSCOPY WITH PROPOFOL;  Surgeon: Lucilla Lame, MD;  Location: [facility];  Service: Endoscopy;  Laterality: N/A;  . LAMINECTOMY  07/2010   Secondary to Job injury in 2010, Subsequent nerve damage and followed by [facility]  . TOOTH EXTRACTION    . TUBAL LIGATION     Family History  Problem Relation Age of Onset  . Hypertension Mother   . Arthritis Mother   . Gout Mother   . Diabetes Father   . Alzheimer's disease Father   . Hypertension Father   . Hypertension Brother   . Breast cancer Cousin 40       maternal side   Social History   Socioeconomic History  . Marital status: Single    Spouse name: Not on file  . Number of children: 3  . Years of education: Not on file  . Highest education level: GED or equivalent  Occupational History  . Occupation: disability  Tobacco Use  . Smoking status: Current Every Day Smoker    Packs/day: 0.25    Years: 45.00    Pack years: 11.25    Types:  Cigarettes  . Smokeless tobacco: Never Used  . Tobacco comment: pt declines quitting at this time  Vaping Use  . Vaping Use: Never used  Substance and Sexual Activity  . Alcohol use: Yes    Alcohol/week: 14.0 - 21.0 standard drinks    Types: 14 - 21 Cans of beer per week    Comment: 2-3 beers a night  . Drug use: Yes    Types: Cocaine    Comment: current use.  Marland Kitchen Sexual activity: Yes    Birth control/protection: None    Comment: 1 pack lasts pt 3-4 days  Other Topics Concern  . Not on file  Social History Narrative   Patients son is deceased. Currently has 1 son and 1 daughter living.    Social Determinants of Health   Financial Resource Strain: Low Risk   . Difficulty of Paying Living Expenses: Not hard at all  Food Insecurity: No Food Insecurity  . Worried About Charity fundraiser in the Last Year: Never true  . Ran Out of Food in the Last  Year: Never true  Transportation Needs: No Transportation Needs  . Lack of Transportation (Medical): No  . Lack of Transportation (Non-Medical): No  Physical Activity: Inactive  . Days of Exercise per Week: 0 days  . Minutes of Exercise per Session: 0 min  Stress: No Stress Concern Present  . Feeling of Stress : Not at all  Social Connections: Socially Isolated  . Frequency of Communication with Friends and Family: More than three times a week  . Frequency of Social Gatherings with Friends and Family: Never  . Attends Religious Services: Never  . Active Member of Clubs or Organizations: No  . Attends Archivist Meetings: Never  . Marital Status: Never married    Tobacco Counseling Ready to quit: Yes Counseling given: No Comment: pt declines quitting at this time   Clinical Intake:  Pre-visit preparation completed: Yes  Pain : No/denies pain     Nutritional Risks: None Diabetes: No  How often do you need to have someone help you when you read instructions, pamphlets, or other written materials from your  doctor or pharmacy?: 1 - Never  Diabetic? No  Interpreter Needed?: No  Information entered by :: [facility], LPN   Activities of Daily Living In your present state of health, do you have any difficulty performing the following activities: 06/26/2020  Hearing? N  Vision? N  Difficulty concentrating or making decisions? N  Walking or climbing stairs? N  Dressing or bathing? N  Doing errands, shopping? N  Preparing Food and eating ? N  Using the Toilet? N  In the past six months, have you accidently leaked urine? N  Do you have problems with loss of bowel control? N  Managing your Medications? N  Managing your Finances? N  Housekeeping or managing your Housekeeping? N  Some recent data might be hidden    Patient Care Team: Paulene Floor as PCP - General (Physician Assistant) Bary Castilla [place], MD (General Surgery) [place], [facility])  Indicate any recent Medical Services you may have received from other than [facility] providers in the past year (date may be approximate).     Assessment:   This is a routine wellness examination for Raliyah.  Hearing/Vision screen No exam data present  Dietary issues and exercise activities discussed: Current Exercise Habits: The patient does not participate in regular exercise at present, Exercise limited by: None identified  Goals    . Quit Smoking     Recommend to remove any items from the home that may cause slips or trips.    . Reduce alcohol intake     Recommend to cut back on alcohol consumption by half and not exceeding more than 1 drink a day.       Depression Screen PHQ 2/9 Scores 06/26/2020 12/27/2019 11/16/2018 11/12/2017 05/27/2017 12/16/2015 09/02/2015  PHQ - 2 Score 0 0 1 3 0 5 5  PHQ- 9 Score - - 4 3 2 10 9     Fall Risk Fall Risk  06/26/2020 11/16/2018 11/12/2017 05/27/2017 12/16/2015  Falls in the past year? 0 0 No No No  Number falls in past yr: 0 - - - -  Injury with Fall? 0 - - - -    Any stairs in or  around the home? Yes  If so, are there any without handrails? No  Home free of loose throw rugs in walkways, pet beds, electrical cords, etc? Yes  Adequate lighting in your home to reduce risk of falls? Yes  ASSISTIVE DEVICES UTILIZED TO PREVENT FALLS:  Life alert? No  Use of a cane, walker or w/c? No  Grab bars in the bathroom? No  Shower chair or bench in shower? No  Elevated toilet seat or a handicapped toilet? No    Cognitive Function:        Immunizations There is no immunization history for the selected administration types on file for this patient.  TDAP status: Due, Education has been provided regarding the importance of this vaccine. Advised may receive this vaccine at local pharmacy or Health Dept. Aware to provide a copy of the vaccination record if obtained from local pharmacy or Health Dept. Verbalized acceptance and understanding. Flu Vaccine status: Due fall 2021 Covid-19 vaccine status: Declined, Education has been provided regarding the importance of this vaccine but patient still declined. Advised may receive this vaccine at local pharmacy or Health Dept.or vaccine clinic. Aware to provide a copy of the vaccination record if obtained from local pharmacy or Health Dept. Verbalized acceptance and understanding.  Qualifies for Shingles Vaccine? Yes   Zostavax completed No   Shingrix Completed?: No.    Education has been provided regarding the importance of this vaccine. Patient has been advised to call insurance company to determine out of pocket expense if they have not yet received this vaccine. Advised may also receive vaccine at local pharmacy or Health Dept. Verbalized acceptance and understanding.  Screening Tests Health Maintenance  Topic Date Due  . INFLUENZA VACCINE  06/09/2020  . COVID-19 Vaccine (1) 07/12/2020 (Originally 12/23/1967)  . TETANUS/TDAP  06/26/2021 (Originally [birth year]/1975)  . PAP SMEAR-Modifier  11/16/2021  . MAMMOGRAM  01/15/2022  .  COLONOSCOPY  02/10/2026  . Hepatitis C Screening  Completed  . HIV Screening  Completed    Health Maintenance  Health Maintenance Due  Topic Date Due  . INFLUENZA VACCINE  06/09/2020    Colorectal cancer screening: Completed 02/11/16. Repeat every 10 years Mammogram status: Completed 01/16/20. Repeat every year  Lung Cancer Screening: (Low Dose CT Chest recommended if Age 62-80 years, 30 pack-year currently smoking OR have quit w/in 15years.) does qualify.   Lung Cancer Screening Referral: An Epic message has been sent to Burgess Estelle, RN (Oncology Nurse Navigator) regarding the possible need for this exam. Raquel Sarna will review the patient's chart to determine if the patient truly qualifies for the exam. If the patient qualifies, Raquel Sarna will order the Low Dose CT of the chest to facilitate the scheduling of this exam.  Additional Screening:  Hepatitis C Screening: Up to date  Vision Screening: Recommended annual ophthalmology exams for early detection of glaucoma and other disorders of the eye. Is the patient up to date with their annual eye exam?  Yes  Who is the provider or what is the name of the office in which the patient attends annual eye exams? [facility] If pt is not established with a provider, would they like to be referred to a provider to establish care? No .   Dental Screening: Recommended annual dental exams for proper oral hygiene  Community Resource Referral / Chronic Care Management: CRR required this visit?  No   CCM required this visit?  No      Plan:     I have personally reviewed and noted the following in the patient's chart:   . Medical and social history . Use of alcohol, tobacco or illicit drugs  . Current medications and supplements . Functional ability and status . Nutritional status . Physical  activity . Advanced directives . List of other physicians . Hospitalizations, surgeries, and ER visits in previous 12  months . Vitals . Screenings to include cognitive, depression, and falls . Referrals and appointments  In addition, I have reviewed and discussed with patient certain preventive protocols, quality metrics, and best practice recommendations. A written personalized care plan for preventive services as well as general preventive health recommendations were provided to patient.     Lennox Leikam [place], [place]   06/18/7043   Nurse Notes: Declined receiving a future Covid vaccine.

## 2020-06-26 ENCOUNTER — Ambulatory Visit (INDEPENDENT_AMBULATORY_CARE_PROVIDER_SITE_OTHER): Payer: Medicare Other

## 2020-06-26 ENCOUNTER — Other Ambulatory Visit: Payer: Self-pay

## 2020-06-26 DIAGNOSIS — Z0000 Encounter for general adult medical examination without abnormal findings: Secondary | ICD-10-CM | POA: Diagnosis not present

## 2020-06-26 NOTE — Patient Instructions (Signed)
Ms. Elizabeth Hogan , Thank you for taking time to come for your Medicare Wellness Visit. I appreciate your ongoing commitment to your health goals. Please review the following plan we discussed and let me know if I can assist you in the future.   Screening recommendations/referrals: Colonoscopy: Up to date, due 02/2026 Mammogram: Up to date, due 01/2021 Recommended yearly ophthalmology/optometry visit for glaucoma screening and checkup Recommended yearly dental visit for hygiene and checkup  Vaccinations: Influenza vaccine: Due fall 2021 Tdap vaccine: Currently due, declined at this time. Shingles vaccine: Shingrix discussed. Please contact your pharmacy for coverage information.     Advanced directives: Advance directive discussed with you today. Even though you declined this today please call our office should you change your mind and we can give you the proper paperwork for you to fill out.  Conditions/risks identified: Smoking cessation; Recommend to cut back on alcohol consumption by half and not exceeding more than 1 drink a day. Also recommend exercising 3 days a week for at least 30 minutes at a time.   Next appointment: 07/04/20 @ 2:00 PM with [name], Female Preventive care refers to lifestyle choices and visits with your health care provider that can promote health and wellness. What does preventive care include?  A yearly physical exam. This is also called an annual well check.  Dental exams once or twice a year.  Routine eye exams. Ask your health care provider how often you should have your eyes checked.  Personal lifestyle choices, including:  Daily care of your teeth and gums.  Regular physical activity.  Eating a healthy diet.  Avoiding tobacco and drug use.  Limiting alcohol use.  Practicing safe sex.  Taking low-dose aspirin daily starting at age 49.  Taking vitamin and mineral supplements as recommended by your health care  provider. What happens during an annual well check? The services and screenings done by your health care provider during your annual well check will depend on your age, overall health, lifestyle risk factors, and family history of disease. Counseling  Your health care provider may ask you questions about your:  Alcohol use.  Tobacco use.  Drug use.  Emotional well-being.  Home and relationship well-being.  Sexual activity.  Eating habits.  Work and work Statistician.  Method of birth control.  Menstrual cycle.  Pregnancy history. Screening  You may have the following tests or measurements:  Height, weight, and BMI.  Blood pressure.  Lipid and cholesterol levels. These may be checked every 5 years, or more frequently if you are over 64 years old.  Skin check.  Lung cancer screening. You may have this screening every year starting at age 51 if you have a 30-pack-year history of smoking and currently smoke or have quit within the past 15 years.  Fecal occult blood test (FOBT) of the stool. You may have this test every year starting at age 17.  Flexible sigmoidoscopy or colonoscopy. You may have a sigmoidoscopy every 5 years or a colonoscopy every 10 years starting at age 13.  Hepatitis C blood test.  Hepatitis B blood test.  Sexually transmitted disease (STD) testing.  Diabetes screening. This is done by checking your blood sugar (glucose) after you have not eaten for a while (fasting). You may have this done every 1-3 years.  Mammogram. This may be done every 1-2 years. Talk to your health care provider about when you should start having regular mammograms. This may depend on whether you  have a family history of breast cancer.  BRCA-related cancer screening. This may be done if you have a family history of breast, ovarian, tubal, or peritoneal cancers.  Pelvic exam and Pap test. This may be done every 3 years starting at age 23. Starting at age 64, this may be  done every 5 years if you have a Pap test in combination with an HPV test.  Bone density scan. This is done to screen for osteoporosis. You may have this scan if you are at high risk for osteoporosis. Discuss your test results, treatment options, and if necessary, the need for more tests with your health care provider. Vaccines  Your health care provider may recommend certain vaccines, such as:  Influenza vaccine. This is recommended every year.  Tetanus, diphtheria, and acellular pertussis (Tdap, Td) vaccine. You may need a Td booster every 10 years.  Zoster vaccine. You may need this after age 51.  Pneumococcal 13-valent conjugate (PCV13) vaccine. You may need this if you have certain conditions and were not previously vaccinated.  Pneumococcal polysaccharide (PPSV23) vaccine. You may need one or two doses if you smoke cigarettes or if you have certain conditions. Talk to your health care provider about which screenings and vaccines you need and how often you need them. This information is not intended to replace advice given to you by your health care provider. Make sure you discuss any questions you have with your health care provider. Document Released: 11/22/2015 Document Revised: 07/15/2016 Document Reviewed: 08/27/2015 Elsevier Interactive Patient Education  2017 [place] Prevention in the Home Falls can cause injuries. They can happen to people of all ages. There are many things you can do to make your home safe and to help prevent falls. What can I do on the outside of my home?  Regularly fix the edges of walkways and driveways and fix any cracks.  Remove anything that might make you trip as you walk through a door, such as a raised step or threshold.  Trim any bushes or trees on the path to your home.  Use bright outdoor lighting.  Clear any walking paths of anything that might make someone trip, such as rocks or tools.  Regularly check to see if  handrails are loose or broken. Make sure that both sides of any steps have handrails.  Any raised decks and porches should have guardrails on the edges.  Have any leaves, snow, or ice cleared regularly.  Use sand or salt on walking paths during winter.  Clean up any spills in your garage right away. This includes oil or grease spills. What can I do in the bathroom?  Use night lights.  Install grab bars by the toilet and in the tub and shower. Do not use towel bars as grab bars.  Use non-skid mats or decals in the tub or shower.  If you need to sit down in the shower, use a plastic, non-slip stool.  Keep the floor dry. Clean up any water that spills on the floor as soon as it happens.  Remove soap buildup in the tub or shower regularly.  Attach bath mats securely with double-sided non-slip rug tape.  Do not have throw rugs and other things on the floor that can make you trip. What can I do in the bedroom?  Use night lights.  Make sure that you have a light by your bed that is easy to reach.  Do not use any sheets or blankets  that are too big for your bed. They should not hang down onto the floor.  Have a firm chair that has side arms. You can use this for support while you get dressed.  Do not have throw rugs and other things on the floor that can make you trip. What can I do in the kitchen?  Clean up any spills right away.  Avoid walking on wet floors.  Keep items that you use a lot in easy-to-reach places.  If you need to reach something above you, use a strong step stool that has a grab bar.  Keep electrical cords out of the way.  Do not use floor polish or wax that makes floors slippery. If you must use wax, use non-skid floor wax.  Do not have throw rugs and other things on the floor that can make you trip. What can I do with my stairs?  Do not leave any items on the stairs.  Make sure that there are handrails on both sides of the stairs and use them. Fix  handrails that are broken or loose. Make sure that handrails are as long as the stairways.  Check any carpeting to make sure that it is firmly attached to the stairs. Fix any carpet that is loose or worn.  Avoid having throw rugs at the top or bottom of the stairs. If you do have throw rugs, attach them to the floor with carpet tape.  Make sure that you have a light switch at the top of the stairs and the bottom of the stairs. If you do not have them, ask someone to add them for you. What else can I do to help prevent falls?  Wear shoes that:  Do not have high heels.  Have rubber bottoms.  Are comfortable and fit you well.  Are closed at the toe. Do not wear sandals.  If you use a stepladder:  Make sure that it is fully opened. Do not climb a closed stepladder.  Make sure that both sides of the stepladder are locked into place.  Ask someone to hold it for you, if possible.  Clearly mark and make sure that you can see:  Any grab bars or handrails.  First and last steps.  Where the edge of each step is.  Use tools that help you move around (mobility aids) if they are needed. These include:  Canes.  Walkers.  Scooters.  Crutches.  Turn on the lights when you go into a dark area. Replace any light bulbs as soon as they burn out.  Set up your furniture so you have a clear path. Avoid moving your furniture around.  If any of your floors are uneven, fix them.  If there are any pets around you, be aware of where they are.  Review your medicines with your doctor. Some medicines can make you feel dizzy. This can increase your chance of falling. Ask your doctor what other things that you can do to help prevent falls. This information is not intended to replace advice given to you by your health care provider. Make sure you discuss any questions you have with your health care provider. Document Released: 08/22/2009 Document Revised: 04/02/2016 Document Reviewed:  11/30/2014 Elsevier Interactive Patient Education  2017 [employer].

## 2020-07-01 ENCOUNTER — Encounter: Payer: Medicare Other | Admitting: Physician Assistant

## 2020-07-07 ENCOUNTER — Other Ambulatory Visit: Payer: Self-pay | Admitting: Physician Assistant

## 2020-07-07 DIAGNOSIS — G6289 Other specified polyneuropathies: Secondary | ICD-10-CM

## 2020-07-09 ENCOUNTER — Encounter: Payer: Medicare Other | Admitting: Physician Assistant

## 2020-07-16 ENCOUNTER — Encounter: Payer: Medicare Other | Admitting: Physician Assistant

## 2020-07-17 ENCOUNTER — Encounter: Payer: Self-pay | Admitting: Physician Assistant

## 2020-07-17 ENCOUNTER — Ambulatory Visit (INDEPENDENT_AMBULATORY_CARE_PROVIDER_SITE_OTHER): Payer: Medicare Other | Admitting: Physician Assistant

## 2020-07-17 ENCOUNTER — Ambulatory Visit
Admission: RE | Admit: 2020-07-17 | Discharge: 2020-07-17 | Disposition: A | Discharge status: Home / Self Care | Payer: Medicare Other | Attending: Physician Assistant | Admitting: Physician Assistant

## 2020-07-17 ENCOUNTER — Ambulatory Visit
Admission: RE | Admit: 2020-07-17 | Discharge: 2020-07-17 | Disposition: A | Discharge status: Home / Self Care | Payer: Medicare Other | Source: Ambulatory Visit | Attending: Physician Assistant | Admitting: Physician Assistant

## 2020-07-17 ENCOUNTER — Other Ambulatory Visit: Payer: Self-pay

## 2020-07-17 VITALS — BP 133/81 | HR 80 | Temp 98.2°F | Ht 70.0 in

## 2020-07-17 DIAGNOSIS — M25559 Pain in unspecified hip: Secondary | ICD-10-CM

## 2020-07-17 DIAGNOSIS — F17200 Nicotine dependence, unspecified, uncomplicated: Secondary | ICD-10-CM | POA: Diagnosis not present

## 2020-07-17 DIAGNOSIS — Z0000 Encounter for general adult medical examination without abnormal findings: Secondary | ICD-10-CM | POA: Diagnosis not present

## 2020-07-17 DIAGNOSIS — E785 Hyperlipidemia, unspecified: Secondary | ICD-10-CM

## 2020-07-17 DIAGNOSIS — I10 Essential (primary) hypertension: Secondary | ICD-10-CM | POA: Diagnosis not present

## 2020-07-17 MED ORDER — CHANTIX STARTING MONTH PAK 0.5 MG X 11 & 1 MG X 42 PO TABS: ORAL_TABLET | ORAL | 0 refills | Status: AC

## 2020-07-17 NOTE — Progress Notes (Signed)
Complete physical exam   Patient: Elizabeth Hogan   DOB: 09/30/1956   64 y.o. Female  MRN: [id number] Visit Date: 07/17/2020  Today's healthcare provider: Trinna Post, PA-C   Chief Complaint  Patient presents with  . Annual Exam   Subjective    Elizabeth Hogan is a 64 y.o. female who presents today for a complete physical exam.  She reports consuming a general diet. The patient does not participate in regular exercise at present. She generally feels fairly well. She reports sleeping poorly. She does not have additional problems to discuss today.   She is UTD on maintenance exams. Declines flu shot and declines COVID shots. She continue to abstain from cocaine.   Lipid/Cholesterol, Follow-up  Last lipid panel Other pertinent labs  Lab Results  Component Value Date   CHOL 171 07/17/2020   HDL 67 07/17/2020   LDLCALC 78 07/17/2020   TRIG 153 (H) 07/17/2020   CHOLHDL 2.6 07/17/2020   Lab Results  Component Value Date   ALT 18 07/17/2020   AST 23 07/17/2020   PLT 252 07/17/2020   TSH 2.480 07/17/2020     She was last seen for this 6 months ago.  Management since that visit includes continue statin  She reports excellent compliance with treatment. She is not having side effects.   Symptoms: No chest pain No chest pressure/discomfort  No dyspnea No lower extremity edema  No numbness or tingling of extremity No orthopnea  No palpitations No paroxysmal nocturnal dyspnea  No speech difficulty No syncope   Current diet: not asked Current exercise: none  The 10-year ASCVD risk score Mikey Bussing DC Jr., et al., 2013) is: 14%  --------------------------------------------------------------------------------------------------- Hypertension, follow-up  BP Readings from Last 3 Encounters:  07/17/20 133/81  12/27/19 130/88  10/11/19 (!) 146/96   Wt Readings from Last 3 Encounters:  12/27/19 225 lb 3.2 oz (102.2 kg)  10/11/19 222 lb 3.2 oz (100.8 kg)  09/12/19 219 lb  (99.3 kg)     She was last seen for hypertension 6 months ago.  BP at that visit was well controlled. Management since that visit includes continue amlodipine and maxzide.  She reports excellent compliance with treatment. She is not having side effects.  She is following a Regular diet. She is not exercising. She does not smoke.  Use of agents associated with hypertension: none.   Outside blood pressures are not checked. Symptoms: No chest pain No chest pressure  No palpitations No syncope  No dyspnea No orthopnea  No paroxysmal nocturnal dyspnea No lower extremity edema   Pertinent labs: Lab Results  Component Value Date   CHOL 171 07/17/2020   HDL 67 07/17/2020   LDLCALC 78 07/17/2020   TRIG 153 (H) 07/17/2020   CHOLHDL 2.6 07/17/2020   Lab Results  Component Value Date   NA 143 07/17/2020   K 3.3 (L) 07/17/2020   CREATININE 0.85 07/17/2020   GFRNONAA 73 07/17/2020   GFRAA 84 07/17/2020   GLUCOSE 75 07/17/2020     The 10-year ASCVD risk score Mikey Bussing DC Jr., et al., 2013) is: 14%   ---------------------------------------------------------------------------------------------------  Low Back Pain  She is treated for chronic low back pain with gabapentin and parafon. She had previously been followed by neurology in [place]. Last visit, she reported her back pain had increased significantly. She was referred to [facility] pain management. She did not show up to her initial appointment. Today she reports that she didn't go because her  back pain improved and it must have been a flare. She does however reports some bilateral hip pain, particularly when going up stairs, as well as aching.   Tobacco Abuse  Patient continues to smoke 1/2 pack per day. Tried chantix last year but stopped chantix. Now concerned that the drug has been recalled. Is no longer doing cocaine and has been almost 2 years without doing so.   Past Medical History:  Diagnosis Date  . Anxiety   . GERD  (gastroesophageal reflux disease)   . Hyperlipidemia   . Hypertension   . Seizures ([place])    in the 1970's.  none since.  . Wears dentures    partial upper (loose)   Past Surgical History:  Procedure Laterality Date  . ABDOMINAL HYSTERECTOMY  2005   still has cervix  . CATARACT EXTRACTION W/PHACO Left 05/12/2019   Procedure: CATARACT EXTRACTION PHACO AND INTRAOCULAR LENS PLACEMENT ([place]) COMPLICATED  LEFT HEALON 5, VISION BLUE, SYMFONY LENS;  Surgeon: Leandrew Koyanagi, MD;  Location: [place];  Service: Ophthalmology;  Laterality: Left;  . CATARACT EXTRACTION W/PHACO Right 06/07/2019   Procedure: CATARACT EXTRACTION PHACO AND INTRAOCULAR LENS PLACEMENT ([place])  COMPLICATED RIGHT;  Surgeon: Leandrew Koyanagi, MD;  Location: [place];  Service: Ophthalmology;  Laterality: Right;  MALYUGIN  . COLONOSCOPY WITH PROPOFOL N/A 02/11/2016   Procedure: COLONOSCOPY WITH PROPOFOL;  Surgeon: Lucilla Lame, MD;  Location: [facility];  Service: Endoscopy;  Laterality: N/A;  . LAMINECTOMY  07/2010   Secondary to Job injury in 2010, Subsequent nerve damage and followed by [facility]  . TOOTH EXTRACTION    . TUBAL LIGATION     Social History   Socioeconomic History  . Marital status: Single    Spouse name: Not on file  . Number of children: 3  . Years of education: Not on file  . Highest education level: GED or equivalent  Occupational History  . Occupation: disability  Tobacco Use  . Smoking status: Current Every Day Smoker    Packs/day: 0.25    Years: 45.00    Pack years: 11.25    Types: Cigarettes  . Smokeless tobacco: Never Used  . Tobacco comment: pt declines quitting at this time  Vaping Use  . Vaping Use: Never used  Substance and Sexual Activity  . Alcohol use: Yes    Alcohol/week: 14.0 - 21.0 standard drinks    Types: 14 - 21 Cans of beer per week    Comment: 2-3 beers a night  . Drug use: Yes    Types: Cocaine    Comment: current use.  Marland Kitchen Sexual  activity: Yes    Birth control/protection: None    Comment: 1 pack lasts pt 3-4 days  Other Topics Concern  . Not on file  Social History Narrative   Patients son is deceased. Currently has 1 son and 1 daughter living.    Social Determinants of Health   Financial Resource Strain: Low Risk   . Difficulty of Paying Living Expenses: Not hard at all  Food Insecurity: No Food Insecurity  . Worried About Charity fundraiser in the Last Year: Never true  . Ran Out of Food in the Last Year: Never true  Transportation Needs: No Transportation Needs  . Lack of Transportation (Medical): No  . Lack of Transportation (Non-Medical): No  Physical Activity: Inactive  . Days of Exercise per Week: 0 days  . Minutes of Exercise per Session: 0 min  Stress: No Stress Concern Present  .  Feeling of Stress : Not at all  Social Connections: Socially Isolated  . Frequency of Communication with Friends and Family: More than three times a week  . Frequency of Social Gatherings with Friends and Family: Never  . Attends Religious Services: Never  . Active Member of Clubs or Organizations: No  . Attends Archivist Meetings: Never  . Marital Status: Never married  Intimate Partner Violence: Not At Risk  . Fear of Current or Ex-Partner: No  . Emotionally Abused: No  . Physically Abused: No  . Sexually Abused: No   Family Status  Relation Name Status  . Mother  Alive  . Father  Deceased at age 28's  . Brother  Deceased at age 29's       cause of death was kidney failure with attempted transplant  . Sister  Deceased at age 22       Cancer-11/2018  . Brother  Alive  . Cousin  (Not Specified)   Family History  Problem Relation Age of Onset  . Hypertension Mother   . Arthritis Mother   . Gout Mother   . Diabetes Father   . Alzheimer's disease Father   . Hypertension Father   . Hypertension Brother   . Breast cancer Cousin 40       maternal side   Allergies  Allergen Reactions  .  Augmentin [Amoxicillin-Pot Clavulanate] Itching  . Codeine Itching  . Codeine Sulfate Itching and Rash    Patient Care Team: Paulene Floor as PCP - General (Physician Assistant) Robert Bellow, MD (General Surgery) Pa, [facility])   Medications: Outpatient Medications Prior to Visit  Medication Sig  . albuterol (VENTOLIN HFA) 108 (90 Base) MCG/ACT inhaler INHALE 2 PUFFS INTO LUNGS EVERY 6 HOURS AS NEEDED FOR WHEEZING OR SHORTNESS OF BREATH  . amLODipine (NORVASC) 5 MG tablet Take 1 tablet by mouth once daily  . chlorzoxazone (PARAFON) 500 MG tablet TAKE 1 TABLET BY MOUTH EVERY 6 HOURS AS NEEDED FOR MUSCLE SPASM  . doxylamine, Sleep, (SLEEP AID) 25 MG tablet Take 25 mg by mouth at bedtime as needed. Unsure what type of OTC sleep aid  . gabapentin (NEURONTIN) 800 MG tablet TAKE 1 TABLET BY MOUTH THREE TIMES DAILY  . loratadine (ALLERGY) 10 MG tablet Take 10 mg by mouth daily. Unsure which allergy medication is comparable to  . naproxen sodium (ALEVE) 220 MG tablet Take 220 mg by mouth daily as needed.  Marland Kitchen omeprazole (PRILOSEC) 20 MG capsule Take 1 capsule (20 mg total) by mouth daily.  . rosuvastatin (CRESTOR) 10 MG tablet Take 1 tablet (10 mg total) by mouth daily.  Marland Kitchen triamterene-hydrochlorothiazide (MAXZIDE-25) 37.5-25 MG tablet Take 1 tablet by mouth once daily  . celecoxib (CELEBREX) 100 MG capsule TAKE 1 CAPSULE BY MOUTH ONCE DAILY .PATIENT  NEEDS  LABS  DONE  FOR  FURTHER  REFILLS (Patient not taking: Reported on 06/26/2020)  . diphenhydrAMINE (BENADRYL ALLERGY) 25 mg capsule Take 25 mg by mouth every 6 (six) hours as needed. (Patient not taking: Reported on 06/26/2020)  . ondansetron (ZOFRAN) 4 MG tablet Take 1 tablet (4 mg total) by mouth every 8 (eight) hours as needed for nausea or vomiting. (Patient not taking: Reported on 06/26/2020)  . valACYclovir (VALTREX) 1000 MG tablet Take 1 tablet (1,000 mg total) by mouth 2 (two) times daily. (Patient not taking:  Reported on 06/26/2020)  . [DISCONTINUED] varenicline (CHANTIX STARTING MONTH PAK) 0.5 MG X 11 & 1  MG X 42 tablet Take one 0.5 mg tablet by mouth once daily for 3 days, then increase to one 0.5 mg tablet twice daily for 4 days, then increase to one 1 mg tablet twice daily. (Patient not taking: Reported on 06/26/2020)   No facility-administered medications prior to visit.    Review of Systems  Constitutional: Negative.   HENT: Negative.   Eyes: Negative.   Respiratory: Negative.   Cardiovascular: Negative.   Gastrointestinal: Negative.   Endocrine: Negative.   Genitourinary: Negative.   Musculoskeletal: Negative.   Skin: Negative.   Allergic/Immunologic: Negative.   Neurological: Negative.   Hematological: Negative.   Psychiatric/Behavioral: Negative.        Objective    BP 133/81   Pulse 80   Temp 98.2 F (36.8 C)   Ht 5\' 10"  (1.778 m)   BMI 32.31 kg/m    Physical Exam Constitutional:      Appearance: Normal appearance.  HENT:     Right Ear: Tympanic membrane and ear canal normal.     Left Ear: Tympanic membrane and ear canal normal.  Cardiovascular:     Rate and Rhythm: Normal rate and regular rhythm.     Heart sounds: Normal heart sounds.  Pulmonary:     Effort: Pulmonary effort is normal.     Breath sounds: Normal breath sounds.  Abdominal:     General: Bowel sounds are normal.     Palpations: Abdomen is soft.  Musculoskeletal:     Cervical back: Normal range of motion and neck supple.  Lymphadenopathy:     Cervical: No cervical adenopathy.  Skin:    General: Skin is warm and dry.  Neurological:     Mental Status: She is alert and oriented to person, place, and time. Mental status is at baseline.  Psychiatric:        Mood and Affect: Mood normal.        Behavior: Behavior normal.       Last depression screening scores PHQ 2/9 Scores 06/26/2020 12/27/2019 11/16/2018  PHQ - 2 Score 0 0 1  PHQ- 9 Score - - 4   Last fall risk screening Fall Risk   06/26/2020  Falls in the past year? 0  Number falls in past yr: 0  Injury with Fall? 0   Last Audit-C alcohol use screening Alcohol Use Disorder Test (AUDIT) 06/26/2020  1. How often do you have a drink containing alcohol? 4  2. How many drinks containing alcohol do you have on a typical day when you are drinking? 1  3. How often do you have six or more drinks on one occasion? 0  AUDIT-C Score 5  4. How often during the last year have you found that you were not able to stop drinking once you had started? 0  5. How often during the last year have you failed to do what was normally expected from you because of drinking? 0  6. How often during the last year have you needed a first drink in the morning to get yourself going after a heavy drinking session? 0  7. How often during the last year have you had a feeling of guilt of remorse after drinking? 0  8. How often during the last year have you been unable to remember what happened the night before because you had been drinking? 0  9. Have you or someone else been injured as a result of your drinking? 0  10. Has a relative or friend or  a doctor or another health worker been concerned about your drinking or suggested you cut down? 0  Alcohol Use Disorder Identification Test Final Score (AUDIT) 5  Alcohol Brief Interventions/Follow-up AUDIT Score <7 follow-up not indicated;Brief Advice;Continued Monitoring   A score of 3 or more in women, and 4 or more in men indicates increased risk for alcohol abuse, EXCEPT if all of the points are from question 1   Results for orders placed or performed in visit on 07/17/20  TSH  Result Value Ref Range   TSH 2.480 0.450 - 4.500 uIU/mL  Lipid panel  Result Value Ref Range   Cholesterol, Total 171 100 - 199 mg/dL   Triglycerides 153 (H) 0 - 149 mg/dL   HDL 67 >39 mg/dL   VLDL Cholesterol Cal 26 5 - 40 mg/dL   LDL Chol Calc (NIH) 78 0 - 99 mg/dL   Chol/HDL Ratio 2.6 0.0 - 4.4 ratio  Comprehensive  metabolic panel  Result Value Ref Range   Glucose 75 65 - 99 mg/dL   BUN 12 8 - 27 mg/dL   Creatinine, Ser 0.85 0.57 - 1.00 mg/dL   GFR calc non Af Amer 73 >59 mL/min/1.73   GFR calc Af Amer 84 >59 mL/min/1.73   BUN/Creatinine Ratio 14 12 - 28   Sodium 143 134 - 144 mmol/L   Potassium 3.3 (L) 3.5 - 5.2 mmol/L   Chloride 103 96 - 106 mmol/L   CO2 25 20 - 29 mmol/L   Calcium 9.4 8.7 - 10.3 mg/dL   Total Protein 7.1 6.0 - 8.5 g/dL   Albumin 4.6 3.8 - 4.8 g/dL   Globulin, Total 2.5 1.5 - 4.5 g/dL   Albumin/Globulin Ratio 1.8 1.2 - 2.2   Bilirubin Total 0.6 0.0 - 1.2 mg/dL   Alkaline Phosphatase 81 48 - 121 IU/L   AST 23 0 - 40 IU/L   ALT 18 0 - 32 IU/L  CBC with Differential/Platelet  Result Value Ref Range   WBC 5.8 3.4 - 10.8 x10E3/uL   RBC 4.34 3.77 - 5.28 x10E6/uL   Hemoglobin 13.2 11.1 - 15.9 g/dL   Hematocrit 39.9 34.0 - 46.6 %   MCV 92 79 - 97 fL   MCH 30.4 26.6 - 33.0 pg   MCHC 33.1 31 - 35 g/dL   RDW 13.5 11.7 - 15.4 %   Platelets 252 150 - 450 x10E3/uL   Neutrophils 50 Not Estab. %   Lymphs 40 Not Estab. %   Monocytes 7 Not Estab. %   Eos 2 Not Estab. %   Basos 1 Not Estab. %   Neutrophils Absolute 2.9 1 - 7 x10E3/uL   Lymphocytes Absolute 2.3 0 - 3 x10E3/uL   Monocytes Absolute 0.4 0 - 0 x10E3/uL   EOS (ABSOLUTE) 0.1 0.0 - 0.4 x10E3/uL   Basophils Absolute 0.0 0 - 0 x10E3/uL   Immature Granulocytes 0 Not Estab. %   Immature Grans (Abs) 0.0 0.0 - 0.1 x10E3/uL    Assessment & Plan    Routine Health Maintenance and Physical Exam  Exercise Activities and Dietary recommendations Goals    . Quit Smoking     Recommend to remove any items from the home that may cause slips or trips.    . Reduce alcohol intake     Recommend to cut back on alcohol consumption by half and not exceeding more than 1 drink a day.        There is no immunization history for the selected  administration types on file for this patient.  Health Maintenance  Topic Date Due  .  COVID-19 Vaccine (1) 08/02/2020 (Originally 12/23/1967)  . INFLUENZA VACCINE  02/06/2021 (Originally 06/09/2020)  . TETANUS/TDAP  06/26/2021 (Originally 12/24/1975)  . PAP SMEAR-Modifier  11/16/2021  . MAMMOGRAM  01/15/2022  . COLONOSCOPY  02/10/2026  . Hepatitis C Screening  Completed  . HIV Screening  Completed    Discussed health benefits of physical activity, and encouraged her to engage in regular exercise appropriate for her age and condition.  1. Annual physical exam  - TSH - Lipid panel - Comprehensive metabolic panel - CBC with Differential/Platelet  2. Hyperlipidemia, unspecified hyperlipidemia type  Previously well controlled Continue statin Repeat FLP and CMP Goal LDL < 100   - TSH - Lipid panel - Comprehensive metabolic panel - CBC with Differential/Platelet  3. Tobacco use disorder  I have counseled > 3 minutes about risks/benefits of quitting smoking. Explained certain batches of chantix were recalled, not the entire drug.   - varenicline (CHANTIX STARTING MONTH PAK) 0.5 MG X 11 & 1 MG X 42 tablet; Take one 0.5 mg tablet daily for 3 days, then take one 0.5 mg tablet twice daily for 4 days, then take one 1 mg tablet twice daily  Dispense: 53 tablet; Refill: 0  4. Essential (primary) hypertension  Well controlled Continue current medications Recheck metabolic panel F/u in 6 months  - TSH - Lipid panel - Comprehensive metabolic panel - CBC with Differential/Platelet  5. Hip pain  - DG Hip Unilat W OR W/O Pelvis 2-3 Views Left; Future - DG Hip Unilat W OR W/O Pelvis 2-3 Views Right; Future    Return in about 6 months (around 01/14/2021) for HTN and hLD.     ITrinna Post, PA-C, have reviewed all documentation for this visit. The documentation on 07/23/20 for the exam, diagnosis, procedures, and orders are all accurate and complete.    Paulene Floor  [facility] [phone] (phone) [phone] (fax)  [place]

## 2020-07-17 NOTE — Patient Instructions (Signed)
Health Maintenance, Female Adopting a healthy lifestyle and getting preventive care are important in promoting health and wellness. Ask your health care provider about:  The right schedule for you to have regular tests and exams.  Things you can do on your own to prevent diseases and keep yourself healthy. What should I know about diet, weight, and exercise? Eat a healthy diet   Eat a diet that includes plenty of vegetables, fruits, low-fat dairy products, and lean protein.  Do not eat a lot of foods that are high in solid fats, added sugars, or sodium. Maintain a healthy weight Body mass index (BMI) is used to identify weight problems. It estimates body fat based on height and weight. Your health care provider can help determine your BMI and help you achieve or maintain a healthy weight. Get regular exercise Get regular exercise. This is one of the most important things you can do for your health. Most adults should:  Exercise for at least 150 minutes each week. The exercise should increase your heart rate and make you sweat (moderate-intensity exercise).  Do strengthening exercises at least twice a week. This is in addition to the moderate-intensity exercise.  Spend less time sitting. Even light physical activity can be beneficial. Watch cholesterol and blood lipids Have your blood tested for lipids and cholesterol at 63 years of age, then have this test every 5 years. Have your cholesterol levels checked more often if:  Your lipid or cholesterol levels are high.  You are older than 63 years of age.  You are at high risk for heart disease. What should I know about cancer screening? Depending on your health history and family history, you may need to have cancer screening at various ages. This may include screening for:  Breast cancer.  Cervical cancer.  Colorectal cancer.  Skin cancer.  Lung cancer. What should I know about heart disease, diabetes, and high blood  pressure? Blood pressure and heart disease  High blood pressure causes heart disease and increases the risk of stroke. This is more likely to develop in people who have high blood pressure readings, are of African descent, or are overweight.  Have your blood pressure checked: ? Every 3-5 years if you are 18-39 years of age. ? Every year if you are 40 years old or older. Diabetes Have regular diabetes screenings. This checks your fasting blood sugar level. Have the screening done:  Once every three years after age 40 if you are at a normal weight and have a low risk for diabetes.  More often and at a younger age if you are overweight or have a high risk for diabetes. What should I know about preventing infection? Hepatitis B If you have a higher risk for hepatitis B, you should be screened for this virus. Talk with your health care provider to find out if you are at risk for hepatitis B infection. Hepatitis C Testing is recommended for:  Everyone born from 1945 through 1965.  Anyone with known risk factors for hepatitis C. Sexually transmitted infections (STIs)  Get screened for STIs, including gonorrhea and chlamydia, if: ? You are sexually active and are younger than 63 years of age. ? You are older than 63 years of age and your health care provider tells you that you are at risk for this type of infection. ? Your sexual activity has changed since you were last screened, and you are at increased risk for chlamydia or gonorrhea. Ask your health care provider if   you are at risk.  Ask your health care provider about whether you are at high risk for HIV. Your health care provider may recommend a prescription medicine to help prevent HIV infection. If you choose to take medicine to prevent HIV, you should first get tested for HIV. You should then be tested every 3 months for as long as you are taking the medicine. Pregnancy  If you are about to stop having your period (premenopausal) and  you may become pregnant, seek counseling before you get pregnant.  Take 400 to 800 micrograms (mcg) of folic acid every day if you become pregnant.  Ask for birth control (contraception) if you want to prevent pregnancy. Osteoporosis and menopause Osteoporosis is a disease in which the bones lose minerals and strength with aging. This can result in bone fractures. If you are 65 years old or older, or if you are at risk for osteoporosis and fractures, ask your health care provider if you should:  Be screened for bone loss.  Take a calcium or vitamin D supplement to lower your risk of fractures.  Be given hormone replacement therapy (HRT) to treat symptoms of menopause. Follow these instructions at home: Lifestyle  Do not use any products that contain nicotine or tobacco, such as cigarettes, e-cigarettes, and chewing tobacco. If you need help quitting, ask your health care provider.  Do not use [place] drugs.  Do not share needles.  Ask your health care provider for help if you need support or information about quitting drugs. Alcohol use  Do not drink alcohol if: ? Your health care provider tells you not to drink. ? You are pregnant, may be pregnant, or are planning to become pregnant.  If you drink alcohol: ? Limit how much you use to 0-1 drink a day. ? Limit intake if you are breastfeeding.  Be aware of how much alcohol is in your drink. In the [place] one drink equals one 12 oz bottle of beer (355 mL), one 5 oz glass of wine (148 mL), or one 1 oz glass of hard liquor (44 mL). General instructions  Schedule regular health, dental, and eye exams.  Stay current with your vaccines.  Tell your health care provider if: ? You often feel depressed. ? You have ever been abused or do not feel safe at home. Summary  Adopting a healthy lifestyle and getting preventive care are important in promoting health and wellness.  Follow your health care provider's instructions about healthy  diet, exercising, and getting tested or screened for diseases.  Follow your health care provider's instructions on monitoring your cholesterol and blood pressure. This information is not intended to replace advice given to you by your health care provider. Make sure you discuss any questions you have with your health care provider. Document Revised: 10/19/2018 Document Reviewed: 10/19/2018 Elsevier Patient Education  2020 [employer]vier Inc[employer]  

## 2020-07-18 ENCOUNTER — Telehealth: Payer: Self-pay

## 2020-07-18 ENCOUNTER — Ambulatory Visit: Payer: Self-pay | Admitting: Physician Assistant

## 2020-07-18 LAB — COMPREHENSIVE METABOLIC PANEL
ALT: 18 IU/L (ref 0–32)
AST: 23 IU/L (ref 0–40)
Albumin/Globulin Ratio: 1.8 (ref 1.2–2.2)
Albumin: 4.6 g/dL (ref 3.8–4.8)
Alkaline Phosphatase: 81 IU/L (ref 48–121)
BUN/Creatinine Ratio: 14 (ref 12–28)
BUN: 12 mg/dL (ref 8–27)
Bilirubin Total: 0.6 mg/dL (ref 0.0–1.2)
CO2: 25 mmol/L (ref 20–29)
Calcium: 9.4 mg/dL (ref 8.7–10.3)
Chloride: 103 mmol/L (ref 96–106)
Creatinine, Ser: 0.85 mg/dL (ref 0.57–1.00)
GFR calc Af Amer: 84 mL/min/{1.73_m2} (ref >59)
GFR calc non Af Amer: 73 mL/min/{1.73_m2} (ref >59)
Globulin, Total: 2.5 g/dL (ref 1.5–4.5)
Glucose: 75 mg/dL (ref 65–99)
Potassium: 3.3 mmol/L — ABNORMAL LOW (ref 3.5–5.2)
Sodium: 143 mmol/L (ref 134–144)
Total Protein: 7.1 g/dL (ref 6.0–8.5)

## 2020-07-18 LAB — CBC WITH DIFFERENTIAL/PLATELET
Basophils Absolute: 0 10*3/uL (ref 0.0–0.2)
Basos: 1 %
EOS (ABSOLUTE): 0.1 10*3/uL (ref 0.0–0.4)
Eos: 2 %
Hematocrit: 39.9 % (ref 34.0–46.6)
Hemoglobin: 13.2 g/dL (ref 11.1–15.9)
Immature Grans (Abs): 0 10*3/uL (ref 0.0–0.1)
Immature Granulocytes: 0 %
Lymphocytes Absolute: 2.3 10*3/uL (ref 0.7–3.1)
Lymphs: 40 %
MCH: 30.4 pg (ref 26.6–33.0)
MCHC: 33.1 g/dL (ref 31.5–35.7)
MCV: 92 fL (ref 79–97)
Monocytes Absolute: 0.4 10*3/uL (ref 0.1–0.9)
Monocytes: 7 %
Neutrophils Absolute: 2.9 10*3/uL (ref 1.4–7.0)
Neutrophils: 50 %
Platelets: 252 10*3/uL (ref 150–450)
RBC: 4.34 x10E6/uL (ref 3.77–5.28)
RDW: 13.5 % (ref 11.7–15.4)
WBC: 5.8 10*3/uL (ref 3.4–10.8)

## 2020-07-18 LAB — LIPID PANEL
Chol/HDL Ratio: 2.6 ratio (ref 0.0–4.4)
Cholesterol, Total: 171 mg/dL (ref 100–199)
HDL: 67 mg/dL (ref >39)
LDL Chol Calc (NIH): 78 mg/dL (ref 0–99)
Triglycerides: 153 mg/dL — ABNORMAL HIGH (ref 0–149)
VLDL Cholesterol Cal: 26 mg/dL (ref 5–40)

## 2020-07-18 LAB — TSH: TSH: 2.48 u[IU]/mL (ref 0.450–4.500)

## 2020-07-18 NOTE — Telephone Encounter (Signed)
Patient was advised and states she will reach out to the pain clinic.

## 2020-07-18 NOTE — Telephone Encounter (Signed)
Patient notified of lab result and PCP comment- she did ask about her X-ray results- advised provider has reviewed and office nurse will be calling her regarding those results. (not released to [facility] at this time)

## 2020-07-18 NOTE — Progress Notes (Signed)
Patient was advised via another message. 

## 2020-07-18 NOTE — Telephone Encounter (Signed)
Pt returning call for lab results. Please advise.   Call to patient- notified of lab results- see PC note. Patient asked about X-ray result- advised PCP has reviewed and she can expect call from office nurse soon. (results not released to [facility] at this time)  Reason for Disposition . Caller requesting lab results  Protocols used: PCP CALL - NO TRIAGE-A-AH

## 2020-07-18 NOTE — Telephone Encounter (Signed)
Left message to call back. Ok for PEC to advise pt. 

## 2020-07-18 NOTE — Telephone Encounter (Signed)
-----   Message from Trinna Post, [place] sent at 07/18/2020 10:38 AM EDT ----- Labs are stable, we'll see her in follow up.

## 2020-07-18 NOTE — Telephone Encounter (Signed)
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

## 2020-07-18 NOTE — Telephone Encounter (Signed)
Patient was advised of her x-ray result via another message.

## 2020-08-02 ENCOUNTER — Telehealth: Payer: Self-pay | Admitting: *Deleted

## 2020-08-02 ENCOUNTER — Other Ambulatory Visit: Payer: Self-pay | Admitting: Physician Assistant

## 2020-08-02 DIAGNOSIS — Z87891 Personal history of nicotine dependence: Secondary | ICD-10-CM

## 2020-08-02 DIAGNOSIS — Z122 Encounter for screening for malignant neoplasm of respiratory organs: Secondary | ICD-10-CM

## 2020-08-02 DIAGNOSIS — I10 Essential (primary) hypertension: Secondary | ICD-10-CM

## 2020-08-02 NOTE — Telephone Encounter (Signed)
Received referral for initial lung cancer screening scan. Contacted patient and obtained smoking history,(current, 24 pack year) as well as answering questions related to screening process. Patient denies signs of lung cancer such as weight loss or hemoptysis. Patient denies comorbidity that would prevent curative treatment if lung cancer were found. Patient is scheduled for shared decision making visit and CT scan on 08/27/20 at 2pm.

## 2020-08-12 ENCOUNTER — Other Ambulatory Visit: Payer: Medicare Other

## 2020-08-12 DIAGNOSIS — Z20822 Contact with and (suspected) exposure to covid-19: Secondary | ICD-10-CM

## 2020-08-13 LAB — SARS-COV-2, NAA 2 DAY TAT

## 2020-08-13 LAB — NOVEL CORONAVIRUS, NAA: SARS-CoV-2, NAA: NOT DETECTED

## 2020-08-13 NOTE — Telephone Encounter (Signed)
error 

## 2020-08-14 ENCOUNTER — Telehealth: Payer: Self-pay

## 2020-08-14 NOTE — Telephone Encounter (Signed)
Patient called to get COVID results. Made her aware they were negative. 

## 2020-08-22 ENCOUNTER — Other Ambulatory Visit: Payer: Self-pay | Admitting: Physician Assistant

## 2020-08-22 DIAGNOSIS — E785 Hyperlipidemia, unspecified: Secondary | ICD-10-CM

## 2020-08-23 ENCOUNTER — Other Ambulatory Visit: Payer: Self-pay | Admitting: Physician Assistant

## 2020-08-23 DIAGNOSIS — I10 Essential (primary) hypertension: Secondary | ICD-10-CM

## 2020-08-26 ENCOUNTER — Other Ambulatory Visit: Payer: Self-pay | Admitting: Physician Assistant

## 2020-08-26 DIAGNOSIS — E785 Hyperlipidemia, unspecified: Secondary | ICD-10-CM

## 2020-08-27 ENCOUNTER — Ambulatory Visit: Payer: Medicare Other | Attending: Nurse Practitioner

## 2020-08-27 ENCOUNTER — Encounter: Payer: Self-pay | Admitting: Nurse Practitioner

## 2020-08-27 ENCOUNTER — Other Ambulatory Visit: Payer: Self-pay

## 2020-08-27 ENCOUNTER — Inpatient Hospital Stay: Payer: Medicare Other | Attending: Nurse Practitioner | Admitting: Nurse Practitioner

## 2020-08-27 DIAGNOSIS — E785 Hyperlipidemia, unspecified: Secondary | ICD-10-CM

## 2020-08-27 MED ORDER — ROSUVASTATIN CALCIUM 10 MG PO TABS: 10.0000 mg | ORAL_TABLET | Freq: Every day | ORAL | 0 refills | Status: DC

## 2020-09-12 ENCOUNTER — Telehealth: Payer: Self-pay

## 2020-09-12 NOTE — Telephone Encounter (Signed)
Contacted patient for lung CT screening program to reschedule missed CT scan appt from 08/27/20.  Patient states she has changed her mind about participating in program and doesn't want the scan.  I thanked her for her time.  She has not been rescheduled.

## 2020-11-06 ENCOUNTER — Other Ambulatory Visit: Payer: Self-pay | Admitting: Physician Assistant

## 2020-11-06 DIAGNOSIS — I10 Essential (primary) hypertension: Secondary | ICD-10-CM

## 2020-11-25 ENCOUNTER — Other Ambulatory Visit: Payer: Self-pay | Admitting: Physician Assistant

## 2020-11-25 DIAGNOSIS — M5440 Lumbago with sciatica, unspecified side: Secondary | ICD-10-CM

## 2020-11-25 DIAGNOSIS — E785 Hyperlipidemia, unspecified: Secondary | ICD-10-CM

## 2020-11-25 DIAGNOSIS — G8929 Other chronic pain: Secondary | ICD-10-CM

## 2020-11-25 NOTE — Telephone Encounter (Signed)
Requested medication (s) are due for refill today: Yes  Requested medication (s) are on the active medication list: Yes  Last refill:  06/10/20  Future visit scheduled: No  Notes to clinic: Unable to refill per protocol, cannot delegate.      Requested Prescriptions  Pending Prescriptions Disp Refills   chlorzoxazone (PARAFON) 500 MG tablet [Pharmacy Med Name: CHLORZOXAZON 500MG   TAB] 360 tablet 0    Sig: TAKE 1 TABLET BY MOUTH EVERY 6 HOURS AS NEEDED FOR MUSCLE SPASM      Not Delegated - Analgesics:  Muscle Relaxants Failed - 11/25/2020  8:19 PM      Failed - This refill cannot be delegated      Passed - Valid encounter within last 6 months    Recent Outpatient Visits           4 months ago Annual physical exam   [facility] Terrilee Croak, Adriana M, PA-C   10 months ago Cough   [facility] Carles Collet M, PA-C   11 months ago Chronic low back pain with sciatica, sciatica laterality unspecified, unspecified back pain laterality   [facility] [place], Adriana M, PA-C   1 year ago Essential (primary) hypertension   [employer], [place], PA-C   1 year ago Essential (primary) hypertension   [employer], Adriana M, [place]                 Signed Prescriptions Disp Refills   rosuvastatin (CRESTOR) 10 MG tablet 90 tablet 2    Sig: Take 1 tablet by mouth once daily      Cardiovascular:  Antilipid - Statins Failed - 11/25/2020  8:19 PM      Failed - LDL in normal range and within 360 days    LDL Chol Calc (NIH)  Date Value Ref Range Status  07/17/2020 78 0 - 99 mg/dL Final          Failed - Triglycerides in normal range and within 360 days    Triglycerides  Date Value Ref Range Status  07/17/2020 153 (H) 0 - 149 mg/dL Final          Passed - Total Cholesterol in normal range and within 360 days    Cholesterol, Total  Date Value Ref Range Status  07/17/2020 171 100 - 199 mg/dL  Final          Passed - HDL in normal range and within 360 days    HDL  Date Value Ref Range Status  07/17/2020 67 >39 mg/dL Final          Passed - Patient is not pregnant      Passed - Valid encounter within last 12 months    Recent Outpatient Visits           4 months ago Annual physical exam   [employer], Adriana M, PA-C   10 months ago Cough   [facility] Carles Collet M, PA-C   11 months ago Chronic low back pain with sciatica, sciatica laterality unspecified, unspecified back pain laterality   [facility], Wendee Beavers, PA-C   1 year ago Essential (primary) hypertension   [place], [place], PA-C   1 year ago Essential (primary) hypertension   [place], [place], [place]

## 2020-12-22 ENCOUNTER — Other Ambulatory Visit: Payer: Self-pay | Admitting: Physician Assistant

## 2020-12-22 DIAGNOSIS — G6289 Other specified polyneuropathies: Secondary | ICD-10-CM

## 2021-01-16 ENCOUNTER — Other Ambulatory Visit: Payer: Self-pay | Admitting: Physician Assistant

## 2021-01-16 DIAGNOSIS — K219 Gastro-esophageal reflux disease without esophagitis: Secondary | ICD-10-CM

## 2021-02-03 ENCOUNTER — Other Ambulatory Visit: Payer: Self-pay | Admitting: Physician Assistant

## 2021-02-03 DIAGNOSIS — I10 Essential (primary) hypertension: Secondary | ICD-10-CM

## 2021-02-03 NOTE — Telephone Encounter (Signed)
   Notes to clinic:  Patient due for a follow up appointment on 01/14/2021 Review for courtesy refill  Message left for patient  Requested Prescriptions  Pending Prescriptions Disp Refills   triamterene-hydrochlorothiazide (MAXZIDE-25) 37.5-25 MG tablet [Pharmacy Med Name: Triamterene-HCTZ 37.5-25 MG Oral Tablet] 90 tablet 0    Sig: Take 1 tablet by mouth once daily      Cardiovascular: Diuretic Combos Failed - 02/03/2021  2:56 PM      Failed - K in normal range and within 360 days    Potassium  Date Value Ref Range Status  07/17/2020 3.3 (L) 3.5 - 5.2 mmol/L Final          Failed - Valid encounter within last 6 months    Recent Outpatient Visits           6 months ago Annual physical exam   [facility] Carles Collet M, [place]   1 year ago Cough   [facility] Carles Collet M, [place]   1 year ago Chronic low back pain with sciatica, sciatica laterality unspecified, unspecified back pain laterality   [facility], Adriana M, PA-C   1 year ago Essential (primary) hypertension   [place], [place], PA-C   1 year ago Essential (primary) hypertension   [employer], Adriana M, PA-C                Passed - Na in normal range and within 360 days    Sodium  Date Value Ref Range Status  07/17/2020 143 134 - 144 mmol/L Final          Passed - Cr in normal range and within 360 days    Creatinine, Ser  Date Value Ref Range Status  07/17/2020 0.85 0.57 - 1.00 mg/dL Final          Passed - Ca in normal range and within 360 days    Calcium  Date Value Ref Range Status  07/17/2020 9.4 8.7 - 10.3 mg/dL Final          Passed - Last BP in normal range    BP Readings from Last 1 Encounters:  07/17/20 133/81

## 2021-02-13 ENCOUNTER — Other Ambulatory Visit: Payer: Self-pay

## 2021-02-13 ENCOUNTER — Telehealth: Payer: Self-pay | Admitting: Physician Assistant

## 2021-02-13 DIAGNOSIS — Z1231 Encounter for screening mammogram for malignant neoplasm of breast: Secondary | ICD-10-CM

## 2021-02-13 NOTE — Telephone Encounter (Signed)
Pt was advised from [facility] that she needs an order sent for her yearly mammogram / please advise if pt needs to be seen in office for this or if referral/orders can be placed

## 2021-03-12 ENCOUNTER — Telehealth: Payer: Self-pay

## 2021-03-12 DIAGNOSIS — I10 Essential (primary) hypertension: Secondary | ICD-10-CM

## 2021-03-12 MED ORDER — AMLODIPINE BESYLATE 5 MG PO TABS: 1.0000 | ORAL_TABLET | Freq: Every day | ORAL | 1 refills | Status: DC

## 2021-03-12 NOTE — Telephone Encounter (Signed)
[facility] faxed refill request for the following medications:   amLODipine (NORVASC) 5 MG tablet   Please advise.  

## 2021-04-28 ENCOUNTER — Ambulatory Visit (INDEPENDENT_AMBULATORY_CARE_PROVIDER_SITE_OTHER): Payer: Medicare Other | Admitting: Family Medicine

## 2021-04-28 ENCOUNTER — Other Ambulatory Visit: Payer: Self-pay

## 2021-04-28 ENCOUNTER — Encounter: Payer: Self-pay | Admitting: Family Medicine

## 2021-04-28 DIAGNOSIS — K219 Gastro-esophageal reflux disease without esophagitis: Secondary | ICD-10-CM

## 2021-04-28 DIAGNOSIS — G6289 Other specified polyneuropathies: Secondary | ICD-10-CM

## 2021-04-28 DIAGNOSIS — I10 Essential (primary) hypertension: Secondary | ICD-10-CM | POA: Diagnosis not present

## 2021-04-28 DIAGNOSIS — E785 Hyperlipidemia, unspecified: Secondary | ICD-10-CM

## 2021-04-28 NOTE — Patient Instructions (Addendum)
STOP MAXZIDE-25. STOP SMOKING

## 2021-04-28 NOTE — Progress Notes (Addendum)
I,[name],acting as a scribe for Elizabeth Durie, MD.,have documented all relevant documentation on the behalf of Elizabeth Durie, MD,as directed by  Elizabeth Durie, MD while in the presence of Elizabeth Durie, MD.   Established patient visit   Patient: Elizabeth Hogan   DOB: 15-Oct-1956   65 y.o. Female  MRN: [id number] Visit Date: 04/28/2021  Today's healthcare provider: [name], MD   Chief Complaint  Patient presents with   Follow-up   Hypertension   Hyperlipidemia   Subjective    HPI  Patient comes in today for follow-up.  Overall she is very pleased with her efforts to lose weight with dietary measures.  She feels great about this. She also states in the past she used illicit drugs and has been able to stop this behavior also.  I praised her for all of these changes that she is made Hypertension, follow-up  BP Readings from Last 3 Encounters:  04/28/21 104/71  07/17/20 133/81  12/27/19 130/88   Wt Readings from Last 3 Encounters:  04/28/21 196 lb (88.9 kg)  12/27/19 225 lb 3.2 oz (102.2 kg)  10/11/19 222 lb 3.2 oz (100.8 kg)     She was last seen for hypertension 9 months ago by Carles Collet, PA.  BP at that visit was 133/81. Management since that visit includes no medication changes.  She reports good compliance with treatment. She is not having side effects. NONE3 She is following a Regular diet. She is not exercising. She does smoke.  Use of agents associated with hypertension: none.   Outside blood pressures are not checking.  Pertinent labs: Lab Results  Component Value Date   CHOL 171 07/17/2020   HDL 67 07/17/2020   LDLCALC 78 07/17/2020   TRIG 153 (H) 07/17/2020   CHOLHDL 2.6 07/17/2020   Lab Results  Component Value Date   NA 143 07/17/2020   K 3.3 (L) 07/17/2020   CREATININE 0.85 07/17/2020   GFRNONAA 73 07/17/2020   GFRAA 84 07/17/2020   GLUCOSE 75 07/17/2020     The 10-year ASCVD risk score Mikey Bussing DC  Jr., et al., 2013) is: 8.3%   Lipid/Cholesterol, Follow-up  Last lipid panel Other pertinent labs  Lab Results  Component Value Date   CHOL 171 07/17/2020   HDL 67 07/17/2020   LDLCALC 78 07/17/2020   TRIG 153 (H) 07/17/2020   CHOLHDL 2.6 07/17/2020   Lab Results  Component Value Date   ALT 18 07/17/2020   AST 23 07/17/2020   PLT 252 07/17/2020   TSH 2.480 07/17/2020     She was last seen for this 9 months ago.  Management since that visit includes no medication changes.  She reports good compliance with treatment. She is not having side effects. none  Current diet: in general, a "healthy" diet   Current exercise: none  The 10-year ASCVD risk score Mikey Bussing DC Jr., et al., 2013) is: 8.3%     Medications: Outpatient Medications Prior to Visit  Medication Sig   amLODipine (NORVASC) 5 MG tablet Take 1 tablet (5 mg total) by mouth daily.   chlorzoxazone (PARAFON) 500 MG tablet TAKE 1 TABLET BY MOUTH EVERY 6 HOURS AS NEEDED FOR MUSCLE SPASM   doxylamine, Sleep, (SLEEP AID) 25 MG tablet Take 25 mg by mouth at bedtime as needed. Unsure what type of OTC sleep aid   gabapentin (NEURONTIN) 800 MG tablet TAKE 1 TABLET BY MOUTH THREE TIMES DAILY  loratadine (CLARITIN) 10 MG tablet Take 10 mg by mouth daily. Unsure which allergy medication is comparable to   naproxen sodium (ALEVE) 220 MG tablet Take 220 mg by mouth daily as needed.   omeprazole (PRILOSEC) 20 MG capsule Take 1 capsule by mouth once daily   rosuvastatin (CRESTOR) 10 MG tablet Take 1 tablet by mouth once daily   triamterene-hydrochlorothiazide (MAXZIDE-25) 37.5-25 MG tablet Take 1 tablet by mouth once daily   albuterol (VENTOLIN HFA) 108 (90 Base) MCG/ACT inhaler INHALE 2 PUFFS INTO LUNGS EVERY 6 HOURS AS NEEDED FOR WHEEZING OR SHORTNESS OF BREATH (Patient not taking: Reported on 04/28/2021)   celecoxib (CELEBREX) 100 MG capsule TAKE 1 CAPSULE BY MOUTH ONCE DAILY .PATIENT  NEEDS  LABS  DONE  FOR  FURTHER  REFILLS (Patient  not taking: No sig reported)   diphenhydrAMINE (BENADRYL) 25 mg capsule Take 25 mg by mouth every 6 (six) hours as needed. (Patient not taking: No sig reported)   ondansetron (ZOFRAN) 4 MG tablet Take 1 tablet (4 mg total) by mouth every 8 (eight) hours as needed for nausea or vomiting. (Patient not taking: No sig reported)   valACYclovir (VALTREX) 1000 MG tablet Take 1 tablet (1,000 mg total) by mouth 2 (two) times daily. (Patient not taking: No sig reported)   No facility-administered medications prior to visit.    Review of Systems  Constitutional:  Negative for activity change and fatigue.  Respiratory:  Negative for cough and shortness of breath.   Cardiovascular:  Negative for chest pain and leg swelling.  Musculoskeletal:  Negative for arthralgias and myalgias.  Neurological:  Negative for dizziness, light-headedness and headaches.       Objective    BP 104/71 (BP Location: Left Arm, Patient Position: Sitting, Cuff Size: Large)   Pulse 80   Resp 16   Ht 5\' 10"  (1.778 m)   Wt 196 lb (88.9 kg)   SpO2 97%   BMI 28.12 kg/m  BP Readings from Last 3 Encounters:  04/28/21 104/71  07/17/20 133/81  12/27/19 130/88   Wt Readings from Last 3 Encounters:  04/28/21 196 lb (88.9 kg)  12/27/19 225 lb 3.2 oz (102.2 kg)  10/11/19 222 lb 3.2 oz (100.8 kg)       Physical Exam Vitals reviewed.  Constitutional:      Appearance: Normal appearance.  HENT:     Head: Normocephalic and atraumatic.     Right Ear: External ear normal.     Left Ear: External ear normal.  Eyes:     General: No scleral icterus.    Conjunctiva/sclera: Conjunctivae normal.  Cardiovascular:     Rate and Rhythm: Normal rate and regular rhythm.     Heart sounds: Normal heart sounds.  Pulmonary:     Effort: Pulmonary effort is normal.     Breath sounds: Normal breath sounds.  Abdominal:     Palpations: Abdomen is soft.  Musculoskeletal:     Cervical back: Neck supple.  Lymphadenopathy:     Cervical: No  cervical adenopathy.  Skin:    General: Skin is warm and dry.  Neurological:     Mental Status: She is alert and oriented to person, place, and time. Mental status is at baseline.  Psychiatric:        Mood and Affect: Mood normal.        Behavior: Behavior normal.      No results found for any visits on 04/28/21.  Assessment & Plan     1.  Other polyneuropathy Symptoms controlled with gabapentin - gabapentin (NEURONTIN) 800 MG tablet; Take 1 tablet (800 mg total) by mouth 3 (three) times daily.  Dispense: 270 tablet; Refill: 0  2. Gastroesophageal reflux disease May be able to cut back on omeprazole in the future.  Lost a significant amount of weight.  She is down 30 pounds from her last visit with dietary changes - omeprazole (PRILOSEC) 20 MG capsule; Take 1 capsule (20 mg total) by mouth daily.  Dispense: 90 capsule; Refill: 0  3. Hyperlipidemia, unspecified hyperlipidemia type Lipid profile when appropriate - rosuvastatin (CRESTOR) 10 MG tablet; Take 1 tablet (10 mg total) by mouth daily.  Dispense: 90 tablet; Refill: 2  4. Essential (primary) hypertension Controlled.  With significant weight loss may be able to stop her diuretic.  Do so today and follow-up in 2 to 3 months with home blood pressure readings present.  Stop Dyazide.  Follow-up 2 to 3 months. - triamterene-hydrochlorothiazide (MAXZIDE-25) 37.5-25 MG tablet; Take 1 tablet by mouth daily.  Dispense: 90 tablet; Refill: 0   No follow-ups on file.      I, Elizabeth Durie, MD, have reviewed all documentation for this visit. The documentation on 05/03/21 for the exam, diagnosis, procedures, and orders are all accurate and complete.    Clelia Trabucco Cranford Mon, MD  [facility] [phone] (phone) [phone] (fax)  [place]

## 2021-04-29 MED ORDER — OMEPRAZOLE 20 MG PO CPDR: 1.0000 | DELAYED_RELEASE_CAPSULE | Freq: Every day | ORAL | 0 refills | Status: DC

## 2021-04-29 MED ORDER — GABAPENTIN 800 MG PO TABS: 800.0000 mg | ORAL_TABLET | Freq: Three times a day (TID) | ORAL | 0 refills | Status: DC

## 2021-04-29 MED ORDER — TRIAMTERENE-HCTZ 37.5-25 MG PO TABS: 1.0000 | ORAL_TABLET | Freq: Every day | ORAL | 0 refills | Status: DC

## 2021-04-29 MED ORDER — ROSUVASTATIN CALCIUM 10 MG PO TABS: 10.0000 mg | ORAL_TABLET | Freq: Every day | ORAL | 2 refills | Status: DC

## 2021-05-06 ENCOUNTER — Other Ambulatory Visit: Payer: Self-pay

## 2021-05-06 DIAGNOSIS — G8929 Other chronic pain: Secondary | ICD-10-CM

## 2021-05-06 MED ORDER — CHLORZOXAZONE 500 MG PO TABS
ORAL_TABLET | ORAL | 0 refills | Status: DC
Start: 2021-05-06 — End: 2021-07-02

## 2021-05-06 NOTE — Telephone Encounter (Signed)
Copied from [place] [id number]. Topic: Quick Communication - Rx Refill/Question >> May 06, 2021  2:56 PM Loma [place] wrote: Medication: chlorzoxazone (PARAFON) 500 MG tablet Medication Date: 11/26/2020 Department: [facility] Ordering/Authorizing: Trinna Post, PA-C    Has the patient contacted their pharmacy? Yes.   (Agent: If no, request that the patient contact the pharmacy for the refill.) (Agent: If yes, when and what did the pharmacy advise?) pt had appt on 6/20 [place]/ states specifically needs this one, all others refilled/this one was not per Pharmacy  Preferred Pharmacy (with phone number or [place] name): [facility] [place] - [address] [place] [address] Phone: [phone] Fax: [phone] Hours: Not open 24 hours    Agent: Please be advised that RX refills may take up to 3 business days. We ask that you follow-up with your pharmacy.

## 2021-07-01 ENCOUNTER — Other Ambulatory Visit: Payer: Self-pay | Admitting: Family Medicine

## 2021-07-01 DIAGNOSIS — G8929 Other chronic pain: Secondary | ICD-10-CM

## 2021-07-01 NOTE — Telephone Encounter (Signed)
Requested medication (s) are due for refill today: yes  Requested medication (s) are on the active medication list:  yes   Last refill:  05/09/2021  Future visit scheduled: no  Notes to clinic:  this refill cannot be delegated    Requested Prescriptions  Pending Prescriptions Disp Refills   chlorzoxazone (PARAFON) 500 MG tablet [Pharmacy Med Name: Chlorzoxazone 500 MG Oral Tablet] 120 tablet 0    Sig: TAKE 1 TABLET BY MOUTH EVERY 6 HOURS AS NEEDED FOR MUSCLE SPASM     Not Delegated - Analgesics:  Muscle Relaxants Failed - 07/01/2021 10:28 AM      Failed - This refill cannot be delegated      Passed - Valid encounter within last 6 months    Recent Outpatient Visits           2 months ago Other polyneuropathy   [facility] Jerrol Banana., MD   11 months ago Annual physical exam   [facility] [place], Fabio Bering M, [place]   1 year ago Cough   [facility] Carles Collet M, [place]   1 year ago Chronic low back pain with sciatica, sciatica laterality unspecified, unspecified back pain laterality   [facility] Trinna Post, PA-C   1 year ago Essential (primary) hypertension   [place], [place], [place]

## 2021-07-21 ENCOUNTER — Other Ambulatory Visit: Payer: Self-pay | Admitting: Family Medicine

## 2021-07-21 DIAGNOSIS — K219 Gastro-esophageal reflux disease without esophagitis: Secondary | ICD-10-CM

## 2021-08-21 ENCOUNTER — Other Ambulatory Visit: Payer: Self-pay | Admitting: Family Medicine

## 2021-08-21 DIAGNOSIS — I10 Essential (primary) hypertension: Secondary | ICD-10-CM

## 2021-08-21 DIAGNOSIS — G8929 Other chronic pain: Secondary | ICD-10-CM

## 2021-08-21 DIAGNOSIS — M5440 Lumbago with sciatica, unspecified side: Secondary | ICD-10-CM

## 2021-08-22 NOTE — Telephone Encounter (Signed)
Labs out of date  Requested Prescriptions  Pending Prescriptions Disp Refills  . triamterene-hydrochlorothiazide (MAXZIDE-25) 37.5-25 MG tablet [Pharmacy Med Name: Triamterene-HCTZ 37.5-25 MG Oral Tablet] 90 tablet 0    Sig: Take 1 tablet by mouth once daily     Cardiovascular: Diuretic Combos Failed - 08/21/2021  3:36 PM      Failed - K in normal range and within 360 days    Potassium  Date Value Ref Range Status  07/17/2020 3.3 (L) 3.5 - 5.2 mmol/L Final         Failed - Na in normal range and within 360 days    Sodium  Date Value Ref Range Status  07/17/2020 143 134 - 144 mmol/L Final         Failed - Cr in normal range and within 360 days    Creatinine, Ser  Date Value Ref Range Status  07/17/2020 0.85 0.57 - 1.00 mg/dL Final         Failed - Ca in normal range and within 360 days    Calcium  Date Value Ref Range Status  07/17/2020 9.4 8.7 - 10.3 mg/dL Final         Passed - Last BP in normal range    BP Readings from Last 1 Encounters:  04/28/21 104/71         Passed - Valid encounter within last 6 months    Recent Outpatient Visits          3 months ago Other polyneuropathy   [facility] Jerrol Banana., MD   1 year ago Annual physical exam   [facility] Carles Collet M, [place]   1 year ago Cough   [facility] Carles Collet M, [place]   1 year ago Chronic low back pain with sciatica, sciatica laterality unspecified, unspecified back pain laterality   [facility] Trinna Post, PA-C   1 year ago Essential (primary) hypertension   [place], Wendee Beavers, [place]      Future Appointments            In 4 months Bacigalupo, Dionne Bucy, MD [facility], [place]

## 2021-08-22 NOTE — Telephone Encounter (Signed)
Requested medications are due for refill today yes  Requested medications are on the active medication list yes  Last refill 07/02/21  Last visit 07/17/21  Future visit scheduled 01/05/21  Notes to clinic This medication can not be delegated, please assess.    Requested Prescriptions  Pending Prescriptions Disp Refills   chlorzoxazone (PARAFON) 500 MG tablet [Pharmacy Med Name: Chlorzoxazone 500 MG Oral Tablet] 120 tablet 0    Sig: TAKE 1 TABLET BY MOUTH EVERY 6 HOURS AS NEEDED FOR MUSCLE SPASM     Not Delegated - Analgesics:  Muscle Relaxants Failed - 08/21/2021  3:36 PM      Failed - This refill cannot be delegated      Passed - Valid encounter within last 6 months    Recent Outpatient Visits           3 months ago Other polyneuropathy   [facility] Jerrol Banana., MD   1 year ago Annual physical exam   [facility] Carles Collet M, [place]   1 year ago Cough   [facility] Carles Collet M, [place]   1 year ago Chronic low back pain with sciatica, sciatica laterality unspecified, unspecified back pain laterality   [facility] Trinna Post, PA-C   1 year ago Essential (primary) hypertension   [place], Wendee Beavers, PA-C       Future Appointments             In 4 months Bacigalupo, Dionne Bucy, MD [facility], [place]

## 2021-08-27 ENCOUNTER — Other Ambulatory Visit: Payer: Self-pay | Admitting: Family Medicine

## 2021-08-27 DIAGNOSIS — G6289 Other specified polyneuropathies: Secondary | ICD-10-CM

## 2021-08-31 ENCOUNTER — Other Ambulatory Visit: Payer: Self-pay | Admitting: Family Medicine

## 2021-08-31 DIAGNOSIS — I10 Essential (primary) hypertension: Secondary | ICD-10-CM

## 2021-10-14 ENCOUNTER — Ambulatory Visit: Payer: Self-pay | Admitting: *Deleted

## 2021-10-14 NOTE — Telephone Encounter (Signed)
Reason for Disposition . [1] Sinus congestion (pressure, fullness) AND [2] present > 10 days  Answer Assessment - Initial Assessment Questions 1. LOCATION: "Where does it hurt?"      I have this congestion.   I have pain in my upper back that concerns me.   This is my third week of being sick. I do smoke.   I'm not coughing.    2. ONSET: "When did the sinus pain start?"  (e.g., hours, days)      I'm blowing clear stuff from my nose but it's thick in the mornings.   I've been sick since week before last.   My stomach was rumbling but no diarrhea.    I just can't get this congestion gone in my chest and all. 3. SEVERITY: "How bad is the pain?"   (Scale 1-10; mild, moderate or severe)   - MILD (1-3): doesn't interfere with normal activities    - MODERATE (4-7): interferes with normal activities (e.g., work or school) or awakens from sleep   - SEVERE (8-10): excruciating pain and patient unable to do any normal activities        Upper back pain is a nagging pain.   It's like pressure in my back.  Never had this before. 4. RECURRENT SYMPTOM: "Have you ever had sinus problems before?" If Yes, ask: "When was the last time?" and "What happened that time?"      I get sinus infections and URIs   Bad allergies 5. NASAL CONGESTION: "Is the nose blocked?" If Yes, ask: "Can you open it or must you breathe through your mouth?"     I'm blowing a lot out. 6. NASAL DISCHARGE: "Do you have discharge from your nose?" If so ask, "What color?"     It's clear 7. FEVER: "Do you have a fever?" If Yes, ask: "What is it, how was it measured, and when did it start?"      No fever      I had a cold but that all went away. 8. OTHER SYMPTOMS: "Do you have any other symptoms?" (e.g., sore throat, cough, earache, difficulty breathing)     No sore throat or earache and no difficulty breathing.   "I just don't feel good". 9. PREGNANCY: "Is there any chance you are pregnant?" "When was your last menstrual period?"      N/A  Protocols used: Sinus Pain or Congestion-A-AH

## 2021-10-14 NOTE — Telephone Encounter (Signed)
I returned pt's call.  She called earlier today c/o mucus and congestion she can't get rid of.  There are no appts available.  She was sick with a cold 8-9 days ago.  She is still having congestion in her nose.   She is c/o having pain in her upper back.   No coughing.   The congestion from her nose is clear but in the mornings it's thick.  "I just don't feel good and don't feel like I'm getting over this cold I had".   "I can't get rid of the congestion".     There are no appts available at [facility] so I've sent a note to see if the PA Junie Panning Mecum has an availability.   Pt was agreeable to being called back.

## 2021-10-16 ENCOUNTER — Other Ambulatory Visit: Payer: Self-pay

## 2021-10-16 ENCOUNTER — Other Ambulatory Visit: Payer: Self-pay | Admitting: Family Medicine

## 2021-10-16 ENCOUNTER — Encounter: Payer: Self-pay | Admitting: Physician Assistant

## 2021-10-16 ENCOUNTER — Ambulatory Visit (INDEPENDENT_AMBULATORY_CARE_PROVIDER_SITE_OTHER): Payer: Medicare Other | Admitting: Physician Assistant

## 2021-10-16 VITALS — BP 112/75 | HR 75 | Temp 98.1°F | Ht 70.0 in | Wt 192.0 lb

## 2021-10-16 DIAGNOSIS — K219 Gastro-esophageal reflux disease without esophagitis: Secondary | ICD-10-CM | POA: Diagnosis not present

## 2021-10-16 DIAGNOSIS — G8929 Other chronic pain: Secondary | ICD-10-CM

## 2021-10-16 DIAGNOSIS — E781 Pure hyperglyceridemia: Secondary | ICD-10-CM

## 2021-10-16 DIAGNOSIS — R0981 Nasal congestion: Secondary | ICD-10-CM | POA: Diagnosis not present

## 2021-10-16 DIAGNOSIS — Z720 Tobacco use: Secondary | ICD-10-CM

## 2021-10-16 DIAGNOSIS — Z716 Tobacco abuse counseling: Secondary | ICD-10-CM | POA: Diagnosis not present

## 2021-10-16 DIAGNOSIS — Z1231 Encounter for screening mammogram for malignant neoplasm of breast: Secondary | ICD-10-CM

## 2021-10-16 MED ORDER — BUPROPION HCL ER (SR) 150 MG PO TB12: ORAL_TABLET | ORAL | 1 refills | Status: DC

## 2021-10-16 MED ORDER — OMEPRAZOLE 20 MG PO CPDR
20.0000 mg | DELAYED_RELEASE_CAPSULE | Freq: Every day | ORAL | 1 refills | Status: DC
Start: 2021-10-16 — End: 2022-04-22

## 2021-10-16 MED ORDER — AZELASTINE-FLUTICASONE 137-50 MCG/ACT NA SUSP: 1.0000 | Freq: Two times a day (BID) | NASAL | 5 refills | Status: DC

## 2021-10-16 NOTE — Assessment & Plan Note (Addendum)
Discussed smoking cessation, advised that the most important thing is replacing the behavior with something healthier.  Prescribed Wellbutrin, take 1 pill for 3 days and then take twice a day.  Advised that overall this will help her feelings of anxiety as well as to help her sleep better.

## 2021-10-16 NOTE — Progress Notes (Signed)
Date:  10/16/2021   Name:  Elizabeth Hogan   DOB:  August 30, 1956   MRN:  [id number]   Chief Complaint: Nasal Congestion x 4 weeks  Elizabeth Hogan is a 65 year old female who presents today with persistent nasal congestion x4 weeks.  She states that about 4 weeks ago she felt pretty sick for the last for about 8 days, week, headaches, muscle aches, abdominal cramps and diarrhea that resolved but the nasal congestion persisted.  Has not tried much over-the-counter to improve her symptoms.  Denies any active fevers, other URI symptoms, headaches.  She also presents today with questions about smoking cessation.  States she took Chantix in the past but felt it did not help.  Currently smoking quarter pack to half pack a day, 1 pack lasts her 3 days.  She also reports difficulty sleeping, she can fall asleep but when she wakes up she is not able to fall back asleep.  Reports what keeps her up is usually racing thoughts.  On general feels anxious and on edge most days.  Lab Results  Component Value Date   NA 143 07/17/2020   K 3.3 (L) 07/17/2020   CO2 25 07/17/2020   GLUCOSE 75 07/17/2020   BUN 12 07/17/2020   CREATININE 0.85 07/17/2020   CALCIUM 9.4 07/17/2020   GFRNONAA 73 07/17/2020   Lab Results  Component Value Date   CHOL 171 07/17/2020   HDL 67 07/17/2020   LDLCALC 78 07/17/2020   TRIG 153 (H) 07/17/2020   CHOLHDL 2.6 07/17/2020   Lab Results  Component Value Date   TSH 2.480 07/17/2020   No results found for: HGBA1C Lab Results  Component Value Date   WBC 5.8 07/17/2020   HGB 13.2 07/17/2020   HCT 39.9 07/17/2020   MCV 92 07/17/2020   PLT 252 07/17/2020   Lab Results  Component Value Date   ALT 18 07/17/2020   AST 23 07/17/2020   ALKPHOS 81 07/17/2020   BILITOT 0.6 07/17/2020   No results found for: 25OHVITD2, 25OHVITD3, VD25OH   Review of Systems  Constitutional:  Negative for fatigue and fever.  HENT:  Positive for congestion.   Respiratory:  Positive for cough.  Negative for shortness of breath and wheezing.   Cardiovascular:  Negative for chest pain.  Neurological:  Negative for dizziness and headaches.   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   Anxiety 09/02/2015   Essential (primary) hypertension 08/04/2005    Allergies  Allergen Reactions   Augmentin [Amoxicillin-Pot Clavulanate] Itching   Codeine Itching   Codeine Sulfate Itching and Rash    Past Surgical History:  Procedure Laterality Date   ABDOMINAL HYSTERECTOMY  2005   still has cervix   CATARACT EXTRACTION W/PHACO Left 05/12/2019   Procedure: CATARACT EXTRACTION PHACO AND INTRAOCULAR LENS PLACEMENT ([place]) COMPLICATED  LEFT HEALON 5, VISION BLUE, SYMFONY LENS;  Surgeon: Leandrew Koyanagi, MD;  Location: [facility];  Service: Ophthalmology;  Laterality: Left;   CATARACT EXTRACTION W/PHACO Right 06/07/2019   Procedure: CATARACT EXTRACTION  PHACO AND INTRAOCULAR LENS PLACEMENT ([place])  COMPLICATED RIGHT;  Surgeon: Leandrew Koyanagi, MD;  Location: [facility];  Service: Ophthalmology;  Laterality: Right;  MALYUGIN   COLONOSCOPY WITH PROPOFOL N/A 02/11/2016   Procedure: COLONOSCOPY WITH PROPOFOL;  Surgeon: Lucilla Lame, MD;  Location: [facility];  Service: Endoscopy;  Laterality: N/A;   LAMINECTOMY  07/2010   Secondary to Job injury in 2010, Subsequent nerve damage and followed by [facility]      Social History   Tobacco Use   Smoking status: Every Day    Packs/day: 0.50    Years: 48.00     Pack years: 24.00    Types: Cigarettes   Smokeless tobacco: Never   Tobacco comments:    pt declines quitting at this time  Vaping Use   Vaping Use: Never used  Substance Use Topics   Alcohol use: Yes    Alcohol/week: 14.0 - 21.0 standard drinks    Types: 14 - 21 Cans of beer per week    Comment: 2-3 beers a night   Drug use: Yes    Types: Cocaine    Comment: current use.     Medication list has been reviewed and updated.  Current Meds  Medication Sig   amLODipine (NORVASC) 5 MG tablet Take 1 tablet by mouth once daily   chlorzoxazone (PARAFON) 500 MG tablet TAKE 1 TABLET BY MOUTH EVERY 6 HOURS AS NEEDED FOR MUSCLE SPASM   gabapentin (NEURONTIN) 800 MG tablet TAKE 1 TABLET BY MOUTH THREE TIMES DAILY   loratadine (CLARITIN) 10 MG tablet Take 10 mg by mouth daily. Unsure which allergy medication is comparable to   omeprazole (PRILOSEC) 20 MG capsule Take 1 capsule by mouth once daily   rosuvastatin (CRESTOR) 10 MG tablet Take 1 tablet (10 mg total) by mouth daily.   triamterene-hydrochlorothiazide (MAXZIDE-25) 37.5-25 MG tablet Take 1 tablet by mouth once daily   valACYclovir (VALTREX) 1000 MG tablet Take 1 tablet (1,000 mg total) by mouth 2 (two) times daily.    PHQ 2/9 Scores 10/16/2021 06/26/2020 12/27/2019 11/16/2018  PHQ - 2 Score 0 0 0 1  PHQ- 9 Score 1 - - 4    No flowsheet data found.  BP Readings from Last 3 Encounters:  04/28/21 104/71  07/17/20 133/81  12/27/19 130/88    Physical Exam Constitutional:      General: She is awake.     Appearance: She is well-developed.  HENT:     Head: Normocephalic.     Right Ear: Tympanic membrane normal.     Left Ear: Tympanic membrane normal.     Nose: Congestion present. No rhinorrhea.     Mouth/Throat:     Mouth: Mucous membranes are moist.     Pharynx: Posterior oropharyngeal erythema present.  Eyes:     Conjunctiva/sclera: Conjunctivae normal.  Cardiovascular:     Rate and Rhythm: Normal rate and regular  rhythm.     Heart sounds: Normal heart sounds.  Pulmonary:     Effort: Pulmonary effort is normal.     Breath sounds: Normal breath sounds.  Skin:    General: Skin is warm.  Neurological:     Mental Status: She is alert and oriented to person, place, and time.  Psychiatric:        Attention and Perception: Attention normal.        Mood and Affect: Mood normal.        Speech: Speech  normal.        Behavior: Behavior is cooperative.    Wt Readings from Last 3 Encounters:  10/16/21 192 lb (87.1 kg)  04/28/21 196 lb (88.9 kg)  12/27/19 225 lb 3.2 oz (102.2 kg)    Ht 5\' 10"  (1.778 m)   Wt 192 lb (87.1 kg)   BMI 27.55 kg/m   Assessment and Plan:  Nasal congestion Advised steam showers, saline rinses, prescribed azelastine Flonase nasal spray, advised to use twice a day.  Problem List Items Addressed This Visit       Digestive   Acid reflux   Relevant Medications   omeprazole (PRILOSEC) 20 MG capsule     Other   Tobacco abuse    Discussed smoking cessation, advised that the most [place] thing is replacing the behavior with something healthier.  Prescribed Wellbutrin, take 1 pill for 3 days and then take twice a day.  Advised that overall this will help her feelings of anxiety as well as to help her sleep better.      Relevant Medications   buPROPion (WELLBUTRIN SR) 150 MG 12 hr tablet   Other Relevant Orders   CBC   Other Visit Diagnoses     Nasal congestion    -  Primary   Relevant Medications   Azelastine-Fluticasone 137-50 MCG/ACT SUSP   Encounter for smoking cessation counseling       Relevant Medications   buPROPion (WELLBUTRIN SR) 150 MG 12 hr tablet   Hypertriglyceridemia       Relevant Orders   Comprehensive Metabolic Panel (CMET)   Lipid Profile   Encounter for screening mammogram for breast cancer       Relevant Orders   MM 3D SCREEN BREAST BILATERAL      Return in about 3 months (around 01/14/2022) for smoking cessation.  I, [name], PA-C  have reviewed all documentation for this visit. The documentation on  10/16/2021 for the exam, diagnosis, /procedures, and orders are all accurate and complete./  [name], PA-C [facility] [address] [place], [place], [address] Office: [phone] Fax: [phone]

## 2021-10-17 LAB — COMPREHENSIVE METABOLIC PANEL
ALT: 22 IU/L (ref 0–32)
AST: 30 IU/L (ref 0–40)
Albumin/Globulin Ratio: 1.8 (ref 1.2–2.2)
Albumin: 4.8 g/dL (ref 3.8–4.8)
Alkaline Phosphatase: 81 IU/L (ref 44–121)
BUN/Creatinine Ratio: 13 (ref 12–28)
BUN: 11 mg/dL (ref 8–27)
Bilirubin Total: 0.3 mg/dL (ref 0.0–1.2)
CO2: 25 mmol/L (ref 20–29)
Calcium: 9.7 mg/dL (ref 8.7–10.3)
Chloride: 104 mmol/L (ref 96–106)
Creatinine, Ser: 0.88 mg/dL (ref 0.57–1.00)
Globulin, Total: 2.7 g/dL (ref 1.5–4.5)
Glucose: 86 mg/dL (ref 70–99)
Potassium: 4 mmol/L (ref 3.5–5.2)
Sodium: 144 mmol/L (ref 134–144)
Total Protein: 7.5 g/dL (ref 6.0–8.5)
eGFR: 73 mL/min/{1.73_m2} (ref >59)

## 2021-10-17 LAB — LIPID PANEL
Chol/HDL Ratio: 2.3 ratio (ref 0.0–4.4)
Cholesterol, Total: 177 mg/dL (ref 100–199)
HDL: 78 mg/dL (ref >39)
LDL Chol Calc (NIH): 81 mg/dL (ref 0–99)
Triglycerides: 103 mg/dL (ref 0–149)
VLDL Cholesterol Cal: 18 mg/dL (ref 5–40)

## 2021-10-17 LAB — CBC
Hematocrit: 38.2 % (ref 34.0–46.6)
Hemoglobin: 12.7 g/dL (ref 11.1–15.9)
MCH: 30.5 pg (ref 26.6–33.0)
MCHC: 33.2 g/dL (ref 31.5–35.7)
MCV: 92 fL (ref 79–97)
Platelets: 260 10*3/uL (ref 150–450)
RBC: 4.17 x10E6/uL (ref 3.77–5.28)
RDW: 14.3 % (ref 11.7–15.4)
WBC: 4.2 10*3/uL (ref 3.4–10.8)

## 2021-12-02 ENCOUNTER — Other Ambulatory Visit: Payer: Self-pay | Admitting: Family Medicine

## 2021-12-02 DIAGNOSIS — I10 Essential (primary) hypertension: Secondary | ICD-10-CM

## 2021-12-02 NOTE — Telephone Encounter (Signed)
Requested medication (s) are due for refill today:   Yes  Requested medication (s) are on the active medication list:   Yes  Future visit scheduled:   Yes 01/05/2022   Last ordered: 09/01/2021 #90, 0 refills  Returned for provider to review for refills prior to upcoming appt since protocol failed for a valid encounter within the last 6 mo.   Requested Prescriptions  Pending Prescriptions Disp Refills   amLODipine (NORVASC) 5 MG tablet [Pharmacy Med Name: amLODIPine Besylate 5 MG Oral Tablet] 90 tablet 0    Sig: Take 1 tablet by mouth once daily     Cardiovascular:  Calcium Channel Blockers Failed - 12/02/2021  8:25 AM      Failed - Valid encounter within last 6 months    Recent Outpatient Visits           1 month ago Nasal congestion   [facility] [name], PA-C   7 months ago Other polyneuropathy   [facility] Jerrol Banana., MD   1 year ago Annual physical exam   [facility] Carles Collet M, [place]   1 year ago Cough   [facility] Carles Collet M, [place]   1 year ago Chronic low back pain with sciatica, sciatica laterality unspecified, unspecified back pain laterality   [facility] Trinna Post, PA-C       Future Appointments             In 1 month Bacigalupo, Dionne Bucy, MD [facility]            Passed - Last BP in normal range    BP Readings from Last 1 Encounters:  10/16/21 112/75

## 2021-12-17 ENCOUNTER — Other Ambulatory Visit: Payer: Self-pay | Admitting: Family Medicine

## 2021-12-17 DIAGNOSIS — G8929 Other chronic pain: Secondary | ICD-10-CM

## 2021-12-17 DIAGNOSIS — G6289 Other specified polyneuropathies: Secondary | ICD-10-CM

## 2021-12-17 NOTE — Telephone Encounter (Signed)
Requested medication (s) are due for refill today:   Yes for gabapentin;    Provider to review for Parafon  Requested medication (s) are on the active medication list:   Yes for both  Future visit scheduled:   Yes   Last ordered: gabapentin 08/27/2021 #270, 0 refills;   Parafon 10/16/2021 #120, 0 refills   Returned because it's a non delegated refill    Requested Prescriptions  Pending Prescriptions Disp Refills   gabapentin (NEURONTIN) 800 MG tablet [Pharmacy Med Name: Gabapentin 800 MG Oral Tablet] 270 tablet 0    Sig: TAKE 1 TABLET BY [facility]     Neurology: Anticonvulsants - gabapentin Passed - 12/17/2021  5:30 AM      Passed - Cr in normal range and within 360 days    Creatinine, Ser  Date Value Ref Range Status  10/16/2021 0.88 0.57 - 1.00 mg/dL Final          Passed - Completed PHQ-2 or PHQ-9 in the last 360 days      Passed - Valid encounter within last 12 months    Recent Outpatient Visits           2 months ago Nasal congestion   [facility] [name], PA-C   7 months ago Other polyneuropathy   [facility] Jerrol Banana., MD   1 year ago Annual physical exam   [facility] Carles Collet M, [place]   1 year ago Cough   [facility] Carles Collet M, [place]   1 year ago Chronic low back pain with sciatica, sciatica laterality unspecified, unspecified back pain laterality   [facility] Trinna Post, [place]       Future Appointments             In 2 weeks Bacigalupo, Dionne Bucy, MD [facility]             chlorzoxazone (PARAFON) 500 MG tablet [Pharmacy Med Name: Chlorzoxazone 500 MG Oral Tablet] 120 tablet 0    Sig: TAKE 1 TABLET BY MOUTH EVERY 6 HOURS AS NEEDED FOR MUSCLE SPASM     Not Delegated - Analgesics:  Muscle Relaxants Failed - 12/17/2021  5:30 AM      Failed - This refill cannot be delegated      Failed - Valid encounter  within last 6 months    Recent Outpatient Visits           2 months ago Nasal congestion   [facility] [name], PA-C   7 months ago Other polyneuropathy   [facility] Jerrol Banana., MD   1 year ago Annual physical exam   [facility] Carles Collet M, [place]   1 year ago Cough   [facility] Carles Collet M, [place]   1 year ago Chronic low back pain with sciatica, sciatica laterality unspecified, unspecified back pain laterality   [facility] Trinna Post, [place]       Future Appointments             In 2 weeks Bacigalupo, Dionne Bucy, MD [facility], [place]

## 2021-12-22 ENCOUNTER — Ambulatory Visit: Payer: Self-pay

## 2021-12-22 NOTE — Telephone Encounter (Signed)
°  Chief Complaint: HTN Symptoms: BP 122/98, headache and pressure Frequency: 2 days Pertinent Negatives: Patient denies chest pain Disposition: [] ED /[] Urgent Care (no appt availability in office) / [x] Appointment(In office/virtual)/ []  [place] [facility]/ [] Home Care/ [] Refused Recommended Disposition /[] [facility]/ []  Follow-up with PCP Additional Notes: Pt states she hasn't missed any doses of BP meds and had to have medication changed last time this occurred. Care advice given and pt verbalized understanding.   Reason for Disposition  [3] Systolic BP  >= 846 OR Diastolic >= 80 AND [6] taking BP medications  Answer Assessment - Initial Assessment Questions 1. BLOOD PRESSURE: "What is the blood pressure?" "Did you take at least two measurements 5 minutes apart?"     122/98 2. ONSET: "When did you take your blood pressure?"     Few mins ago  3. HOW: "How did you obtain the blood pressure?" (e.g., visiting nurse, automatic home BP monitor)     Manual  4. HISTORY: "Do you have a history of high blood pressure?"     yes 5. MEDICATIONS: "Are you taking any medications for blood pressure?" "Have you missed any doses recently?"     Yes and no missed doses  6. OTHER SYMPTOMS: "Do you have any symptoms?" (e.g., headache, chest pain, blurred vision, difficulty breathing, weakness)     Headache, pressure  Protocols used: Blood Pressure - High-A-AH

## 2021-12-23 ENCOUNTER — Ambulatory Visit (INDEPENDENT_AMBULATORY_CARE_PROVIDER_SITE_OTHER): Payer: Medicare Other | Admitting: Family Medicine

## 2021-12-23 ENCOUNTER — Encounter: Payer: Self-pay | Admitting: Family Medicine

## 2021-12-23 ENCOUNTER — Other Ambulatory Visit: Payer: Self-pay

## 2021-12-23 VITALS — BP 113/83 | HR 81 | Temp 98.4°F | Resp 16 | Wt 193.1 lb

## 2021-12-23 DIAGNOSIS — R519 Headache, unspecified: Secondary | ICD-10-CM | POA: Diagnosis not present

## 2021-12-23 MED ORDER — NAPROXEN 500 MG PO TABS: 500.0000 mg | ORAL_TABLET | Freq: Two times a day (BID) | ORAL | 0 refills | Status: AC

## 2021-12-23 NOTE — Progress Notes (Signed)
° ° °  SUBJECTIVE:   CHIEF COMPLAINT / HPI:   HEADACHES - intermittent since last Friday. Mainly at night. Took tylenol eased some.  - frontal, back of neck. Shoots up R side of face. - worse with smoking, alcohol - +photophobia/phonophobia - years ago, similar symptoms. Had to change BP meds - started back taking dyazide due to leg swelling - neck pain x3 weeks. No trauma.  - ASA didn't help, bruised - woke up with headache this morning.   Duration: days, since last Friday. Quality: sharp Frequency: intermittent Location: frontal, back of neck and shoots up R side of face Radiation: yes Time of day headache occurs: mainly at night but will have in morning also Alleviating factors: tylenol Aggravating factors: smoking, alcohol Headache status at time of visit: current headache Treatments attempted: tylenol   Nausea:  no Vomiting: no Photophobia:  yes Phonophobia:  yes Jaw claudication: no Confusion:  no Gait disturbance/ataxia:  no Behavioral changes:  no Fevers:  no Diplopia, loss of vision: no. Does feel she is having some blurry vision.  OBJECTIVE:   BP 113/83    Pulse 81    Temp 98.4 F (36.9 C) (Oral)    Resp 16    Wt 193 lb 1.6 oz (87.6 kg)    SpO2 100%    BMI 27.71 kg/m   Gen: well appearing, in NAD Card: RRR Lungs: CTAB Ext: WWP, no edema MSK: Full ROM, strength 5/5 to U/LE bilaterally, deep reflexes intact, normal gait.  hypertonic trap musculature, nonTTP. No edema.  Neuro: Alert and oriented, speech normal.  Optic field normal. PERRL, Extraocular movements intact.  Intact symmetric sensation to light touch of face and extremities bilaterally.  Hearing grossly intact bilaterally.  Tongue protrudes normally with no deviation.  Shoulder shrug, smile symmetric. Finger to nose normal. NonTTP over temporal area.   ASSESSMENT/PLAN:   Headache Likely MSK given increased pain with flexion and hypertonicity of paravertebral musculature. NonTTP over temporal area  and without vision loss or jaw claudication, unlikely temporal arteritis, however will check ESR, CRP. 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 Will treat conservatively with NSAIDs, heat. F/u with eye doctor soon given concern for blurry vision, Snellen testing wnl today. F/u in 1 month.     Myles Gip, DO

## 2021-12-23 NOTE — Assessment & Plan Note (Signed)
Likely MSK given increased pain with flexion and hypertonicity of paravertebral musculature. NonTTP over temporal area and without vision loss or jaw claudication, unlikely temporal arteritis, however will check ESR, CRP. 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 Will treat conservatively with NSAIDs, heat. F/u with eye doctor soon given concern for blurry vision, Snellen testing wnl today. F/u in 1 month.

## 2021-12-24 ENCOUNTER — Ambulatory Visit: Payer: Self-pay | Admitting: *Deleted

## 2021-12-24 ENCOUNTER — Emergency Department
Admission: EM | Admit: 2021-12-24 | Discharge: 2021-12-24 | Disposition: A | Discharge status: Home / Self Care | Payer: Medicare Other | Attending: Emergency Medicine | Admitting: Emergency Medicine

## 2021-12-24 ENCOUNTER — Other Ambulatory Visit: Payer: Self-pay

## 2021-12-24 ENCOUNTER — Telehealth: Payer: Self-pay | Admitting: Family Medicine

## 2021-12-24 ENCOUNTER — Encounter: Payer: Self-pay | Admitting: Emergency Medicine

## 2021-12-24 ENCOUNTER — Emergency Department: Payer: Medicare Other

## 2021-12-24 DIAGNOSIS — R519 Headache, unspecified: Secondary | ICD-10-CM | POA: Diagnosis not present

## 2021-12-24 DIAGNOSIS — I10 Essential (primary) hypertension: Secondary | ICD-10-CM | POA: Insufficient documentation

## 2021-12-24 DIAGNOSIS — M542 Cervicalgia: Secondary | ICD-10-CM | POA: Insufficient documentation

## 2021-12-24 DIAGNOSIS — H538 Other visual disturbances: Secondary | ICD-10-CM | POA: Diagnosis not present

## 2021-12-24 DIAGNOSIS — I6521 Occlusion and stenosis of right carotid artery: Secondary | ICD-10-CM | POA: Diagnosis not present

## 2021-12-24 LAB — CBC
HCT: 40 % (ref 36.0–46.0)
Hemoglobin: 12.9 g/dL (ref 12.0–15.0)
MCH: 30.3 pg (ref 26.0–34.0)
MCHC: 32.3 g/dL (ref 30.0–36.0)
MCV: 93.9 fL (ref 80.0–100.0)
Platelets: 266 10*3/uL (ref 150–400)
RBC: 4.26 MIL/uL (ref 3.87–5.11)
RDW: 14.4 % (ref 11.5–15.5)
WBC: 4 10*3/uL (ref 4.0–10.5)
nRBC: 0 % (ref 0.0–0.2)

## 2021-12-24 LAB — BASIC METABOLIC PANEL
Anion gap: 6 (ref 5–15)
BUN: 13 mg/dL (ref 8–23)
CO2: 28 mmol/L (ref 22–32)
Calcium: 9.5 mg/dL (ref 8.9–10.3)
Chloride: 106 mmol/L (ref 98–111)
Creatinine, Ser: 0.75 mg/dL (ref 0.44–1.00)
GFR, Estimated: >60 mL/min (ref >60)
Glucose, Bld: 91 mg/dL (ref 70–99)
Potassium: 3.7 mmol/L (ref 3.5–5.1)
Sodium: 140 mmol/L (ref 135–145)

## 2021-12-24 LAB — TROPONIN I (HIGH SENSITIVITY): Troponin I (High Sensitivity): 4 ng/L (ref <18)

## 2021-12-24 MED ORDER — ONDANSETRON HCL 4 MG/2ML IJ SOLN
4.0000 mg | Freq: Once | INTRAMUSCULAR | Status: AC
  Administered 2021-12-24: 4 mg via INTRAVENOUS
  Filled 2021-12-24: qty 2

## 2021-12-24 MED ORDER — PREDNISONE 50 MG PO TABS: 50.0000 mg | ORAL_TABLET | Freq: Every day | ORAL | 0 refills | Status: DC

## 2021-12-24 MED ORDER — KETOROLAC TROMETHAMINE 30 MG/ML IJ SOLN
15.0000 mg | Freq: Once | INTRAMUSCULAR | Status: AC
  Administered 2021-12-24: 15 mg via INTRAVENOUS
  Filled 2021-12-24: qty 1

## 2021-12-24 MED ORDER — METHOCARBAMOL 500 MG PO TABS
500.0000 mg | ORAL_TABLET | Freq: Four times a day (QID) | ORAL | 0 refills | Status: DC | PRN
Start: 2021-12-24 — End: 2022-07-22

## 2021-12-24 MED ORDER — BUTALBITAL-APAP-CAFFEINE 50-325-40 MG PO TABS: 1.0000 | ORAL_TABLET | Freq: Four times a day (QID) | ORAL | 0 refills | Status: AC | PRN

## 2021-12-24 MED ORDER — IOHEXOL 350 MG/ML SOLN
75.0000 mL | Freq: Once | INTRAVENOUS | Status: AC | PRN
  Administered 2021-12-24: 75 mL via INTRAVENOUS
  Filled 2021-12-24: qty 75

## 2021-12-24 MED ORDER — BUTALBITAL-APAP-CAFFEINE 50-325-40 MG PO TABS
1.0000 | ORAL_TABLET | Freq: Once | ORAL | Status: AC
  Administered 2021-12-24: 1 via ORAL
  Filled 2021-12-24: qty 1

## 2021-12-24 MED ORDER — DIPHENHYDRAMINE HCL 50 MG/ML IJ SOLN
25.0000 mg | Freq: Once | INTRAMUSCULAR | Status: AC
  Administered 2021-12-24: 25 mg via INTRAMUSCULAR
  Filled 2021-12-24: qty 1

## 2021-12-24 NOTE — Telephone Encounter (Signed)
°  Chief Complaint: dizziness, headache, vision changes, unsteady-off balance- triage stopped- patient advised ED now- call 911 if does not have someone to drive her. Symptoms:  Frequency:  Pertinent Negatives: Patient denies  Disposition: [x] ED /[] Urgent Care (no appt availability in office) / [] Appointment(In office/virtual)/ []  [facility]/ [] Home Care/ [] Refused Recommended Disposition /[]  Mobile Bus/ []  Follow-up with PCP Additional Notes:

## 2021-12-24 NOTE — Telephone Encounter (Signed)
Patient's daughter Servando Salina called and advised since her mom is in the ED, that provider determines what needs to be done for her as far as testing. She says her mom is going back now for a CT scan and that she's on the way back to the hospital as soon as she can. I advised to go so she can be there to hear the results, also advised the provider will determine if a MRI is needed based on the CT results. She verbalized understanding.

## 2021-12-24 NOTE — ED Provider Notes (Signed)
[facility] Provider Note  Patient Contact: 3:35 PM (approximate)   History   Hypertension   HPI  Elizabeth Hogan is a 66 y.o. female who presents the emergency department complaining of neck pain, headache, hypertension.  Patient states that she has had an ongoing headache for the past 5 days.  She typically does not have any headaches and does not have any history of headache syndromes.  Patient states that she has had no trauma but the symptoms initially began with which he described as a "crick" in her neck.  Pain began on the right side of her neck.  No pain at rest but with movement she feels pain and has had some slight torticollis.  No fevers or chills, URI symptoms.  She saw her primary care yesterday and was diagnosed with musculoskeletal neck pain likely leading to headache.  When symptoms did not improve today she presents for evaluation.  She has been slightly hypertensive, states that she does have a history of hypertension and is typically well controlled on her medications.  Her blood pressure has been up since her neck pain started.  She states that when the headache is at its worst she does have some blurred vision but there is no loss of vision.  Headache has subsided the vision improves back to normal.   No unilateral weakness.  Patient denies any difficulty formulating thoughts or words.  No history of previous CVA.     Physical Exam   Triage Vital Signs: ED Triage Vitals  Enc Vitals Group     BP 12/24/21 1429 (!) 133/102     Pulse Rate 12/24/21 1429 76     Resp 12/24/21 1429 18     Temp 12/24/21 1429 98.6 F (37 C)     Temp Source 12/24/21 1429 Oral     SpO2 12/24/21 1429 98 %     Weight 12/24/21 1427 193 lb 2 oz (87.6 kg)     Height 12/24/21 1427 5\' 10"  (1.778 m)     Head Circumference --      Peak Flow --      Pain Score 12/24/21 1427 8     Pain Loc --      Pain Edu? --      Excl. in [place]? --     Most recent vital signs: Vitals:    12/24/21 1535 12/24/21 1818  BP: (!) 145/90 (!) 150/81  Pulse: 67 69  Resp: 16 18  Temp:    SpO2: 97% 98%     General: Alert and in no acute distress. Eyes:  PERRL. EOMI. Head: No acute traumatic findings  Neck: No stridor. No midline cervical spine tenderness to palpation. Slight tenderness over cervical paraspinal muscle and trapezius muscle right side.  Pulses and sensation intact and equal upper extremities.  Cardiovascular:  Good peripheral perfusion Respiratory: Normal respiratory effort without tachypnea or retractions. Lungs CTAB. Good air entry to the bases with no decreased or absent breath sounds. Musculoskeletal: Full range of motion to all extremities.  Neurologic:  No gross focal neurologic deficits are appreciated.  Cranial nerves II through XII grossly intact.  Negative Romberg's and pronator drift.  Equal grip strength upper extremities. Skin:   No rash noted Other:   ED Results / Procedures / Treatments   Labs (all labs ordered are listed, but only abnormal results are displayed) Labs Reviewed  BASIC METABOLIC PANEL  CBC  TROPONIN I (HIGH SENSITIVITY)     EKG  RADIOLOGY  I personally viewed and evaluated these images as part of my medical decision making, as well as reviewing the written report by the radiologist.  ED Provider Interpretation: No evidence of acute CVA.  Patient does have a 1 mm finding in the right supraclinoid ICA concerning for possible aneurysm  CT ANGIO HEAD NECK W WO CM  Result Date: 12/24/2021 CLINICAL DATA:  headache, R neck pain, blurred vision, HTN EXAM: CT ANGIOGRAPHY HEAD AND NECK TECHNIQUE: Multidetector CT imaging of the head and neck was performed using the standard protocol during bolus administration of intravenous contrast. Multiplanar CT image reconstructions and MIPs were obtained to evaluate the vascular anatomy. Carotid stenosis measurements (when applicable) are obtained utilizing NASCET criteria, using the  distal internal carotid diameter as the denominator. RADIATION DOSE REDUCTION: This exam was performed according to the departmental dose-optimization program which includes automated exposure control, adjustment of the mA and/or kV according to patient size and/or use of iterative reconstruction technique. CONTRAST:  31mL OMNIPAQUE IOHEXOL 350 MG/ML SOLN COMPARISON:  CT head 11/18/2017. FINDINGS: CT HEAD FINDINGS Brain: No evidence of acute infarction, hemorrhage, hydrocephalus, extra-axial collection or mass lesion/mass effect. Partially empty sella. Vascular: See below. Skull: No acute fracture. Sinuses: Visualized sinuses are largely clear. Orbits: No acute finding. Review of the MIP images confirms the above findings CTA NECK FINDINGS Aortic arch: Great vessel origins are patent without significant stenosis. Right carotid system: No evidence of dissection, stenosis (50% or greater) or occlusion. Mild noncalcific atherosclerosis at the carotid bifurcation. Left carotid system: No evidence of dissection, stenosis (50% or greater) or occlusion. Vertebral arteries: Codominant. No evidence of dissection, stenosis (50% or greater) or occlusion. Skeleton: Mild degenerative change in the imaged spine. Other neck: No evidence of acute abnormality on limited assessment. Upper chest: No consolidation in the visualized lung apices. Probable mild emphysema. Review of the MIP images confirms the above findings CTA HEAD FINDINGS Anterior circulation: Bilateral intracranial ICAs MCAs, and ACAs are patent without proximal hemodynamically significant stenosis. Limited distal evaluation due to venous contrast timing/contamination. Tiny (1 mm) inferiorly directed outpouching arising from the supraclinoid right ICA (series 12, image 85). Posterior circulation: Bilateral intradural vertebral arteries, basilar artery, and posterior cerebral arteries are patent without proximal hemodynamically significant stenosis. Venous sinuses: No  evidence of dural venous sinus thrombosis. Review of the MIP images confirms the above findings IMPRESSION: CT head: 1. No evidence of acute intracranial abnormality. 2. Partially empty sella, which is often a normal anatomic variant but can be associated with idiopathic intracranial hypertension. CTA: 1. No large vessel occlusion or proximal hemodynamically significant stenosis in the head or neck. Distal evaluation is limited by venous contamination/timing. 2. Tiny (1 mm) inferiorly directed outpouching arising from the supraclinoid right ICA, which could represent a tiny aneurysm versus infundibulum with vessel not well seen. Electronically Signed   By: Margaretha Sheffield M.D.   On: 12/24/2021 16:53    PROCEDURES:  Critical Care performed: No  Procedures   MEDICATIONS ORDERED IN ED: Medications  iohexol (OMNIPAQUE) 350 MG/ML injection 75 mL (75 mLs Intravenous Contrast Given 12/24/21 1627)  ketorolac (TORADOL) 30 MG/ML injection 15 mg (15 mg Intravenous Given 12/24/21 1811)  diphenhydrAMINE (BENADRYL) injection 25 mg (25 mg Intramuscular Given 12/24/21 1811)  ondansetron (ZOFRAN) injection 4 mg (4 mg Intravenous Given 12/24/21 1811)  butalbital-acetaminophen-caffeine (FIORICET) 50-325-40 MG per tablet 1 tablet (1 tablet Oral Given 12/24/21 1814)     IMPRESSION / MDM / ASSESSMENT AND PLAN / ED COURSE  I  reviewed the triage vital signs and the nursing notes.                              Differential diagnosis includes, but is not limited to, CVA, aneurysm, tension type headache   Patient's diagnosis is consistent with headache, possible aneurysm.  Patient presented to the emergency department with a headache x4 days.  Starts in her neck extends into the occipital skull and then turned into a global headache.  No headache history.  Overall exam is reassuring.  Patient did have some tenderness in the right trapezoid and right paraspinal muscle groups.  Neurologically was intact.  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  I discussed the patient with on-call neurosurgeon, Dr. Cari Caraway.  After reviewing the images he states that the patient is able to be treated for headache at this time and he will follow the patient as an outpatient for further work-up of this potential aneurysm.  Strict return precautions are discussed with the patient and her daughter.  Patient had a migraine cocktail with prescription for Fioricet at home for ongoing headache.  Again strict return precautions have been discussed with the patient.  Patient is given ED precautions to return to the ED for any worsening or new symptoms.        FINAL CLINICAL IMPRESSION(S) / ED DIAGNOSES   Final diagnoses:  Acute nonintractable headache, unspecified headache type     Rx / DC Orders   ED Discharge Orders          Ordered    butalbital-acetaminophen-caffeine (FIORICET) 50-325-40 MG tablet  Every 6 hours PRN        12/24/21 1759    methocarbamol (ROBAXIN) 500 MG tablet  Every 6 hours PRN        12/24/21 1759    predniSONE (DELTASONE) 50 MG tablet  Daily with breakfast        12/24/21 1759             Note:  This document was prepared using Dragon voice recognition software and may include unintentional dictation errors.   Brynda Peon 12/24/21 2251    Vanessa [place], MD 12/24/21 [phone]

## 2021-12-24 NOTE — Telephone Encounter (Signed)
Neck pain, headache, off balance Reason for Disposition  Headache  (and neurologic deficit)  Answer Assessment - Initial Assessment Questions 1. SYMPTOM: "What is the main symptom you are concerned about?" (e.g., weakness, numbness)     Off balance headache 2. ONSET: "When did this start?" (minutes, hours, days; while sleeping)     Started Friday 3. LAST NORMAL: "When was the last time you (the patient) were normal (no symptoms)?"     *No Answer* 4. PATTERN "Does this come and go, or has it been constant since it started?"  "Is it present now?"     *No Answer* 5. CARDIAC SYMPTOMS: "Have you had any of the following symptoms: chest pain, difficulty breathing, palpitations?"     *No Answer* 6. NEUROLOGIC SYMPTOMS: "Have you had any of the following symptoms: headache, dizziness, vision loss, double vision, changes in speech, unsteady on your feet?"     Headache,dizziness, vision changes, unsteady on feet 7. OTHER SYMPTOMS: "Do you have any other symptoms?"     *No Answer* 8. PREGNANCY: "Is there any chance you are pregnant?" "When was your last menstrual period?"     *No Answer*  Protocols used: Neurologic Deficit-A-AH

## 2021-12-24 NOTE — ED Triage Notes (Signed)
Pt comes into the ED via POV c/o HTN.  Pt states her BP was systolic of 948 and diastolic over 016.  Pt admits to headache and dizziness.  Pt saw her MD yesterday but they didn't adjust her medicine.  Pt is currently on BP medication.

## 2021-12-24 NOTE — Telephone Encounter (Signed)
Copied from [place] [id number]. Topic: General - Other >> Dec 24, 2021  2:54 PM Leward Quan A wrote: Reason for CRM: Patient daughter Sharman Cheek called in to inquire of Dr B that due to patient being at the ER if they think the patient need to have an MRI due to her symptoms with the pressure behind eyes headache . Patient is concerned since she herself had a stroke in the past and those were some of the same symptoms so asking if Dr B can communicate with the ER to please have an MRI of the patients head done today. Please advise and call Ph#  Ph# [phone]
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
## 2021-12-25 NOTE — Telephone Encounter (Signed)
FYI: Patient was seen in ED yesterday after speaking with [facility] triage nurse.

## 2021-12-25 NOTE — Telephone Encounter (Signed)
Noted  

## 2021-12-26 NOTE — Telephone Encounter (Signed)
Noted  

## 2022-01-05 ENCOUNTER — Encounter: Payer: Self-pay | Admitting: Family Medicine

## 2022-01-05 ENCOUNTER — Ambulatory Visit (INDEPENDENT_AMBULATORY_CARE_PROVIDER_SITE_OTHER): Payer: Medicare Other | Admitting: Family Medicine

## 2022-01-05 ENCOUNTER — Other Ambulatory Visit: Payer: Self-pay

## 2022-01-05 VITALS — BP 120/76 | HR 80 | Temp 97.8°F | Resp 16 | Wt 197.3 lb

## 2022-01-05 DIAGNOSIS — G4700 Insomnia, unspecified: Secondary | ICD-10-CM

## 2022-01-05 DIAGNOSIS — Z23 Encounter for immunization: Secondary | ICD-10-CM

## 2022-01-05 DIAGNOSIS — Z720 Tobacco use: Secondary | ICD-10-CM | POA: Diagnosis not present

## 2022-01-05 DIAGNOSIS — Z780 Asymptomatic menopausal state: Secondary | ICD-10-CM | POA: Diagnosis not present

## 2022-01-05 DIAGNOSIS — E785 Hyperlipidemia, unspecified: Secondary | ICD-10-CM

## 2022-01-05 DIAGNOSIS — F17200 Nicotine dependence, unspecified, uncomplicated: Secondary | ICD-10-CM | POA: Diagnosis not present

## 2022-01-05 DIAGNOSIS — I10 Essential (primary) hypertension: Secondary | ICD-10-CM

## 2022-01-05 DIAGNOSIS — Z716 Tobacco abuse counseling: Secondary | ICD-10-CM

## 2022-01-05 DIAGNOSIS — Z0000 Encounter for general adult medical examination without abnormal findings: Secondary | ICD-10-CM

## 2022-01-05 MED ORDER — BUPROPION HCL ER (XL) 150 MG PO TB24: 150.0000 mg | ORAL_TABLET | Freq: Every day | ORAL | 3 refills | Status: DC

## 2022-01-05 NOTE — Patient Instructions (Signed)
The CDC recommends two doses of Shingrix (the shingles vaccine) separated by 2 to 6 months for adults age 65 years and older. I recommend checking with your insurance plan regarding coverage for this vaccine.   Check at the pharmacy about the TDAP (tetanus shot)

## 2022-01-05 NOTE — Assessment & Plan Note (Signed)
wellbutrin may be contributing - see above Discussed sleep hygeine Can try melatonin

## 2022-01-05 NOTE — Assessment & Plan Note (Signed)
Previously well controlled Continue statin Repeat FLP and CMP annually 

## 2022-01-05 NOTE — Assessment & Plan Note (Signed)
Well controlled Continue current medications Reviewed recent metabolic panel F/u in 6 months  

## 2022-01-05 NOTE — Progress Notes (Signed)
I,Sulibeya S Dimas,acting as a Education administrator for Lavon Paganini, MD.,have documented all relevant documentation on the behalf of Lavon Paganini, MD,as directed by  Lavon Paganini, MD while in the presence of Lavon Paganini, MD.   Annual Wellness Visit     Patient: Elizabeth Hogan, Female    DOB: [birth year], 66 y.o.   MRN: [id number] Visit Date: 01/05/2022  Today's Provider: Lavon Paganini, MD   Chief Complaint  Patient presents with   Medicare [place] is a 66 y.o. female who presents today for her Annual Wellness Visit. She reports consuming a general diet. Home exercise routine includes house work. She generally feels well. She reports sleeping poorly. Waking up 4-5 hours after going to sleep and trouble falling back to sleep.  She does not have additional problems to discuss today.   HPI    Medications: Outpatient Medications Prior to Visit  Medication Sig   amLODipine (NORVASC) 5 MG tablet Take 1 tablet by mouth once daily   Azelastine-Fluticasone 137-50 MCG/ACT SUSP Place 1 spray into the nose every 12 (twelve) hours.   butalbital-acetaminophen-caffeine (FIORICET) 50-325-40 MG tablet Take 1 tablet by mouth every 6 (six) hours as needed for headache.   chlorzoxazone (PARAFON) 500 MG tablet TAKE 1 TABLET BY MOUTH EVERY 6 HOURS AS NEEDED FOR MUSCLE SPASM   gabapentin (NEURONTIN) 800 MG tablet TAKE 1 TABLET BY MOUTH THREE TIMES DAILY   loratadine (CLARITIN) 10 MG tablet Take 10 mg by mouth daily. Unsure which allergy medication is comparable to   methocarbamol (ROBAXIN) 500 MG tablet Take 1 tablet (500 mg total) by mouth every 6 (six) hours as needed (Neck pain).   naproxen (NAPROSYN) 500 MG tablet Take 1 tablet (500 mg total) by mouth 2 (two) times daily with a meal.   omeprazole (PRILOSEC) 20 MG capsule Take 1 capsule (20 mg total) by mouth daily.   predniSONE (DELTASONE) 50 MG tablet Take 1 tablet (50 mg total) by mouth daily with  breakfast.   rosuvastatin (CRESTOR) 10 MG tablet Take 1 tablet (10 mg total) by mouth daily.   triamterene-hydrochlorothiazide (MAXZIDE-25) 37.5-25 MG tablet Take 1 tablet by mouth once daily   valACYclovir (VALTREX) 1000 MG tablet Take 1 tablet (1,000 mg total) by mouth 2 (two) times daily.   [DISCONTINUED] buPROPion (WELLBUTRIN SR) 150 MG 12 hr tablet Take 1 pill for three days, then take twice a day in morning and evening   No facility-administered medications prior to visit.    Allergies  Allergen Reactions   Augmentin [Amoxicillin-Pot Clavulanate] Itching   Codeine Itching   Codeine Sulfate Itching and Rash    Patient Care Team: [name], PA-C as PCP - General (Physician Assistant) Bary Castilla Forest Gleason, MD (General Surgery) Pa, [facility])  Review of Systems  Endocrine: Positive for heat intolerance.  Musculoskeletal:  Positive for back pain.  Neurological:  Positive for light-headedness, numbness and headaches.  Psychiatric/Behavioral:  The patient is not nervous/anxious.    Last CBC Lab Results  Component Value Date   WBC 4.0 12/24/2021   HGB 12.9 12/24/2021   HCT 40.0 12/24/2021   MCV 93.9 12/24/2021   MCH 30.3 12/24/2021   RDW 14.4 12/24/2021   PLT 266 17/79/3903   Last metabolic panel Lab Results  Component Value Date   GLUCOSE 91 12/24/2021   NA 140 12/24/2021   K 3.7 12/24/2021   CL 106 12/24/2021   CO2 28 12/24/2021  BUN 13 12/24/2021   CREATININE 0.75 12/24/2021   GFRNONAA >60 12/24/2021   CALCIUM 9.5 12/24/2021   PROT 7.5 10/16/2021   ALBUMIN 4.8 10/16/2021   LABGLOB 2.7 10/16/2021   AGRATIO 1.8 10/16/2021   BILITOT 0.3 10/16/2021   ALKPHOS 81 10/16/2021   AST 30 10/16/2021   ALT 22 10/16/2021   ANIONGAP 6 12/24/2021   Last lipids Lab Results  Component Value Date   CHOL 177 10/16/2021   HDL 78 10/16/2021   LDLCALC 81 10/16/2021   TRIG 103 10/16/2021   CHOLHDL 2.3 10/16/2021   Last thyroid functions Lab  Results  Component Value Date   TSH 2.480 07/17/2020   T4TOTAL 5.9 03/22/2019        Objective    Vitals: BP 120/76 (BP Location: Right Arm, Patient Position: Sitting, Cuff Size: Large)    Pulse 80    Temp 97.8 F (36.6 C) (Temporal)    Resp 16    Wt 197 lb 4.8 oz (89.5 kg)    SpO2 100%    BMI 28.31 kg/m  BP Readings from Last 3 Encounters:  01/05/22 120/76  12/24/21 (!) 150/81  12/23/21 113/83   Wt Readings from Last 3 Encounters:  01/05/22 197 lb 4.8 oz (89.5 kg)  12/24/21 193 lb 2 oz (87.6 kg)  12/23/21 193 lb 1.6 oz (87.6 kg)       Physical Exam Vitals reviewed.  Constitutional:      General: She is not in acute distress.    Appearance: Normal appearance. She is well-developed. She is not diaphoretic.  HENT:     Head: Normocephalic and atraumatic.     Right Ear: Tympanic membrane, ear canal and external ear normal.     Left Ear: Tympanic membrane, ear canal and external ear normal.     Nose: Nose normal.     Mouth/Throat:     Mouth: Mucous membranes are moist.     Pharynx: Oropharynx is clear. No oropharyngeal exudate.  Eyes:     General: No scleral icterus.    Conjunctiva/sclera: Conjunctivae normal.     Pupils: Pupils are equal, round, and reactive to light.  Neck:     Thyroid: No thyromegaly.  Cardiovascular:     Rate and Rhythm: Normal rate and regular rhythm.     Pulses: Normal pulses.     Heart sounds: Normal heart sounds. No murmur heard. Pulmonary:     Effort: Pulmonary effort is normal. No respiratory distress.     Breath sounds: Normal breath sounds. No wheezing or rales.  Abdominal:     General: There is no distension.     Palpations: Abdomen is soft.     Tenderness: There is no abdominal tenderness.  Musculoskeletal:        General: No deformity.     Cervical back: Neck supple.     Right lower leg: No edema.     Left lower leg: No edema.  Lymphadenopathy:     Cervical: No cervical adenopathy.  Skin:    General: Skin is warm and dry.      Findings: No rash.  Neurological:     Mental Status: She is alert and oriented to person, place, and time. Mental status is at baseline.     Sensory: No sensory deficit.     Motor: No weakness.     Gait: Gait normal.  Psychiatric:        Mood and Affect: Mood normal.        Behavior:  Behavior normal.        Thought Content: Thought content normal.     Most recent functional status assessment: In your present state of health, do you have any difficulty performing the following activities: 01/05/2022  Hearing? N  Vision? N  Difficulty concentrating or making decisions? N  Walking or climbing stairs? Y  Dressing or bathing? N  Doing errands, shopping? N  Some recent data might be hidden   Most recent fall risk assessment: Fall Risk  01/05/2022  Falls in the past year? 0  Number falls in past yr: 0  Injury with Fall? 0  Risk for fall due to : No Fall Risks  Follow up Falls evaluation completed    Most recent depression screenings: PHQ 2/9 Scores 01/05/2022 10/16/2021  PHQ - 2 Score 0 0  PHQ- 9 Score 3 1   Most recent cognitive screening: 6CIT Screen 01/05/2022  What Year? 0 points  What month? 0 points  What time? 0 points  Count back from 20 0 points  Months in reverse 0 points  Repeat phrase 2 points  Total Score 2   Most recent Audit-C alcohol use screening Alcohol Use Disorder Test (AUDIT) 01/05/2022  1. How often do you have a drink containing alcohol? 4  2. How many drinks containing alcohol do you have on a typical day when you are drinking? 1  3. How often do you have six or more drinks on one occasion? 4  AUDIT-C Score 9  4. How often during the last year have you found that you were not able to stop drinking once you had started? -  5. How often during the last year have you failed to do what was normally expected from you because of drinking? -  6. How often during the last year have you needed a first drink in the morning to get yourself going after a heavy  drinking session? -  7. How often during the last year have you had a feeling of guilt of remorse after drinking? -  8. How often during the last year have you been unable to remember what happened the night before because you had been drinking? -  9. Have you or someone else been injured as a result of your drinking? -  10. Has a relative or friend or a doctor or another health worker been concerned about your drinking or suggested you cut down? -  Alcohol Use Disorder Identification Test Final Score (AUDIT) -  Alcohol Brief Interventions/Follow-up -   A score of 3 or more in women, and 4 or more in men indicates increased risk for alcohol abuse, EXCEPT if all of the points are from question 1   No results found for any visits on 01/05/22.  Assessment & Plan     Annual wellness visit done today including the all of the following: Reviewed patient's Family Medical History Reviewed and updated list of patient's medical providers Assessment of cognitive impairment was done Assessed patient's functional ability Established a written schedule for health screening [place] Completed and Reviewed  Exercise Activities and Dietary recommendations  Goals      Quit Smoking     Recommend to remove any items from the home that may cause slips or trips.     Reduce alcohol intake     Recommend to cut back on alcohol consumption by half and not exceeding more than 1 drink a day.  Immunization History  Administered Date(s) Administered   PNEUMOCOCCAL CONJUGATE-20 01/05/2022    Health Maintenance  Topic Date Due   COVID-19 Vaccine (1) Never done   TETANUS/TDAP  Never done   Zoster Vaccines- Shingrix (1 of 2) Never done   DEXA SCAN  Never done   INFLUENZA VACCINE  02/06/2022 (Originally 06/09/2021)   MAMMOGRAM  01/15/2022   PAP SMEAR-Modifier  11/17/2023   COLONOSCOPY (Pts 45-72yrs Insurance coverage will need to be confirmed)  02/10/2026   Pneumonia Vaccine  38+ Years old  Completed   Hepatitis C Screening  Completed   HIV Screening  Completed   HPV VACCINES  Aged Out     Discussed health benefits of physical activity, and encouraged her to engage in regular exercise appropriate for her age and condition.    Problem List Items Addressed This Visit       Cardiovascular and Mediastinum   Essential (primary) hypertension    Well controlled Continue current medications Reviewed recent  metabolic panel F/u in 6 months         Other   Tobacco abuse    Discussed risks of smoking and encourage cessation Will change Wellbutrin to XL 150 mg daily given difficulty with insomnia Failed chantix in the past Consider dose increase of wellbutrin      Hyperlipidemia    Previously well controlled Continue statin Repeat FLP and CMP annually      Insomnia    wellbutrin may be contributing - see above Discussed sleep hygeine Can try melatonin      Other Visit Diagnoses     Encounter for Medicare annual wellness exam    -  Primary   Encounter for annual physical exam       Tobacco use disorder       Need for pneumococcal vaccination       Relevant Orders   Pneumococcal conjugate vaccine 20-valent (Completed)   Postmenopausal       Relevant Orders   DG Bone Density   Encounter for smoking cessation counseling            Return in about 6 months (around 07/05/2022) for chronic disease f/u, With new PCP.     I, Lavon Paganini, MD, have reviewed all documentation for this visit. The documentation on 01/05/22 for the exam, diagnosis, procedures, and orders are all accurate and complete.   Travonna Swindle, Dionne Bucy, MD, MPH [facility]

## 2022-01-05 NOTE — Assessment & Plan Note (Signed)
Discussed risks of smoking and encourage cessation Will change Wellbutrin to XL 150 mg daily given difficulty with insomnia Failed chantix in the past Consider dose increase of wellbutrin

## 2022-01-06 DIAGNOSIS — I671 Cerebral aneurysm, nonruptured: Secondary | ICD-10-CM | POA: Diagnosis not present

## 2022-01-20 ENCOUNTER — Ambulatory Visit (INDEPENDENT_AMBULATORY_CARE_PROVIDER_SITE_OTHER): Payer: Medicare Other | Admitting: Physician Assistant

## 2022-01-20 ENCOUNTER — Encounter: Payer: Self-pay | Admitting: Physician Assistant

## 2022-01-20 ENCOUNTER — Other Ambulatory Visit: Payer: Self-pay

## 2022-01-20 ENCOUNTER — Ambulatory Visit: Payer: Self-pay | Admitting: *Deleted

## 2022-01-20 VITALS — BP 135/87 | HR 74 | Ht 70.0 in | Wt 198.1 lb

## 2022-01-20 DIAGNOSIS — I10 Essential (primary) hypertension: Secondary | ICD-10-CM

## 2022-01-20 DIAGNOSIS — J0110 Acute frontal sinusitis, unspecified: Secondary | ICD-10-CM

## 2022-01-20 MED ORDER — TRIAMTERENE-HCTZ 37.5-25 MG PO TABS: 1.0000 | ORAL_TABLET | Freq: Every day | ORAL | 1 refills | Status: DC

## 2022-01-20 NOTE — Telephone Encounter (Signed)
?  Chief Complaint: HTN ?Symptoms: high bp ?Frequency: multiple takes in two days ?Pertinent Negatives: Patient denies numbness, weakness ?Disposition: '[]'$ ED /'[]'$ Urgent Care (no appt availability in office) / '[x]'$ Appointment(In office/virtual)/ '[]'$  [place] [facility]/ '[]'$ Home Care/ '[]'$ Refused Recommended Disposition /'[]'$ [place] [facility]/ '[]'$  Follow-up with PCP ?Additional Notes: Having hypertension for few days, appt made for this afternoon. ? ? ?Reason for Disposition ? Systolic BP  >= 916 OR Diastolic >= 945 ? ?Answer Assessment - Initial Assessment Questions ?1. BLOOD PRESSURE: "What is the blood pressure?" "Did you take at least two measurements 5 minutes apart?" ?    yes ?2. ONSET: "When did you take your blood pressure?" ?    146/120, 149/108 ?3. HOW: "How did you obtain the blood pressure?" (e.g., visiting nurse, automatic home BP monitor) ?    Home machine ?4. HISTORY: "Do you have a history of high blood pressure?" ?    Yes, took this morning ?5. MEDICATIONS: "Are you taking any medications for blood pressure?" "Have you missed any doses recently?" ?    No, took extra dose last night ?6. OTHER SYMPTOMS: "Do you have any symptoms?" (e.g., headache, chest pain, blurred vision, difficulty breathing, weakness) ?    no ?7. PREGNANCY: "Is there any chance you are pregnant?" "When was your last menstrual period?" ?    na ? ?Protocols used: Blood Pressure - High-A-AH ? ?

## 2022-01-20 NOTE — Assessment & Plan Note (Addendum)
Pt was thought to be taking both Amlodipine 5 mg and Maxide daily.  ? ?Advised for the next  3 weeks to add the Maxide 37.5/25 back on a daily basis. Continue Amlodipine 5 mg daily. Continue checking BP at home.  ?Avoid Fioricet and take tylenol for pain control.  ?F/u 3 weeks ?

## 2022-01-20 NOTE — Assessment & Plan Note (Signed)
Likely viral < 10 days of symptoms ?Advised tylenol for pain , azelastine-fluticasone nasal spray, saline rinses.  ?Call office if symptoms persist > 10 days or if develop fever, chills, body aches ?

## 2022-01-20 NOTE — Progress Notes (Signed)
? ?I,[name],acting as a Education administrator for [employer], PA-C.,have documented all relevant documentation on the behalf of Elizabeth Kirschner, PA-C,as directed by  Elizabeth Kirschner, PA-C while in the presence of Elizabeth Kirschner, PA-C. ? ?Established Patient Office Visit ? ?Subjective:  ?Patient ID: Elizabeth Hogan, female    DOB: [birth year]  Age: 66 y.o. MRN: [id number] ? ?CC: elevated BP ? ? ?HPI ?[place] presents for concerns of elevated BP for the past few days. Reports 140s/120s. She typically only takes Amlodipine 5 mg daily, but has been taking her 'fluid pill' for the last few days. Reports for the last week feeling congested, with sinus drainage, headaches. She took one of her Fioricet a few days ago, along with tylenol. Denies any OTC nasal decongestants.  ? ?Still smoking, but less, only 2-3 cigarettes a day.  ? ?Denies fevers, chills, body aches.  ?Outside blood pressures are 146/120, 149/108, 134/95 (right before she came, [place]) ? ?---------------------------------------------------------------------------------------------------  ? ?Past Medical History:  ?Diagnosis Date  ? Anxiety   ? GERD (gastroesophageal reflux disease)   ? Hyperlipidemia   ? Hypertension   ? Seizures ([place])   ? in the 1970's.  none since.  ? Wears dentures   ? partial upper (loose)  ? ? ?Past Surgical History:  ?Procedure Laterality Date  ? ABDOMINAL HYSTERECTOMY  2005  ? still has cervix  ? CATARACT EXTRACTION W/PHACO Left 05/12/2019  ? Procedure: CATARACT EXTRACTION PHACO AND INTRAOCULAR LENS PLACEMENT (IOC) COMPLICATED  LEFT HEALON 5, VISION BLUE, SYMFONY LENS;  Surgeon: Leandrew Koyanagi, MD;  Location: [place];  Service: Ophthalmology;  Laterality: Left;  ? CATARACT EXTRACTION W/PHACO Right 06/07/2019  ? Procedure: CATARACT EXTRACTION PHACO AND INTRAOCULAR LENS PLACEMENT ([place])  COMPLICATED RIGHT;  Surgeon: Leandrew Koyanagi, MD;  Location: [place];  Service: Ophthalmology;  Laterality: Right;   MALYUGIN  ? COLONOSCOPY WITH PROPOFOL N/A 02/11/2016  ? Procedure: COLONOSCOPY WITH PROPOFOL;  Surgeon: Lucilla Lame, MD;  Location: [facility];  Service: Endoscopy;  Laterality: N/A;  ? LAMINECTOMY  07/2010  ? Secondary to Job injury in 2010, Subsequent nerve damage and followed by [facility]  ? TOOTH EXTRACTION    ? TUBAL LIGATION    ? ? ?Family History  ?Problem Relation Age of Onset  ? Hypertension Mother   ? Arthritis Mother   ? Gout Mother   ? Diabetes Father   ? Alzheimer's disease Father   ? Hypertension Father   ? Hypertension Brother   ? Breast cancer Cousin 27  ?     maternal side  ? ? ?Social History  ? ?Socioeconomic History  ? Marital status: Single  ?  Spouse name: Not on file  ? Number of children: 3  ? Years of education: Not on file  ? Highest education level: GED or equivalent  ?Occupational History  ? Occupation: disability  ?Tobacco Use  ? Smoking status: Every Day  ?  Packs/day: 0.50  ?  Years: 48.00  ?  Pack years: 24.00  ?  Types: Cigarettes  ? Smokeless tobacco: Never  ? Tobacco comments:  ?  pt declines quitting at this time  ?Vaping Use  ? Vaping Use: Never used  ?Substance and Sexual Activity  ? Alcohol use: Yes  ?  Alcohol/week: 14.0 - 21.0 standard drinks  ?  Types: 14 - 21 Cans of beer per week  ?  Comment: 2-3 beers a night  ? Drug use: Yes  ?  Types: Cocaine  ?  Comment: current use.  ? Sexual activity: Yes  ?  Birth control/protection: None  ?  Comment: 1 pack lasts pt 3-4 days  ?Other Topics Concern  ? Not on file  ?Social History Narrative  ? Patients son is deceased. Currently has 1 son and 1 daughter living.   ? ?Social Determinants of Health  ? ?Financial Resource Strain: Not on file  ?Food Insecurity: Not on file  ?Transportation Needs: Not on file  ?Physical Activity: Not on file  ?Stress: Not on file  ?Social Connections: Not on file  ?Intimate Partner Violence: Not on file  ? ? ?Outpatient Medications Prior to Visit  ?Medication Sig Dispense Refill  ? amLODipine (NORVASC) 5  MG tablet Take 1 tablet by mouth once daily 90 tablet 0  ? Azelastine-Fluticasone 137-50 MCG/ACT SUSP Place 1 spray into the nose every 12 (twelve) hours. 23 g 5  ? buPROPion (WELLBUTRIN XL) 150 MG 24 hr tablet Take 1 tablet (150 mg total) by mouth daily. 30 tablet 3  ? butalbital-acetaminophen-caffeine (FIORICET) 50-325-40 MG tablet Take 1 tablet by mouth every 6 (six) hours as needed for headache. 20 tablet 0  ? chlorzoxazone (PARAFON) 500 MG tablet TAKE 1 TABLET BY MOUTH EVERY 6 HOURS AS NEEDED FOR MUSCLE SPASM 120 tablet 0  ? gabapentin (NEURONTIN) 800 MG tablet TAKE 1 TABLET BY MOUTH THREE TIMES DAILY 270 tablet 0  ? loratadine (CLARITIN) 10 MG tablet Take 10 mg by mouth daily. Unsure which allergy medication is comparable to    ? methocarbamol (ROBAXIN) 500 MG tablet Take 1 tablet (500 mg total) by mouth every 6 (six) hours as needed (Neck pain). 20 tablet 0  ? naproxen (NAPROSYN) 500 MG tablet Take 1 tablet (500 mg total) by mouth 2 (two) times daily with a meal. 30 tablet 0  ? omeprazole (PRILOSEC) 20 MG capsule Take 1 capsule (20 mg total) by mouth daily. 90 capsule 1  ? predniSONE (DELTASONE) 50 MG tablet Take 1 tablet (50 mg total) by mouth daily with breakfast. 5 tablet 0  ? rosuvastatin (CRESTOR) 10 MG tablet Take 1 tablet (10 mg total) by mouth daily. 90 tablet 2  ? valACYclovir (VALTREX) 1000 MG tablet Take 1 tablet (1,000 mg total) by mouth 2 (two) times daily. 20 tablet 5  ? triamterene-hydrochlorothiazide (MAXZIDE-25) 37.5-25 MG tablet Take 1 tablet by mouth once daily 90 tablet 0  ? ?No facility-administered medications prior to visit.  ? ? ?Allergies  ?Allergen Reactions  ? Augmentin [Amoxicillin-Pot Clavulanate] Itching  ? Codeine Itching  ? Codeine Sulfate Itching and Rash  ? ? ?ROS ?Review of Systems  ?Constitutional:  Negative for fatigue and fever.  ?HENT:  Positive for congestion, postnasal drip, rhinorrhea, sinus pressure and sinus pain.   ?Respiratory:  Negative for cough and shortness of  breath.   ?Cardiovascular:  Negative for chest pain and leg swelling.  ?Gastrointestinal:  Negative for abdominal pain.  ?Neurological:  Negative for dizziness and headaches.  ? ?  ?Objective:  ?  ?Physical Exam ?Constitutional:   ?   Appearance: Normal appearance. She is not ill-appearing.  ?HENT:  ?   Head: Normocephalic.  ?   Right Ear: Tympanic membrane normal.  ?   Left Ear: Tympanic membrane normal.  ?   Nose: Congestion and rhinorrhea present.  ?   Mouth/Throat:  ?   Pharynx: Posterior oropharyngeal erythema present. No oropharyngeal exudate.  ?Eyes:  ?   Conjunctiva/sclera: Conjunctivae normal.  ?Cardiovascular:  ?   Rate and  Rhythm: Normal rate and regular rhythm.  ?   Pulses: Normal pulses.  ?   Heart sounds: Normal heart sounds.  ?Pulmonary:  ?   Effort: Pulmonary effort is normal.  ?   Breath sounds: Normal breath sounds.  ?Musculoskeletal:  ?   Right lower leg: No edema.  ?   Left lower leg: No edema.  ?Neurological:  ?   Mental Status: She is oriented to person, place, and time.  ?Psychiatric:     ?   Mood and Affect: Mood normal.     ?   Behavior: Behavior normal.  ? ? ?BP 135/87 (BP Location: Left Arm, Patient Position: Sitting, Cuff Size: Normal)   Pulse 74   Ht '5\' 10"'$  (1.778 m)   Wt 198 lb 1.6 oz (89.9 kg)   SpO2 100%   BMI 28.42 kg/m?  ?Wt Readings from Last 3 Encounters:  ?01/20/22 198 lb 1.6 oz (89.9 kg)  ?01/05/22 197 lb 4.8 oz (89.5 kg)  ?12/24/21 193 lb 2 oz (87.6 kg)  ? ? ? ?Health Maintenance Due  ?Topic Date Due  ? COVID-19 Vaccine (1) Never done  ? TETANUS/TDAP  Never done  ? Zoster Vaccines- Shingrix (1 of 2) Never done  ? DEXA SCAN  Never done  ? MAMMOGRAM  01/15/2022  ? ? ?There are no preventive care reminders to display for this patient. ? ?Lab Results  ?Component Value Date  ? TSH 2.480 07/17/2020  ? ?Lab Results  ?Component Value Date  ? WBC 4.0 12/24/2021  ? HGB 12.9 12/24/2021  ? HCT 40.0 12/24/2021  ? MCV 93.9 12/24/2021  ? PLT 266 12/24/2021  ? ?Lab Results  ?Component  Value Date  ? NA 140 12/24/2021  ? K 3.7 12/24/2021  ? CO2 28 12/24/2021  ? GLUCOSE 91 12/24/2021  ? BUN 13 12/24/2021  ? CREATININE 0.75 12/24/2021  ? BILITOT 0.3 10/16/2021  ? ALKPHOS 81 10/16/2021  ? AST 30 12

## 2022-02-10 ENCOUNTER — Ambulatory Visit: Payer: Medicare Other | Admitting: Physician Assistant

## 2022-02-11 ENCOUNTER — Ambulatory Visit (INDEPENDENT_AMBULATORY_CARE_PROVIDER_SITE_OTHER): Payer: Medicare Other | Admitting: Physician Assistant

## 2022-02-11 ENCOUNTER — Encounter: Payer: Self-pay | Admitting: Physician Assistant

## 2022-02-11 VITALS — BP 127/87 | HR 88 | Ht 70.0 in | Wt 195.6 lb

## 2022-02-11 DIAGNOSIS — I671 Cerebral aneurysm, nonruptured: Secondary | ICD-10-CM | POA: Diagnosis not present

## 2022-02-11 DIAGNOSIS — I10 Essential (primary) hypertension: Secondary | ICD-10-CM | POA: Diagnosis not present

## 2022-02-11 DIAGNOSIS — R519 Headache, unspecified: Secondary | ICD-10-CM | POA: Diagnosis not present

## 2022-02-11 DIAGNOSIS — J301 Allergic rhinitis due to pollen: Secondary | ICD-10-CM | POA: Diagnosis not present

## 2022-02-11 DIAGNOSIS — Z716 Tobacco abuse counseling: Secondary | ICD-10-CM

## 2022-02-11 MED ORDER — MOMETASONE FUROATE 50 MCG/ACT NA SUSP: 2.0000 | Freq: Every day | NASAL | 2 refills | Status: AC

## 2022-02-11 NOTE — Assessment & Plan Note (Signed)
Positional, tension headaches?  ?I do not think it is related to her small cerebral aneurysm but advised she mention at her next neuro appt  ?Amb ref ortho w/ caution secondary to aneurysm ? ? ?

## 2022-02-11 NOTE — Assessment & Plan Note (Signed)
Improved, continue amlodipine 5 mg and maxide 37.5/25 mg. ? ?

## 2022-02-11 NOTE — Progress Notes (Signed)
? ?I,[name],acting as a scribe for [employer], PA-C.,have documented all relevant documentation on the behalf of Elizabeth Kirschner, PA-C,as directed by  Elizabeth Kirschner, PA-C while in the presence of Elizabeth Kirschner, PA-C. ? ?Established Patient Office Visit ? ?Subjective:  ?Patient ID: Elizabeth Hogan, female    DOB: [birth year]  Age: 66 y.o. MRN: [id number] ? ?CC: HTN fu ? ? ?HPI ? ?Reports persistent nasal congestion, disliked the combination azelastine-flonase nasal spray as it made her sneeze. She has not started her Claritin yet this allergy season.  ? ?Persistent temporal headaches, positional, wakes up with them at night. Will take PRN tylenol. Still has right sided neck tightness/pain as well.  ? ?Hypertension, follow-up ? ?BP Readings from Last 3 Encounters:  ?02/11/22 127/87  ?01/20/22 135/87  ?01/05/22 120/76  ? Wt Readings from Last 3 Encounters:  ?02/11/22 195 lb 9.6 oz (88.7 kg)  ?01/20/22 198 lb 1.6 oz (89.9 kg)  ?01/05/22 197 lb 4.8 oz (89.5 kg)  ?  ? ?She was last seen for hypertension 3 weeks ago.  ?BP at that visit was 135/87. Management since that visit includes Advised for the next  3 weeks to add the Maxide 37.5/25 back on a daily basis. Continue Amlodipine 5 mg daily. Continue checking BP at home. Avoid Fioricet and take tylenol for pain control. ? ?She reports excellent compliance with treatment. ?She is not having side effects.  ?She is following a Regular diet. ?She is not exercising. ?She does smoke. ? ?Use of agents associated with hypertension:  tumeric .  ? ?Outside blood pressures are improved. ?Symptoms: ?No chest pain No chest pressure  ?No palpitations No syncope  ?No dyspnea No orthopnea  ?No paroxysmal nocturnal dyspnea No lower extremity edema  ? ?Pertinent labs ?Lab Results  ?Component Value Date  ? CHOL 177 10/16/2021  ? HDL 78 10/16/2021  ? [place] 81 10/16/2021  ? TRIG 103 10/16/2021  ? CHOLHDL 2.3 10/16/2021  ? Lab Results  ?Component Value Date  ? NA 140 12/24/2021  ?  K 3.7 12/24/2021  ? CREATININE 0.75 12/24/2021  ? GFRNONAA >60 12/24/2021  ? GLUCOSE 91 12/24/2021  ? TSH 2.480 07/17/2020  ?  ? ?The 10-year ASCVD risk score (Arnett DK, et al., 2019) is: 14.3% ? ?---------------------------------------------------------------------------------------------------  ? ?Past Medical History:  ?Diagnosis Date  ? Anxiety   ? GERD (gastroesophageal reflux disease)   ? Hyperlipidemia   ? Hypertension   ? Seizures ([place])   ? in the 1970's.  none since.  ? Wears dentures   ? partial upper (loose)  ? ? ?Past Surgical History:  ?Procedure Laterality Date  ? ABDOMINAL HYSTERECTOMY  2005  ? still has cervix  ? CATARACT EXTRACTION W/PHACO Left 05/12/2019  ? Procedure: CATARACT EXTRACTION PHACO AND INTRAOCULAR LENS PLACEMENT (IOC) COMPLICATED  LEFT HEALON 5, VISION BLUE, SYMFONY LENS;  Surgeon: Leandrew Koyanagi, MD;  Location: [place];  Service: Ophthalmology;  Laterality: Left;  ? CATARACT EXTRACTION W/PHACO Right 06/07/2019  ? Procedure: CATARACT EXTRACTION PHACO AND INTRAOCULAR LENS PLACEMENT ([place])  COMPLICATED RIGHT;  Surgeon: Leandrew Koyanagi, MD;  Location: [place];  Service: Ophthalmology;  Laterality: Right;  MALYUGIN  ? COLONOSCOPY WITH PROPOFOL N/A 02/11/2016  ? Procedure: COLONOSCOPY WITH PROPOFOL;  Surgeon: Lucilla Lame, MD;  Location: [facility];  Service: Endoscopy;  Laterality: N/A;  ? LAMINECTOMY  07/2010  ? Secondary to Job injury in 2010, Subsequent nerve damage and followed by [facility]  ? TOOTH EXTRACTION    ?  TUBAL LIGATION    ? ? ?Family History  ?Problem Relation Age of Onset  ? Hypertension Mother   ? Arthritis Mother   ? Gout Mother   ? Diabetes Father   ? Alzheimer's disease Father   ? Hypertension Father   ? Hypertension Brother   ? Breast cancer Cousin 38  ?     maternal side  ? ? ?Social History  ? ?Socioeconomic History  ? Marital status: Single  ?  Spouse name: Not on file  ? Number of children: 3  ? Years of education: Not on file  ?  Highest education level: GED or equivalent  ?Occupational History  ? Occupation: disability  ?Tobacco Use  ? Smoking status: Every Day  ?  Packs/day: 0.50  ?  Years: 48.00  ?  Pack years: 24.00  ?  Types: Cigarettes  ? Smokeless tobacco: Never  ? Tobacco comments:  ?  pt declines quitting at this time  ?Vaping Use  ? Vaping Use: Never used  ?Substance and Sexual Activity  ? Alcohol use: Yes  ?  Alcohol/week: 14.0 - 21.0 standard drinks  ?  Types: 14 - 21 Cans of beer per week  ?  Comment: 2-3 beers a night  ? Drug use: Yes  ?  Types: Cocaine  ?  Comment: current use.  ? Sexual activity: Yes  ?  Birth control/protection: None  ?  Comment: 1 pack lasts pt 3-4 days  ?Other Topics Concern  ? Not on file  ?Social History Narrative  ? Patients son is deceased. Currently has 1 son and 1 daughter living.   ? ?Social Determinants of Health  ? ?Financial Resource Strain: Not on file  ?Food Insecurity: Not on file  ?Transportation Needs: Not on file  ?Physical Activity: Not on file  ?Stress: Not on file  ?Social Connections: Not on file  ?Intimate Partner Violence: Not on file  ? ? ?Outpatient Medications Prior to Visit  ?Medication Sig Dispense Refill  ? amLODipine (NORVASC) 5 MG tablet Take 1 tablet by mouth once daily 90 tablet 0  ? buPROPion (WELLBUTRIN XL) 150 MG 24 hr tablet Take 1 tablet (150 mg total) by mouth daily. 30 tablet 3  ? butalbital-acetaminophen-caffeine (FIORICET) 50-325-40 MG tablet Take 1 tablet by mouth every 6 (six) hours as needed for headache. 20 tablet 0  ? chlorzoxazone (PARAFON) 500 MG tablet TAKE 1 TABLET BY MOUTH EVERY 6 HOURS AS NEEDED FOR MUSCLE SPASM 120 tablet 0  ? gabapentin (NEURONTIN) 800 MG tablet TAKE 1 TABLET BY MOUTH THREE TIMES DAILY 270 tablet 0  ? loratadine (CLARITIN) 10 MG tablet Take 10 mg by mouth daily. Unsure which allergy medication is comparable to    ? methocarbamol (ROBAXIN) 500 MG tablet Take 1 tablet (500 mg total) by mouth every 6 (six) hours as needed (Neck pain). 20  tablet 0  ? naproxen (NAPROSYN) 500 MG tablet Take 1 tablet (500 mg total) by mouth 2 (two) times daily with a meal. 30 tablet 0  ? omeprazole (PRILOSEC) 20 MG capsule Take 1 capsule (20 mg total) by mouth daily. 90 capsule 1  ? rosuvastatin (CRESTOR) 10 MG tablet Take 1 tablet (10 mg total) by mouth daily. 90 tablet 2  ? triamterene-hydrochlorothiazide (MAXZIDE-25) 37.5-25 MG tablet Take 1 tablet by mouth daily. 90 tablet 1  ? valACYclovir (VALTREX) 1000 MG tablet Take 1 tablet (1,000 mg total) by mouth 2 (two) times daily. 20 tablet 5  ? Azelastine-Fluticasone 137-50 MCG/ACT SUSP  Place 1 spray into the nose every 12 (twelve) hours. 23 g 5  ? predniSONE (DELTASONE) 50 MG tablet Take 1 tablet (50 mg total) by mouth daily with breakfast. 5 tablet 0  ? ?No facility-administered medications prior to visit.  ? ? ?Allergies  ?Allergen Reactions  ? Augmentin [Amoxicillin-Pot Clavulanate] Itching  ? Codeine Itching  ? Codeine Sulfate Itching and Rash  ? ? ?ROS ?Review of Systems  ?Constitutional:  Negative for fatigue and fever.  ?Respiratory:  Negative for cough and shortness of breath.   ?Cardiovascular:  Negative for chest pain and leg swelling.  ?Gastrointestinal:  Negative for abdominal pain.  ?Neurological:  Negative for dizziness and headaches.  ? ?  ?Objective:  ?  ?Physical Exam ?Constitutional:   ?   Appearance: Normal appearance. She is not ill-appearing.  ?HENT:  ?   Head: Normocephalic.  ?Eyes:  ?   Conjunctiva/sclera: Conjunctivae normal.  ?Cardiovascular:  ?   Rate and Rhythm: Normal rate and regular rhythm.  ?   Pulses: Normal pulses.  ?   Heart sounds: Normal heart sounds.  ?Pulmonary:  ?   Effort: Pulmonary effort is normal.  ?   Breath sounds: Normal breath sounds.  ?Musculoskeletal:  ?   Right lower leg: No edema.  ?   Left lower leg: No edema.  ?Neurological:  ?   Mental Status: She is oriented to person, place, and time.  ?Psychiatric:     ?   Mood and Affect: Mood normal.     ?   Behavior: Behavior  normal.  ? ? ?BP 127/87 (BP Location: Right Arm, Patient Position: Sitting, Cuff Size: Normal)   Pulse 88   Ht '5\' 10"'$  (1.778 m)   Wt 195 lb 9.6 oz (88.7 kg)   SpO2 98%   BMI 28.07 kg/m?  ?Wt Readings from

## 2022-02-11 NOTE — Assessment & Plan Note (Signed)
Start Claritin again at night. ?Switched azelastine/flonase to mometasone nasal spray.  ?Can use BID if she finds improvement. ?Continue saline spray ?

## 2022-02-11 NOTE — Assessment & Plan Note (Addendum)
Continues to cut down. She is managing smoking behaviors, distracting herself and physically distracting her hands when she has the urge to smoke ?Continue Wellbutrin ?

## 2022-02-13 ENCOUNTER — Other Ambulatory Visit (HOSPITAL_COMMUNITY): Payer: Self-pay | Admitting: Neurosurgery

## 2022-02-13 ENCOUNTER — Other Ambulatory Visit: Payer: Self-pay | Admitting: Neurosurgery

## 2022-02-13 DIAGNOSIS — I671 Cerebral aneurysm, nonruptured: Secondary | ICD-10-CM

## 2022-02-15 ENCOUNTER — Other Ambulatory Visit: Payer: Self-pay | Admitting: Physician Assistant

## 2022-02-15 DIAGNOSIS — G8929 Other chronic pain: Secondary | ICD-10-CM

## 2022-02-18 ENCOUNTER — Encounter: Payer: Self-pay | Admitting: Family Medicine

## 2022-03-12 ENCOUNTER — Other Ambulatory Visit: Payer: Self-pay | Admitting: Family Medicine

## 2022-03-12 ENCOUNTER — Other Ambulatory Visit: Payer: Self-pay | Admitting: Physician Assistant

## 2022-03-12 DIAGNOSIS — G8929 Other chronic pain: Secondary | ICD-10-CM

## 2022-03-12 DIAGNOSIS — I10 Essential (primary) hypertension: Secondary | ICD-10-CM

## 2022-03-12 DIAGNOSIS — E785 Hyperlipidemia, unspecified: Secondary | ICD-10-CM

## 2022-03-12 NOTE — Telephone Encounter (Signed)
Requested Prescriptions  ?Pending Prescriptions Disp Refills  ?? rosuvastatin (CRESTOR) 10 MG tablet [Pharmacy Med Name: Rosuvastatin Calcium 10 MG Oral Tablet] 90 tablet 0  ?  Sig: Take 1 tablet by mouth once daily  ?  ? Cardiovascular:  Antilipid - Statins 2 Failed - 03/12/2022  2:59 PM  ?  ?  Failed - Lipid Panel in normal range within the last 12 months  ?  Cholesterol, Total  ?Date Value Ref Range Status  ?10/16/2021 177 100 - 199 mg/dL Final  ? ?LDL Chol Calc (NIH)  ?Date Value Ref Range Status  ?10/16/2021 81 0 - 99 mg/dL Final  ? ?HDL  ?Date Value Ref Range Status  ?10/16/2021 78 >39 mg/dL Final  ? ?Triglycerides  ?Date Value Ref Range Status  ?10/16/2021 103 0 - 149 mg/dL Final  ? ?  ?  ?  Passed - Cr in normal range and within 360 days  ?  Creatinine, Ser  ?Date Value Ref Range Status  ?12/24/2021 0.75 0.44 - 1.00 mg/dL Final  ?   ?  ?  Passed - Patient is not pregnant  ?  ?  Passed - Valid encounter within last 12 months  ?  Recent Outpatient Visits   ?      ? 4 weeks ago Essential (primary) hypertension  ? [facility] Thedore Mins, [place], PA-C  ? 1 month ago Essential (primary) hypertension  ? [facility] [name], PA-C  ? 2 months ago Encounter for [employer] annual wellness exam  ? [facility] [place], Dionne Bucy, MD  ? 2 months ago Temporal headache  ? [place], DO  ? 4 months ago Nasal congestion  ? [facility] Thedore Mins, [place], PA-C  ?  ?  ?Future Appointments   ?        ? In 2 months Drubel, Ria Comment, PA-C [facility], [place]  ? In 4 months Drubel, Delman Cheadle [facility], [facility]  ?  ? ?  ?  ?  ? ?

## 2022-03-13 ENCOUNTER — Ambulatory Visit: Payer: Medicare Other

## 2022-03-19 ENCOUNTER — Ambulatory Visit
Admission: RE | Admit: 2022-03-19 | Discharge: 2022-03-19 | Disposition: A | Discharge status: Home / Self Care | Payer: Medicare Other | Source: Ambulatory Visit | Attending: Neurosurgery | Admitting: Neurosurgery

## 2022-03-19 DIAGNOSIS — I671 Cerebral aneurysm, nonruptured: Secondary | ICD-10-CM | POA: Diagnosis present

## 2022-03-24 ENCOUNTER — Ambulatory Visit (INDEPENDENT_AMBULATORY_CARE_PROVIDER_SITE_OTHER): Payer: Medicare Other | Admitting: Physician Assistant

## 2022-03-24 ENCOUNTER — Encounter: Payer: Self-pay | Admitting: Physician Assistant

## 2022-03-24 VITALS — BP 118/80 | HR 76 | Temp 98.1°F | Resp 16 | Ht 70.0 in | Wt 196.5 lb

## 2022-03-24 DIAGNOSIS — J0100 Acute maxillary sinusitis, unspecified: Secondary | ICD-10-CM

## 2022-03-24 MED ORDER — CEFPODOXIME PROXETIL 200 MG PO TABS: 200.0000 mg | ORAL_TABLET | Freq: Two times a day (BID) | ORAL | 0 refills | Status: AC

## 2022-03-24 NOTE — Patient Instructions (Signed)
Based on your symptoms and how long they have been bothering you I am sending in a script for an antibiotic  ?These typically resolve with antibiotic therapy along with at-home comfort measures  ?Today I have sent in a prescription for  ? Cefpodoxime 200 mg to be taken by mouth twice per day for 7 days  ?FINISH THE ENTIRE COURSE unless you develop an allergic reaction or are instructed to discontinue. ? ?I recommend stopping your Claritin and taking Zyrtec instead every day to assist with your allergies ? ?You can continue to take over the counter Dayquil, Mucinex as needed to assist with your symptoms  ? ?It can take a few days for the antibiotic to kick in so I recommend symptomatic relief with over the counter medication such as the following: ?Dayquil/ Nyquil ?Theraflu ?Alkaseltzer  ?Coricidin - if you have high blood pressure even if it is well managed with medications ? ?These medications typically have Tylenol in them already so you can take Ibuprofen as needed for further pain/ discomfort and fever management/ do not need to supplement with more outside of those medications ? ?Stay well hydrated with at least 75 oz of water per day to help with recovery ? ?If you notice any of the following please let us know: increased fever not responding to Tylenol or Ibuprofen, swelling around your nose or eyes, difficulty seeing,  ? ?

## 2022-03-24 NOTE — Progress Notes (Signed)
?  ? ? ?Established patient visit ? ? ?Patient: Elizabeth Hogan   DOB: August 10, 1956   66 y.o. Female  MRN: [id number] ?Visit Date: 03/24/2022 ? ?Today's healthcare provider: Dani Gobble Shandrell Boda, PA-C  ?Introduced myself to the patient as a Journalist, newspaper and provided education on APPs in clinical practice.  ? ? ?I,Tiffany J Bragg,acting as a Education administrator for [employer], PA-C.,have documented all relevant documentation on the behalf of Dasiah Hooley E Skyrah Krupp, PA-C,as directed by  [employer], PA-C while in the presence of Robby Pirani E Natasha Paulson, PA-C. ? ?Chief Complaint  ?Patient presents with  ? URI  ? ?Subjective  ?  ?HPI ?HPI   ? ? URI   ?Associated symptoms inlclude congestion, cough and rhinorrhea.  Recent episode started 1 to 4 weeks ago.  The problem has been unchanged since onset.  The temperature has been with in normal range.  Patient  is drinking plenty of fluids.  Past hisotry is significant for  occational episodes of bronchitis. ? ?  ?  ?Last edited by Smitty Knudsen, CMA on 03/24/2022  2:43 PM.  ?  ? ? ? ?------------------------------------------------------------------------------------------------  ? ? ?Reports she is having a lot of sinus congestion with bright green/yellow mucus ?States she is having a lot of rhinorrhea.  ?She has been taking cold medicine- Dayquil and Mucinex, Coricidin ?Reports this has been ongoing for 2 weeks ?Reports frontal sinus pressure when she lays down for bedtime  ?Thinks her symptoms have gotten worse since it started  ?She reports she is taking Claritin  ? ?Medications: ?Outpatient Medications Prior to Visit  ?Medication Sig  ? amLODipine (NORVASC) 5 MG tablet Take 1 tablet by mouth once daily  ? buPROPion (WELLBUTRIN XL) 150 MG 24 hr tablet Take 1 tablet (150 mg total) by mouth daily.  ? butalbital-acetaminophen-caffeine (FIORICET) 50-325-40 MG tablet Take 1 tablet by mouth every 6 (six) hours as needed for headache.  ? chlorzoxazone (PARAFON) 500 MG tablet TAKE 1 TABLET BY MOUTH EVERY 6 HOURS AS NEEDED FOR  MUSCLE SPASM  ? gabapentin (NEURONTIN) 800 MG tablet TAKE 1 TABLET BY MOUTH THREE TIMES DAILY  ? loratadine (CLARITIN) 10 MG tablet Take 10 mg by mouth daily. Unsure which allergy medication is comparable to  ? methocarbamol (ROBAXIN) 500 MG tablet Take 1 tablet (500 mg total) by mouth every 6 (six) hours as needed (Neck pain).  ? mometasone (NASONEX) 50 MCG/ACT nasal spray Place 2 sprays into the nose daily.  ? naproxen (NAPROSYN) 500 MG tablet Take 1 tablet (500 mg total) by mouth 2 (two) times daily with a meal.  ? omeprazole (PRILOSEC) 20 MG capsule Take 1 capsule (20 mg total) by mouth daily.  ? rosuvastatin (CRESTOR) 10 MG tablet Take 1 tablet by mouth once daily  ? triamterene-hydrochlorothiazide (MAXZIDE-25) 37.5-25 MG tablet Take 1 tablet by mouth daily.  ? valACYclovir (VALTREX) 1000 MG tablet Take 1 tablet (1,000 mg total) by mouth 2 (two) times daily.  ? ?No facility-administered medications prior to visit.  ? ? ?Review of Systems  ?Constitutional:  Negative for chills, diaphoresis, fatigue and fever.  ?HENT:  Positive for congestion, postnasal drip, rhinorrhea and sinus pain. Negative for ear pain, sinus pressure and sore throat.   ?Respiratory:  Negative for cough, shortness of breath and wheezing.   ?Gastrointestinal:  Negative for diarrhea, nausea and vomiting.  ?Neurological:  Negative for dizziness, light-headedness and headaches.  ? ? ?  Objective  ?  ?There were no vitals taken for this  visit. ? ? ?Physical Exam ?Vitals reviewed.  ?Constitutional:   ?   Appearance: Normal appearance.  ?HENT:  ?   Head: Normocephalic and atraumatic.  ?Neurological:  ?   Mental Status: She is alert.  ?  ? ? ?No results found for any visits on 03/24/22. ? Assessment & Plan  ?  ? ?Problem List Items Addressed This Visit   ?None ?Visit Diagnoses   ? ? Acute maxillary sinusitis, recurrence not specified    -  Primary ?Acute, recurrent problem ?States she has had symptoms for about 2 weeks that are not responding to OTC  medications ?Given duration and lack of improvement will send in script for Cefpodoxime 200 mg PO BID x 7 days ?Recommend she stop Claritin and switch to Zyrtec to assist with sinus symptoms from allergies ?Recommend she continue OTC medications while abx are kicking in  ?Reviewed precautions regarding cross-reaction with cephalosporins since she has augmentin allergy  ?Follow up as needed for persistent or progressing symptoms ?  ? Relevant Medications  ? cefpodoxime (VANTIN) 200 MG tablet  ? ?  ? ? ? ?No follow-ups on file. ? ? ?I, Darrik Richman E Raheel Kunkle, PA-C, have reviewed all documentation for this visit. The documentation on 03/24/22 for the exam, diagnosis, procedures, and orders are all accurate and complete. ? ? ?Faye Sanfilippo, MHS, PA-C ?[facility] ?[facility]  ? ?

## 2022-04-02 ENCOUNTER — Other Ambulatory Visit: Payer: Self-pay | Admitting: Physician Assistant

## 2022-04-02 DIAGNOSIS — G6289 Other specified polyneuropathies: Secondary | ICD-10-CM

## 2022-04-07 ENCOUNTER — Ambulatory Visit: Payer: Self-pay | Admitting: *Deleted

## 2022-04-07 NOTE — Progress Notes (Unsigned)
    I,Elizabeth Hogan,acting as a Education administrator for [employer], PA-C.,have documented all relevant documentation on the behalf of Elizabeth Kirschner, PA-C,as directed by  Elizabeth Kirschner, PA-C while in the presence of Elizabeth Kirschner, PA-C.   Acute Office Visit  Subjective:     Patient ID: Elizabeth Hogan, female    DOB: Mar 26, 1956, 66 y.o.   MRN: [id number] Cc. Nsaal congestion, mucous, PND  Elizabeth Hogan is a 66 y/o female who presents today with continues nasal congestion, post nasal drip, occasional headaches. She was seen 03/24/2022 and given cefpodoxime x 7 days. Reports no improvement of her symptoms. Denies fevers, chills. Denies using nasal sprays as they 'cant get in there'. Requesting amoxicillin.   Review of Systems  Constitutional:  Positive for malaise/fatigue.  HENT:  Positive for congestion.   Respiratory:  Negative for cough and shortness of breath.   Cardiovascular:  Negative for chest pain and leg swelling.  Neurological:  Negative for dizziness and headaches.       Objective:    Blood pressure (!) 123/102, pulse 77, height '5\' 10"'$  (1.778 m), weight 197 lb 3.2 oz (89.4 kg), SpO2 99 %.   Physical Exam Constitutional:      General: She is awake.     Appearance: She is well-developed.  HENT:     Head: Normocephalic.     Right Ear: Tympanic membrane normal.     Left Ear: Tympanic membrane normal.     Nose: Congestion and rhinorrhea present.     Mouth/Throat:     Pharynx: Posterior oropharyngeal erythema present. No oropharyngeal exudate.  Eyes:     Conjunctiva/sclera: Conjunctivae normal.  Cardiovascular:     Rate and Rhythm: Normal rate and regular rhythm.     Heart sounds: Normal heart sounds.  Pulmonary:     Effort: Pulmonary effort is normal.     Breath sounds: Normal breath sounds.  Skin:    General: Skin is warm.  Neurological:     Mental Status: She is alert and oriented to person, place, and time.  Psychiatric:        Attention and Perception: Attention normal.         Mood and Affect: Mood normal.        Speech: Speech normal.        Behavior: Behavior is cooperative.    No results found for any visits on 04/08/22.      Assessment & Plan:   Problem List Items Addressed This Visit       Cardiovascular and Mediastinum   Essential (primary) hypertension    Elevated today may be secondary to otc meds vs feeling unwell Advised pt to check at home over the next week and call office if continues to be elevated         Respiratory   Acute frontal sinusitis - Primary    At this point likely allergic, finished course of antibiotic. Pt requesting amoxicillin but on allergy list, h/o rash rx medrol dose back, encouraged nasal sprays, saline, antihistamine. Advised d/c all decongestants OTC d/t elevated BP       Relevant Medications   methylPREDNISolone (MEDROL DOSEPAK) 4 MG TBPK tablet    I, Elizabeth Kirschner, PA-C have reviewed all documentation for this visit. The documentation on  04/08/2022  for the exam, diagnosis, procedures, and orders are all accurate and complete.  Elizabeth Kirschner, PA-C [facility] [address] [place], [place], [address] Office: [phone] Fax: [phone]

## 2022-04-07 NOTE — Telephone Encounter (Signed)
Message from [place] sent at 04/07/2022 11:12 AM EDT  Summary: coughing up green stuff   PT called in states still coughing up green stuff since her appt on June 16 and she is asking can she be sent something stronger Please call back [facility] [place] [place]  Phone: [phone]  Fax: [phone]          Reason for Disposition  [1] Nasal discharge AND [2] present > 10 days  Answer Assessment - Initial Assessment Questions 1. ONSET: "When did the cough begin?"      Yellow mucus I'm coughing up and blowing my nose a lot.   2. SEVERITY: "How bad is the cough today?"      Cough not so bad.   I'm spitting and blowing out thick yellow mucus. 3. SPUTUM: "Describe the color of your sputum" (none, dry cough; clear, white, yellow, green)     Yellow and thick 4. HEMOPTYSIS: "Are you coughing up any blood?" If so ask: "How much?" (flecks, streaks, tablespoons, etc.)     *No Answer* 5. DIFFICULTY BREATHING: "Are you having difficulty breathing?" If Yes, ask: "How bad is it?" (e.g., mild, moderate, severe)    - MILD: No SOB at rest, mild SOB with walking, speaks normally in sentences, can lie down, no retractions, pulse < 100.    - MODERATE: SOB at rest, SOB with minimal exertion and prefers to sit, cannot lie down flat, speaks in phrases, mild retractions, audible wheezing, pulse 100-120.    - SEVERE: Very SOB at rest, speaks in single words, struggling to breathe, sitting hunched forward, retractions, pulse > 120       6. FEVER: "Do you have a fever?" If Yes, ask: "What is your temperature, how was it measured, and when did it start?"     No fever 7. CARDIAC HISTORY: "Do you have any history of heart disease?" (e.g., heart attack, congestive heart failure)      N/A 10. OTHER SYMPTOMS: "Do you have any other symptoms?" (e.g., runny nose, wheezing, chest pain)       N/A 11. PREGNANCY: "Is there any chance you are pregnant?" "When was  your last menstrual period?"       N/A 12. TRAVEL: "Have you traveled out of the country in the last month?" (e.g., travel history, exposures)       N/A  Protocols used: Cough - Acute Productive-A-AH

## 2022-04-07 NOTE — Telephone Encounter (Signed)
  Chief Complaint: Large amount of yellow mucus in sinuses and that she is coughing up.   (Seen by Talitha Givens, PA on 03/24/2022).   Sinus pressure too.  Requesting Amoxicillin.   That clears up her sinus infections Symptoms: sinus pressure, thick copious yellow mucus.  Mild cough mostly spitting up and blowing from her nose thick yellow mucus.    Not any better after taking the antibiotic. Frequency: No change after taking antibiotic Pertinent Negatives: Patient denies fever, body aches or chills. Disposition: '[]'$ ED /'[]'$ Urgent Care (no appt availability in office) / '[]'$ Appointment(In office/virtual)/ '[]'$  [facility]/ '[]'$ Home Care/ '[]'$ Refused Recommended Disposition /'[]'$ [facility]/ '[x]'$  Follow-up with PCP Additional Notes: I've sent her request for Amoxicillin to [name], PA-C.   Pt agreeable to someone calling her back.

## 2022-04-08 ENCOUNTER — Encounter: Payer: Self-pay | Admitting: Physician Assistant

## 2022-04-08 ENCOUNTER — Ambulatory Visit (INDEPENDENT_AMBULATORY_CARE_PROVIDER_SITE_OTHER): Payer: Medicare Other | Admitting: Physician Assistant

## 2022-04-08 VITALS — BP 123/102 | HR 77 | Ht 70.0 in | Wt 197.2 lb

## 2022-04-08 DIAGNOSIS — I10 Essential (primary) hypertension: Secondary | ICD-10-CM | POA: Diagnosis not present

## 2022-04-08 DIAGNOSIS — J0111 Acute recurrent frontal sinusitis: Secondary | ICD-10-CM

## 2022-04-08 MED ORDER — METHYLPREDNISOLONE 4 MG PO TBPK
ORAL_TABLET | ORAL | 0 refills | Status: AC
Start: 2022-04-08 — End: ?

## 2022-04-08 NOTE — Assessment & Plan Note (Addendum)
Elevated today may be secondary to otc meds vs feeling unwell Advised pt to check at home over the next week and call office if continues to be elevated

## 2022-04-08 NOTE — Assessment & Plan Note (Addendum)
At this point likely allergic, finished course of antibiotic. Pt requesting amoxicillin but on allergy list, h/o rash rx medrol dose back, encouraged nasal sprays, saline, antihistamine. Advised d/c all decongestants OTC d/t elevated BP

## 2022-04-09 ENCOUNTER — Ambulatory Visit: Payer: Self-pay | Admitting: *Deleted

## 2022-04-09 NOTE — Telephone Encounter (Signed)
Reason for Disposition  [1] Systolic BP  >= 884 OR Diastolic >= 166 AND [0] having NO cardiac or neurologic symptoms  Answer Assessment - Initial Assessment Questions 1. BLOOD PRESSURE: "What is the blood pressure?" "Did you take at least two measurements 5 minutes apart?"     148/127, 127/88 2. ONSET: "When did you take your blood pressure?"     9 am, 1;30 pm 3. HOW: "How did you obtain the blood pressure?" (e.g., visiting nurse, automatic home BP monitor)     Automatic- arm 4. HISTORY: "Do you have a history of high blood pressure?"     yes 5. MEDICATIONS: "Are you taking any medications for blood pressure?" "Have you missed any doses recently?"     No changes in dosing- patient took extra BP pill today 6. OTHER SYMPTOMS: "Do you have any symptoms?" (e.g., headache, chest pain, blurred vision, difficulty breathing, weakness)     headache 7. PREGNANCY: "Is there any chance you are pregnant?" "When was your last menstrual period?"  Protocols used: Blood Pressure - High-A-AH

## 2022-04-09 NOTE — Telephone Encounter (Signed)
  Chief Complaint: elevated BP- patient BP was high in office yesterday and is high today- patient thinks the steriod medication has made her BP high- patient took extra BP pill and fluid pill this morning to bring BP down. Patient wonders if she should be on a different BP medication- advised would be preferable to wait to change anything until she is finished with course of treatment. Will advised PCP of patient concerns and see what she recommends for now and for later. Symptoms: headache, elevated BP Frequency: yesterday and today Pertinent Negatives: Patient denies chest pain, blurred vision, difficulty breathing, weakness Disposition: '[]'$ ED /'[]'$ Urgent Care (no appt availability in office) / '[]'$ Appointment(In office/virtual)/ '[]'$  [facility]/ '[]'$ Home Care/ '[]'$ Refused Recommended Disposition /'[]'$ [facility]/ '[x]'$  Follow-up with PCP Additional Notes: Patient wants to discuss changing  her BP medictaion

## 2022-04-21 ENCOUNTER — Other Ambulatory Visit: Payer: Self-pay | Admitting: Physician Assistant

## 2022-04-21 DIAGNOSIS — K219 Gastro-esophageal reflux disease without esophagitis: Secondary | ICD-10-CM

## 2022-04-22 NOTE — Telephone Encounter (Signed)
Requested Prescriptions  Pending Prescriptions Disp Refills  . omeprazole (PRILOSEC) 20 MG capsule [Pharmacy Med Name: Omeprazole 20 MG Oral Capsule Delayed Release] 90 capsule 0    Sig: Take 1 capsule by mouth once daily     Gastroenterology: Proton Pump Inhibitors Passed - 04/21/2022  6:04 PM      Passed - Valid encounter within last 12 months    Recent Outpatient Visits          2 weeks ago Acute recurrent frontal sinusitis   [facility] [name], PA-C   4 weeks ago Acute maxillary sinusitis, recurrence not specified   [employer], Dani Gobble, PA-C   2 months ago Essential (primary) hypertension   [employer], [place], PA-C   3 months ago Essential (primary) hypertension   [employer], [place], PA-C   3 months ago Encounter for [employer] annual wellness exam   [employer], Dionne Bucy, MD      Future Appointments            In 3 weeks Thedore Mins, Ria Comment, PA-C [facility], [place]   In 2 months Thedore Mins, Ria Comment, PA-C [employer] [facility], [place]

## 2022-05-04 ENCOUNTER — Other Ambulatory Visit: Payer: Self-pay | Admitting: Physician Assistant

## 2022-05-04 DIAGNOSIS — G8929 Other chronic pain: Secondary | ICD-10-CM

## 2022-05-12 ENCOUNTER — Other Ambulatory Visit: Payer: Self-pay | Admitting: Family Medicine

## 2022-05-13 ENCOUNTER — Ambulatory Visit: Payer: Medicare Other | Admitting: Physician Assistant

## 2022-05-15 ENCOUNTER — Ambulatory Visit: Payer: Medicare Other | Admitting: Physician Assistant

## 2022-05-15 NOTE — Progress Notes (Deleted)
I,Elizabeth Hogan,acting as a Education administrator for [employer], Elizabeth Hogan.,have documented all relevant documentation on the behalf of Elizabeth Kirschner, Elizabeth Hogan,as directed by  Elizabeth Kirschner, Elizabeth Hogan while in the presence of Elizabeth Kirschner, Elizabeth Hogan.   Established patient visit   Patient: Elizabeth Hogan   DOB: 09-30-1956   66 y.o. Female  MRN: [id number] Visit Date: 05/15/2022  Today's healthcare provider: [name], Elizabeth Hogan   No chief complaint on file.  Subjective    HPI  Follow up for elevated blood pressure  The patient was last seen for this 5 weeks ago. Changes made at last visit include advised to check at home.  She reports {excellent/good/fair/poor:19665} compliance with treatment. She feels that condition is {improved/worse/unchanged:3041574}. She {is/is not:21021397} having side effects. ***  -----------------------------------------------------------------------------------------   Medications: Outpatient Medications Prior to Visit  Medication Sig   amLODipine (NORVASC) 5 MG tablet Take 1 tablet by mouth once daily   buPROPion (WELLBUTRIN XL) 150 MG 24 hr tablet Take 1 tablet (150 mg total) by mouth daily.   butalbital-acetaminophen-caffeine (FIORICET) 50-325-40 MG tablet Take 1 tablet by mouth every 6 (six) hours as needed for headache.   chlorzoxazone (PARAFON) 500 MG tablet TAKE 1 TABLET BY MOUTH EVERY 6 HOURS AS NEEDED FOR MUSCLE SPASM   gabapentin (NEURONTIN) 800 MG tablet TAKE 1 TABLET BY MOUTH THREE TIMES DAILY   loratadine (CLARITIN) 10 MG tablet Take 10 mg by mouth daily. Unsure which allergy medication is comparable to   methocarbamol (ROBAXIN) 500 MG tablet Take 1 tablet (500 mg total) by mouth every 6 (six) hours as needed (Neck pain).   methylPREDNISolone (MEDROL DOSEPAK) 4 MG TBPK tablet Take 6 pills on day 1, 5 pills on day 2, 4 pills on day 3, 3 pills on day 4, 2 pills on day 5, 1 pill on day 6   mometasone (NASONEX) 50 MCG/ACT nasal spray Place 2 sprays into the  nose daily.   naproxen (NAPROSYN) 500 MG tablet Take 1 tablet (500 mg total) by mouth 2 (two) times daily with a meal.   omeprazole (PRILOSEC) 20 MG capsule Take 1 capsule by mouth once daily   rosuvastatin (CRESTOR) 10 MG tablet Take 1 tablet by mouth once daily   triamterene-hydrochlorothiazide (MAXZIDE-25) 37.5-25 MG tablet Take 1 tablet by mouth daily.   valACYclovir (VALTREX) 1000 MG tablet Take 1 tablet (1,000 mg total) by mouth 2 (two) times daily.   No facility-administered medications prior to visit.    Review of Systems  Constitutional:  Negative for appetite change and fatigue.  Respiratory:  Negative for chest tightness and shortness of breath.   Cardiovascular:  Negative for chest pain, palpitations and leg swelling.    Last CBC Lab Results  Component Value Date   WBC 4.0 12/24/2021   HGB 12.9 12/24/2021   HCT 40.0 12/24/2021   MCV 93.9 12/24/2021   MCH 30.3 12/24/2021   RDW 14.4 12/24/2021   PLT 266 92/09/9416   Last metabolic panel Lab Results  Component Value Date   GLUCOSE 91 12/24/2021   NA 140 12/24/2021   K 3.7 12/24/2021   CL 106 12/24/2021   CO2 28 12/24/2021   BUN 13 12/24/2021   CREATININE 0.75 12/24/2021   GFRNONAA >60 12/24/2021   CALCIUM 9.5 12/24/2021   PROT 7.5 10/16/2021   ALBUMIN 4.8 10/16/2021   LABGLOB 2.7 10/16/2021   AGRATIO 1.8 10/16/2021   BILITOT 0.3 10/16/2021   ALKPHOS 81 10/16/2021   AST 30 10/16/2021  ALT 22 10/16/2021   ANIONGAP 6 12/24/2021       Objective    There were no vitals taken for this visit. BP Readings from Last 3 Encounters:  04/08/22 (!) 123/102  03/24/22 118/80  02/11/22 127/87   Wt Readings from Last 3 Encounters:  04/08/22 197 lb 3.2 oz (89.4 kg)  03/24/22 196 lb 8 oz (89.1 kg)  02/11/22 195 lb 9.6 oz (88.7 kg)      Physical Exam  ***  No results found for any visits on 05/15/22.  Assessment & Plan     ***  No follow-ups on file.      {provider attestation***:1}   Elizabeth Kirschner, Elizabeth Hogan  [facility] [phone] (phone) [phone] (fax)  [place]

## 2022-05-21 NOTE — Progress Notes (Deleted)
I,Sha'taria Jannell Franta,acting as a Education administrator for [employer], PA-C.,have documented all relevant documentation on the behalf of [name], PA-C,as directed by  [name], PA-C while in the presence of [name], PA-C.   Established patient visit   Patient: Elizabeth Hogan   DOB: [birth year]   66 y.o. Female  MRN: [id number] Visit Date: 05/22/2022  Today's healthcare provider: [name], PA-C   No chief complaint on file.  Subjective    HPI  Hypertension, follow-up  BP Readings from Last 3 Encounters:  04/08/22 (!) 123/102  03/24/22 118/80  02/11/22 127/87   Wt Readings from Last 3 Encounters:  04/08/22 197 lb 3.2 oz (89.4 kg)  03/24/22 196 lb 8 oz (89.1 kg)  02/11/22 195 lb 9.6 oz (88.7 kg)     She was last seen for hypertension {NUMBERS 1-12:18279} {days/wks/mos/yrs:310907} ago.  BP at that visit was ***. Management since that visit includes ***.  She reports {excellent/good/fair/poor:19665} compliance with treatment. She {is/is not:9024} having side effects. {document side effects if present:1} She is following a {diet:21022986} diet. She {is/is not:9024} exercising. She {does/does not:200015} smoke.  Use of agents associated with hypertension: {bp agents assoc with hypertension:511::"none"}.   Outside blood pressures are {***enter patient reported home BP readings, or 'not being checked':1}. Symptoms: {Yes/No:20286} chest pain {Yes/No:20286} chest pressure  {Yes/No:20286} palpitations {Yes/No:20286} syncope  {Yes/No:20286} dyspnea {Yes/No:20286} orthopnea  {Yes/No:20286} paroxysmal nocturnal dyspnea {Yes/No:20286} lower extremity edema   Pertinent labs Lab Results  Component Value Date   CHOL 177 10/16/2021   HDL 78 10/16/2021   LDLCALC 81 10/16/2021   TRIG 103 10/16/2021   CHOLHDL 2.3 10/16/2021   Lab Results  Component Value Date   NA 140 12/24/2021   K 3.7 12/24/2021   CREATININE 0.75 12/24/2021   GFRNONAA >60 12/24/2021   GLUCOSE  91 12/24/2021   TSH 2.480 07/17/2020     The 10-year ASCVD risk score (Arnett DK, et al., 2019) is: 13.3%  ---------------------------------------------------------------------------------------------------   Medications: Outpatient Medications Prior to Visit  Medication Sig   amLODipine (NORVASC) 5 MG tablet Take 1 tablet by mouth once daily   buPROPion (WELLBUTRIN XL) 150 MG 24 hr tablet Take 1 tablet (150 mg total) by mouth daily.   butalbital-acetaminophen-caffeine (FIORICET) 50-325-40 MG tablet Take 1 tablet by mouth every 6 (six) hours as needed for headache.   chlorzoxazone (PARAFON) 500 MG tablet TAKE 1 TABLET BY MOUTH EVERY 6 HOURS AS NEEDED FOR MUSCLE SPASM   gabapentin (NEURONTIN) 800 MG tablet TAKE 1 TABLET BY MOUTH THREE TIMES DAILY   loratadine (CLARITIN) 10 MG tablet Take 10 mg by mouth daily. Unsure which allergy medication is comparable to   methocarbamol (ROBAXIN) 500 MG tablet Take 1 tablet (500 mg total) by mouth every 6 (six) hours as needed (Neck pain).   methylPREDNISolone (MEDROL DOSEPAK) 4 MG TBPK tablet Take 6 pills on day 1, 5 pills on day 2, 4 pills on day 3, 3 pills on day 4, 2 pills on day 5, 1 pill on day 6   mometasone (NASONEX) 50 MCG/ACT nasal spray Place 2 sprays into the nose daily.   naproxen (NAPROSYN) 500 MG tablet Take 1 tablet (500 mg total) by mouth 2 (two) times daily with a meal.   omeprazole (PRILOSEC) 20 MG capsule Take 1 capsule by mouth once daily   rosuvastatin (CRESTOR) 10 MG tablet Take 1 tablet by mouth once daily   triamterene-hydrochlorothiazide (MAXZIDE-25) 37.5-25 MG tablet Take 1 tablet by mouth daily.  valACYclovir (VALTREX) 1000 MG tablet Take 1 tablet (1,000 mg total) by mouth 2 (two) times daily.   No facility-administered medications prior to visit.    Review of Systems  {Labs  Heme  Chem  Endocrine  Serology  Results Review (optional):23779}   Objective    There were no vitals taken for this visit. {Show previous  vital signs (optional):23777}  Physical Exam  ***  No results found for any visits on 05/22/22.  Assessment & Plan     ***  No follow-ups on file.      {provider attestation***:1}   [name], PA-C  [facility] [phone] (phone) [phone] (fax)  [place]

## 2022-05-22 ENCOUNTER — Ambulatory Visit: Payer: Medicare Other | Admitting: Physician Assistant

## 2022-05-27 ENCOUNTER — Ambulatory Visit (INDEPENDENT_AMBULATORY_CARE_PROVIDER_SITE_OTHER): Payer: Medicare Other | Admitting: Physician Assistant

## 2022-05-27 ENCOUNTER — Encounter: Payer: Self-pay | Admitting: Physician Assistant

## 2022-05-27 VITALS — BP 132/97 | HR 78 | Ht 70.0 in | Wt 205.4 lb

## 2022-05-27 DIAGNOSIS — G4700 Insomnia, unspecified: Secondary | ICD-10-CM

## 2022-05-27 DIAGNOSIS — R062 Wheezing: Secondary | ICD-10-CM

## 2022-05-27 DIAGNOSIS — Z716 Tobacco abuse counseling: Secondary | ICD-10-CM | POA: Diagnosis not present

## 2022-05-27 DIAGNOSIS — I10 Essential (primary) hypertension: Secondary | ICD-10-CM | POA: Diagnosis not present

## 2022-05-27 MED ORDER — ALBUTEROL SULFATE HFA 108 (90 BASE) MCG/ACT IN AERS: 2.0000 | INHALATION_SPRAY | Freq: Four times a day (QID) | RESPIRATORY_TRACT | 2 refills | Status: AC | PRN

## 2022-05-27 MED ORDER — HYDROCHLOROTHIAZIDE 12.5 MG PO CAPS: 12.5000 mg | ORAL_CAPSULE | Freq: Every day | ORAL | 1 refills | Status: AC

## 2022-05-27 NOTE — Assessment & Plan Note (Signed)
Continue wellbutrin 150 mg xr; discussion of increasing dose to 300 mg daily. Pt states she will try to increase the dose to see if there is a benefit. Keeping the rx at 150 mg in case she wants to resume 150 mg.

## 2022-05-27 NOTE — Progress Notes (Signed)
I,[name],acting as a Education administrator for [employer], PA-C.,have documented all relevant documentation on the behalf of [name], PA-C,as directed by  [name], PA-C while in the presence of [name], PA-C.   Established patient visit   Patient: Elizabeth Hogan   DOB: Jun 02, 1956   66 y.o. Female  MRN: [id number] Visit Date: 05/27/2022  Today's healthcare provider: [name], PA-C   Cc. Htn f/u, smoking f/u  Subjective    HPI  Hypertension, follow-up  BP Readings from Last 3 Encounters:  05/27/22 (!) 132/97  04/08/22 (!) 123/102  03/24/22 118/80   Wt Readings from Last 3 Encounters:  05/27/22 205 lb 6.4 oz (93.2 kg)  04/08/22 197 lb 3.2 oz (89.4 kg)  03/24/22 196 lb 8 oz (89.1 kg)     She was last seen for hypertension 7 weeks ago.  BP at that visit was 123/102. Management since that visit includes amlodipine 5 mg.  She reports excellent compliance with treatment. She is not having side effects.  She is following a Regular diet. She is exercising. She does smoke.  Use of agents associated with hypertension: none.   Outside blood pressures are checked occasionally. She states she takes her 'fluid' pill sporadically. Symptoms: No chest pain No chest pressure  No palpitations No syncope  No dyspnea No orthopnea  No paroxysmal nocturnal dyspnea No lower extremity edema   Pertinent labs Lab Results  Component Value Date   CHOL 177 10/16/2021   HDL 78 10/16/2021   LDLCALC 81 10/16/2021   TRIG 103 10/16/2021   CHOLHDL 2.3 10/16/2021   Lab Results  Component Value Date   NA 140 12/24/2021   K 3.7 12/24/2021   CREATININE 0.75 12/24/2021   GFRNONAA >60 12/24/2021   GLUCOSE 91 12/24/2021   TSH 2.480 07/17/2020     The 10-year ASCVD risk score (Arnett DK, et al., 2019) is: 15.6%  ---------------------------------------------------------------------------------------------------  Pt reports she has been out of the bupropion for a few  days and noticed a marked difference in her smoking habits.   Reports wheezing at night. Noticing it more since she is smoking. At its worst wheezing will come with chest tightness. Denies SOB, chest pain, wheezing during the day, new cough. She reports sometimes she will wake up holding her breath.  Medications: Outpatient Medications Prior to Visit  Medication Sig   amLODipine (NORVASC) 5 MG tablet Take 1 tablet by mouth once daily   buPROPion (WELLBUTRIN XL) 150 MG 24 hr tablet Take 1 tablet (150 mg total) by mouth daily.   butalbital-acetaminophen-caffeine (FIORICET) 50-325-40 MG tablet Take 1 tablet by mouth every 6 (six) hours as needed for headache.   chlorzoxazone (PARAFON) 500 MG tablet TAKE 1 TABLET BY MOUTH EVERY 6 HOURS AS NEEDED FOR MUSCLE SPASM   gabapentin (NEURONTIN) 800 MG tablet TAKE 1 TABLET BY MOUTH THREE TIMES DAILY   loratadine (CLARITIN) 10 MG tablet Take 10 mg by mouth daily. Unsure which allergy medication is comparable to   methocarbamol (ROBAXIN) 500 MG tablet Take 1 tablet (500 mg total) by mouth every 6 (six) hours as needed (Neck pain).   methylPREDNISolone (MEDROL DOSEPAK) 4 MG TBPK tablet Take 6 pills on day 1, 5 pills on day 2, 4 pills on day 3, 3 pills on day 4, 2 pills on day 5, 1 pill on day 6   mometasone (NASONEX) 50 MCG/ACT nasal spray Place 2 sprays into the nose daily.   naproxen (NAPROSYN) 500 MG tablet  Take 1 tablet (500 mg total) by mouth 2 (two) times daily with a meal.   omeprazole (PRILOSEC) 20 MG capsule Take 1 capsule by mouth once daily   rosuvastatin (CRESTOR) 10 MG tablet Take 1 tablet by mouth once daily   valACYclovir (VALTREX) 1000 MG tablet Take 1 tablet (1,000 mg total) by mouth 2 (two) times daily.   [DISCONTINUED] triamterene-hydrochlorothiazide (MAXZIDE-25) 37.5-25 MG tablet Take 1 tablet by mouth daily.   No facility-administered medications prior to visit.    Review of Systems Review of Systems  Constitutional:  Positive for  malaise/fatigue.  Respiratory:  Positive for wheezing. Negative for cough.   Cardiovascular:  Negative for chest pain and leg swelling.      Objective    Blood pressure (!) 132/97, pulse 78, height '5\' 10"'$  (1.778 m), weight 205 lb 6.4 oz (93.2 kg), SpO2 100 %.   Physical Exam  Physical Exam Constitutional:      Appearance: Normal appearance. She is not ill-appearing.  Cardiovascular:     Rate and Rhythm: Normal rate and regular rhythm.     Heart sounds: Normal heart sounds. No murmur heard. Pulmonary:     Effort: Pulmonary effort is normal.     Breath sounds: Normal breath sounds. No wheezing.  Neurological:     Mental Status: She is alert and oriented to person, place, and time.  Psychiatric:        Mood and Affect: Mood normal.    No results found for any visits on 05/27/22.  Assessment & Plan     Problem List Items Addressed This Visit       Cardiovascular and Mediastinum   Essential (primary) hypertension    Still elevated in office. Likely d/t sporadic use of diuretic -- but unlikely she will need that high a dose on a daily basis.  D/c maxide and start with hctz 12.5 mg daily  Order cmp  F/u 6-8 weeks       Relevant Medications   hydrochlorothiazide (MICROZIDE) 12.5 MG capsule   Other Relevant Orders   Comprehensive Metabolic Panel (CMET)     Other   Encounter for smoking cessation counseling - Primary    Continue wellbutrin 150 mg xr; discussion of increasing dose to 300 mg daily. Pt states she will try to increase the dose to see if there is a benefit. Keeping the rx at 150 mg in case she wants to resume 150 mg.       Insomnia    Suspicion for sleep apnea; smoking history, overweight; not restful sleep, wakes up holding her breath Ordered home sleep study      Relevant Orders   Ambulatory referral to Sleep Studies   Other Visit Diagnoses     Wheezing       Relevant Medications   albuterol (VENTOLIN HFA) 108 (90 Base) MCG/ACT inhaler         Return in about 8 weeks (around 07/22/2022) for hypertension.      I, [name], PA-C have reviewed all documentation for this visit. The documentation on  05/27/2022 for the exam, diagnosis, procedures, and orders are all accurate and complete.  [name], PA-C [facility] [address] [place], [place], [address] Office: [phone] Fax: [place]

## 2022-05-27 NOTE — Assessment & Plan Note (Signed)
Suspicion for sleep apnea; smoking history, overweight; not restful sleep, wakes up holding her breath Ordered home sleep study

## 2022-05-27 NOTE — Progress Notes (Deleted)
I,Sha'taria Ennis Delpozo,acting as a Education administrator for [employer], PA-C.,have documented all relevant documentation on the behalf of [name], PA-C,as directed by  [name], PA-C while in the presence of [name], PA-C.   Established patient visit   Patient: Elizabeth Hogan   DOB: 1956/03/20   66 y.o. Female  MRN: [id number] Visit Date: 05/27/2022  Today's healthcare provider: [name], PA-C   No chief complaint on file.  Subjective    HPI  -Patient made aware she can receive tetanus and shingles vaccine at her local pharmacy. Hypertension, follow-up  BP Readings from Last 3 Encounters:  04/08/22 (!) 123/102  03/24/22 118/80  02/11/22 127/87   Wt Readings from Last 3 Encounters:  04/08/22 197 lb 3.2 oz (89.4 kg)  03/24/22 196 lb 8 oz (89.1 kg)  02/11/22 195 lb 9.6 oz (88.7 kg)     She was last seen for hypertension 7 weeks ago.  BP at that visit was 123/102. Management since that visit includes amlodipine 5 mg.  She reports excellent compliance with treatment. She is not having side effects.  She is following a Regular diet. She is exercising. She does smoke.  Use of agents associated with hypertension: none.   Outside blood pressures are {checked occassionally}. Symptoms: No chest pain No chest pressure  No palpitations No syncope  No dyspnea No orthopnea  No paroxysmal nocturnal dyspnea No lower extremity edema   Pertinent labs Lab Results  Component Value Date   CHOL 177 10/16/2021   HDL 78 10/16/2021   LDLCALC 81 10/16/2021   TRIG 103 10/16/2021   CHOLHDL 2.3 10/16/2021   Lab Results  Component Value Date   NA 140 12/24/2021   K 3.7 12/24/2021   CREATININE 0.75 12/24/2021   GFRNONAA >60 12/24/2021   GLUCOSE 91 12/24/2021   TSH 2.480 07/17/2020     The 10-year ASCVD risk score (Arnett DK, et al., 2019) is: 13.3%  ---------------------------------------------------------------------------------------------------    Medications: Outpatient Medications Prior to Visit  Medication Sig   amLODipine (NORVASC) 5 MG tablet Take 1 tablet by mouth once daily   buPROPion (WELLBUTRIN XL) 150 MG 24 hr tablet Take 1 tablet (150 mg total) by mouth daily.   butalbital-acetaminophen-caffeine (FIORICET) 50-325-40 MG tablet Take 1 tablet by mouth every 6 (six) hours as needed for headache.   chlorzoxazone (PARAFON) 500 MG tablet TAKE 1 TABLET BY MOUTH EVERY 6 HOURS AS NEEDED FOR MUSCLE SPASM   gabapentin (NEURONTIN) 800 MG tablet TAKE 1 TABLET BY MOUTH THREE TIMES DAILY   loratadine (CLARITIN) 10 MG tablet Take 10 mg by mouth daily. Unsure which allergy medication is comparable to   methocarbamol (ROBAXIN) 500 MG tablet Take 1 tablet (500 mg total) by mouth every 6 (six) hours as needed (Neck pain).   methylPREDNISolone (MEDROL DOSEPAK) 4 MG TBPK tablet Take 6 pills on day 1, 5 pills on day 2, 4 pills on day 3, 3 pills on day 4, 2 pills on day 5, 1 pill on day 6   mometasone (NASONEX) 50 MCG/ACT nasal spray Place 2 sprays into the nose daily.   naproxen (NAPROSYN) 500 MG tablet Take 1 tablet (500 mg total) by mouth 2 (two) times daily with a meal.   omeprazole (PRILOSEC) 20 MG capsule Take 1 capsule by mouth once daily   rosuvastatin (CRESTOR) 10 MG tablet Take 1 tablet by mouth once daily   triamterene-hydrochlorothiazide (MAXZIDE-25) 37.5-25 MG tablet Take 1 tablet by mouth daily.   valACYclovir (  VALTREX) 1000 MG tablet Take 1 tablet (1,000 mg total) by mouth 2 (two) times daily.   No facility-administered medications prior to visit.    Review of Systems  {Labs  Heme  Chem  Endocrine  Serology  Results Review (optional):23779}   Objective    There were no vitals taken for this visit. {Show previous vital signs (optional):23777}  Physical Exam  ***  No results found for any visits on 05/27/22.  Assessment & Plan     ***  No follow-ups on file.      {provider attestation***:1}   [name], PA-C  [facility] [phone] (phone) [phone] (fax)  [place]

## 2022-05-27 NOTE — Assessment & Plan Note (Signed)
Still elevated in office. Likely d/t sporadic use of diuretic -- but unlikely she will need that high a dose on a daily basis.  D/c maxide and start with hctz 12.5 mg daily  Order cmp  F/u 6-8 weeks

## 2022-05-28 LAB — COMPREHENSIVE METABOLIC PANEL
ALT: 15 IU/L (ref 0–32)
AST: 19 IU/L (ref 0–40)
Albumin/Globulin Ratio: 1.6 (ref 1.2–2.2)
Albumin: 4.4 g/dL (ref 3.9–4.9)
Alkaline Phosphatase: 72 IU/L (ref 44–121)
BUN/Creatinine Ratio: 16 (ref 12–28)
BUN: 12 mg/dL (ref 8–27)
Bilirubin Total: 0.6 mg/dL (ref 0.0–1.2)
CO2: 26 mmol/L (ref 20–29)
Calcium: 9.8 mg/dL (ref 8.7–10.3)
Chloride: 106 mmol/L (ref 96–106)
Creatinine, Ser: 0.75 mg/dL (ref 0.57–1.00)
Globulin, Total: 2.7 g/dL (ref 1.5–4.5)
Glucose: 92 mg/dL (ref 70–99)
Potassium: 4.6 mmol/L (ref 3.5–5.2)
Sodium: 144 mmol/L (ref 134–144)
Total Protein: 7.1 g/dL (ref 6.0–8.5)
eGFR: 88 mL/min/{1.73_m2} (ref >59)

## 2022-05-28 MED ORDER — BUPROPION HCL ER (XL) 150 MG PO TB24
150.0000 mg | ORAL_TABLET | Freq: Every day | ORAL | 1 refills | Status: AC
Start: 2022-05-28 — End: ?

## 2022-05-28 NOTE — Addendum Note (Signed)
Addended by: Barnie Mort on: 05/28/2022 04:04 PM   Modules accepted: Orders

## 2022-06-03 ENCOUNTER — Other Ambulatory Visit: Payer: Self-pay | Admitting: Physician Assistant

## 2022-06-03 DIAGNOSIS — I10 Essential (primary) hypertension: Secondary | ICD-10-CM

## 2022-06-03 DIAGNOSIS — E785 Hyperlipidemia, unspecified: Secondary | ICD-10-CM

## 2022-06-11 IMAGING — MR MR MRA HEAD W/O CM
1 series · 28 of 48 positions shown · non-contrast
Comparison: Correlation made with CTA 12/24/2021

CLINICAL DATA: Possible aneurysm

EXAM:
MRA HEAD WITHOUT CONTRAST
TECHNIQUE: Angiographic images of the Circle of Willis were acquired using MRA
technique without intravenous contrast.

[Series 9: TOF · axial · 0.5mm · 0.48mm/px · z∈[-118,-11]mm · 28 of 243 slices shown]
[im 1/243]
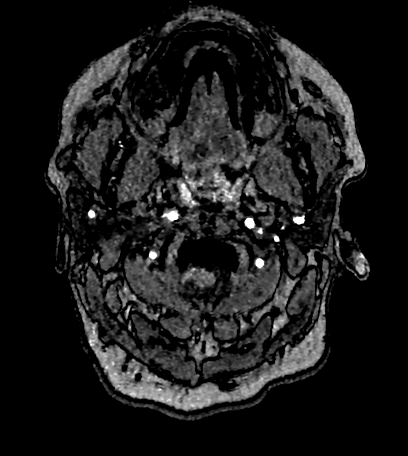
[im 6/243]
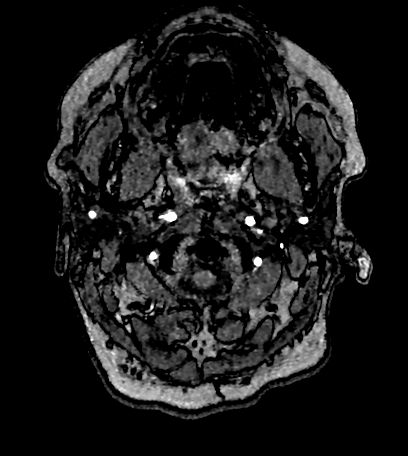
[im 11/243]
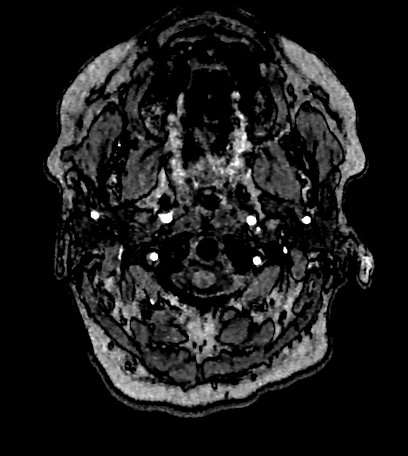
[im 16/243]
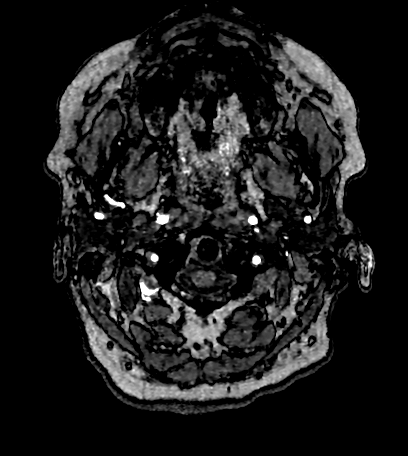
[im 21/243]
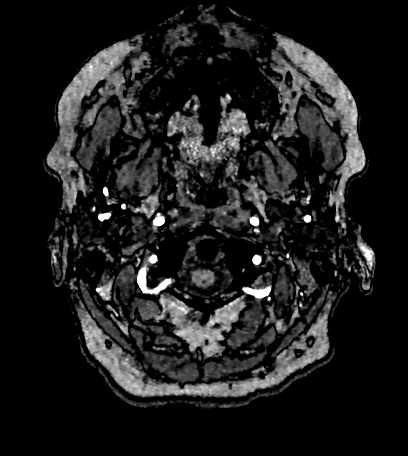
[im 26/243]
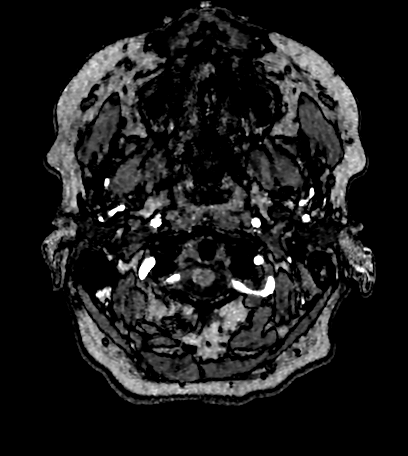
[im 31/243]
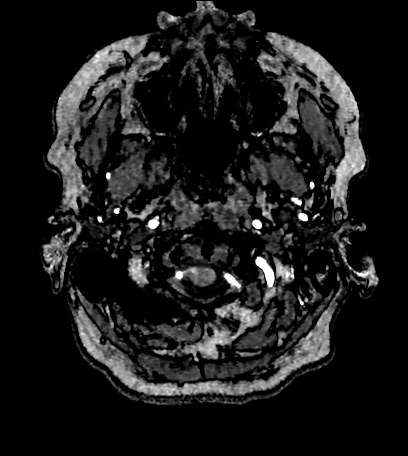
[im 37/243]
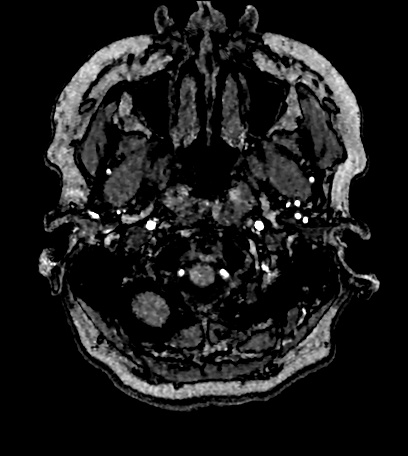
[im 42/243]
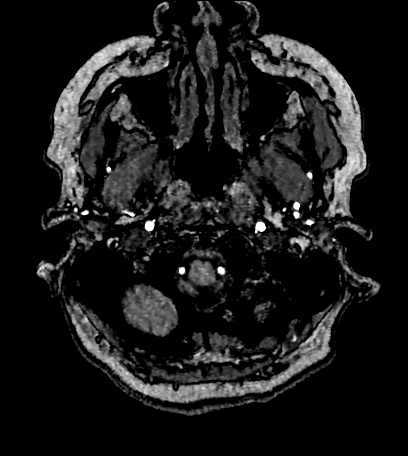
[im 47/243]
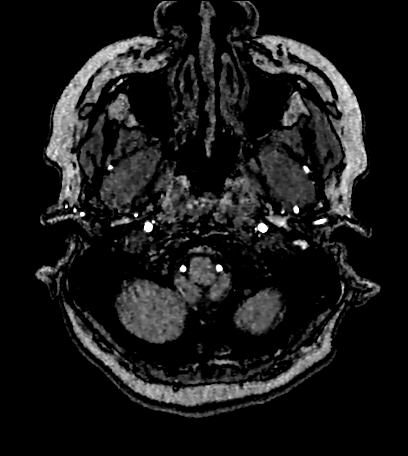
[im 52/243]
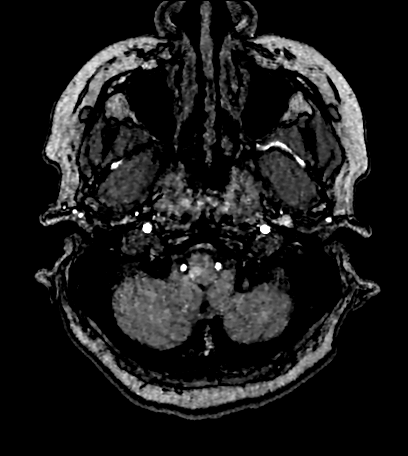
[im 57/243]
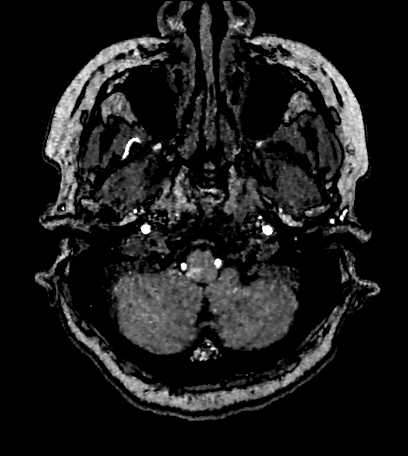
[im 62/243]
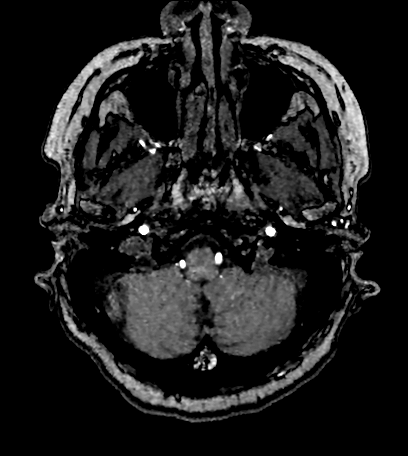
[im 67/243]
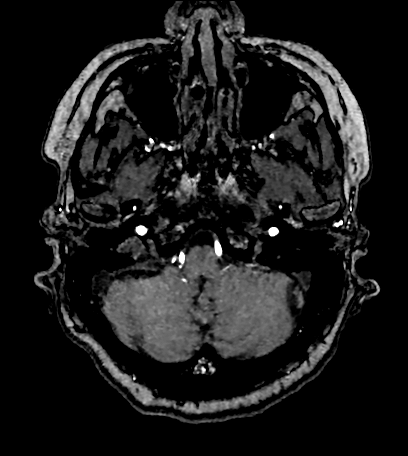
[im 73/243]
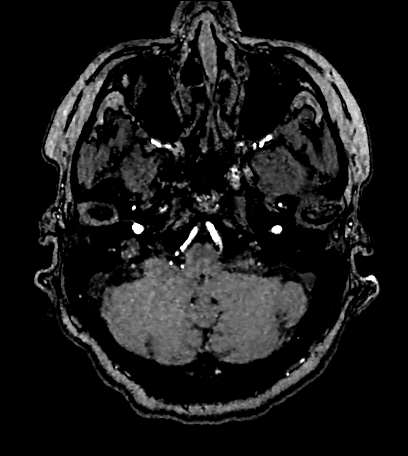
[im 78/243]
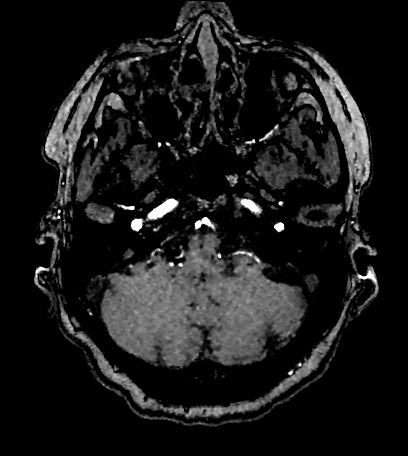
[im 83/243]
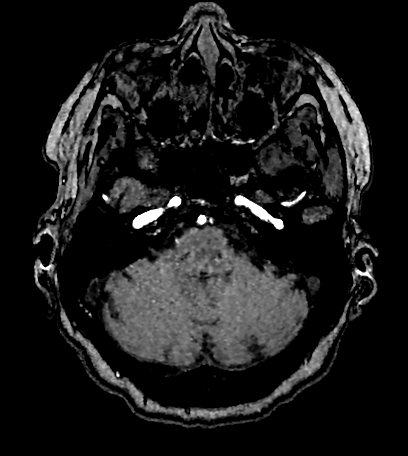
[im 88/243]
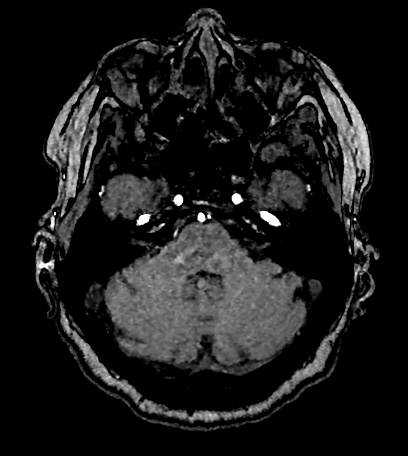
[im 93/243]
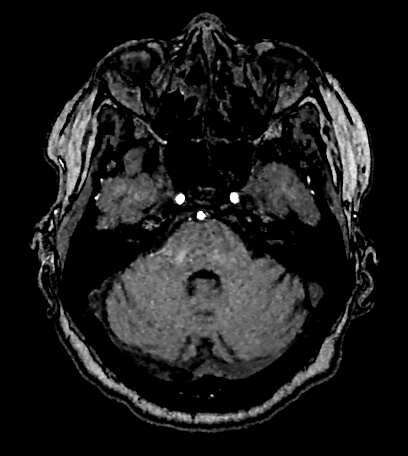
[im 98/243]
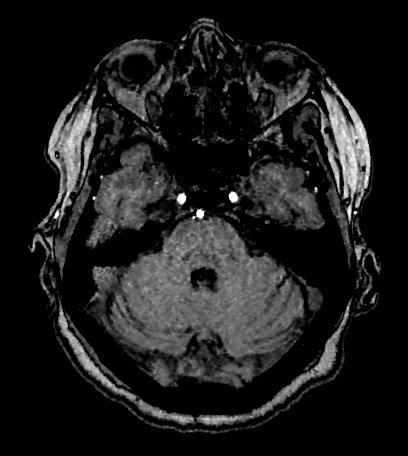
[im 103/243]
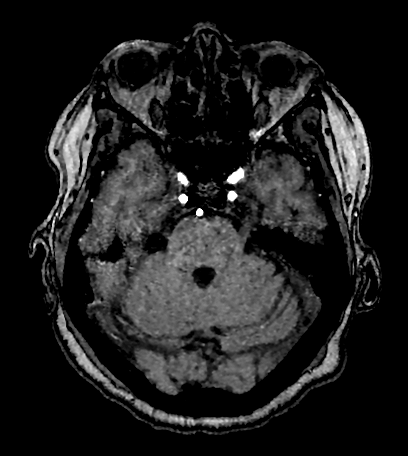
[im 109/243]
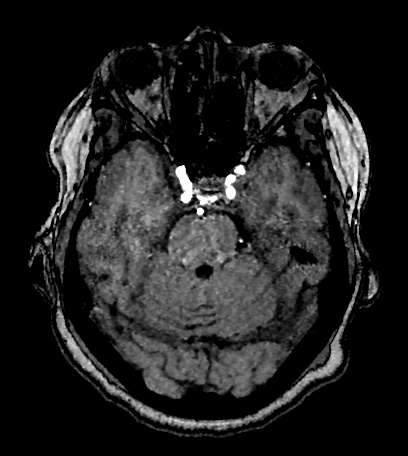
[im 124/243]
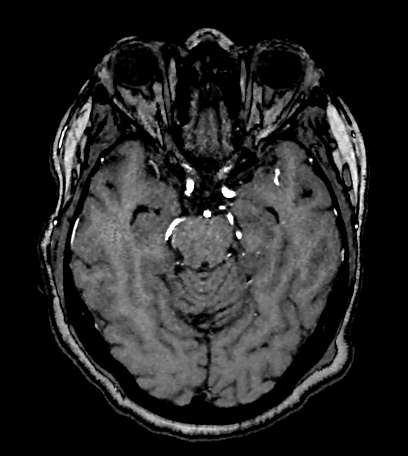
[im 140/243]
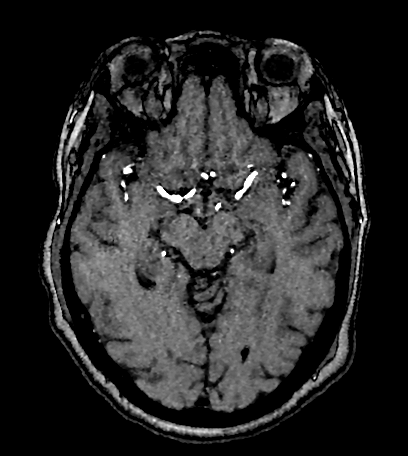
[im 170/243]
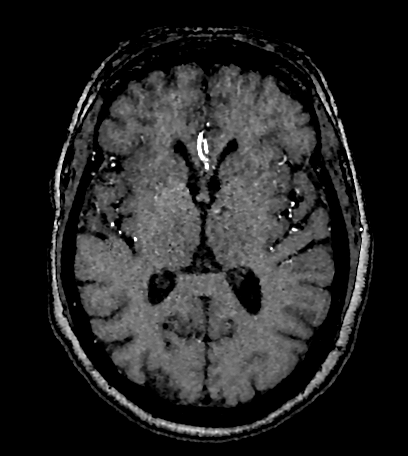
[im 201/243]
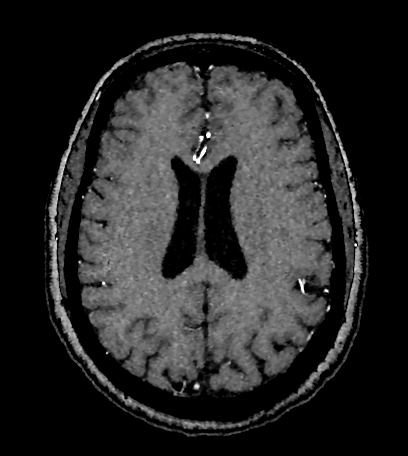
[im 206/243]
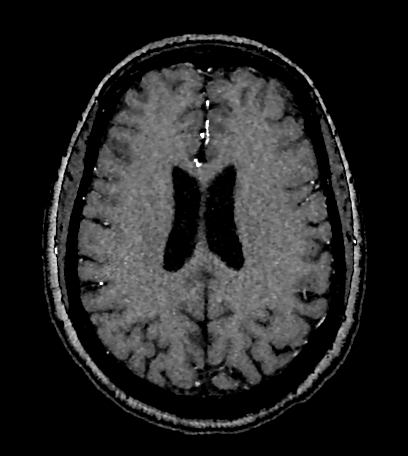
[im 232/243]
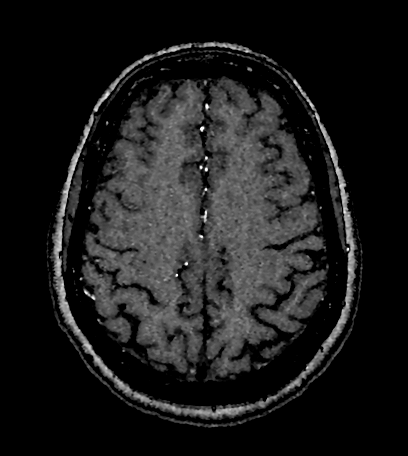

[28 of 48 positions shown; findings below may reference images not displayed]

FINDINGS: Anterior circulation: Intracranial internal carotid arteries are
patent. Possible 1 mm inferiorly directed outpouching of the
supraclinoid right ICA is unchanged. Anterior and middle cerebral
arteries are patent. Anterior communicating artery is present.

Posterior circulation: Intracranial vertebral arteries, basilar
artery, and posterior cerebral arteries are patent.
IMPRESSION: Unchanged 1 mm possible aneurysm or infundibulum of the supraclinoid
right ICA.

## 2022-06-24 ENCOUNTER — Other Ambulatory Visit: Payer: Self-pay | Admitting: Internal Medicine

## 2022-06-24 DIAGNOSIS — Z1231 Encounter for screening mammogram for malignant neoplasm of breast: Secondary | ICD-10-CM

## 2022-07-15 ENCOUNTER — Other Ambulatory Visit: Payer: Self-pay | Admitting: Family Medicine

## 2022-07-15 ENCOUNTER — Other Ambulatory Visit: Payer: Self-pay | Admitting: Physician Assistant

## 2022-07-15 DIAGNOSIS — G6289 Other specified polyneuropathies: Secondary | ICD-10-CM

## 2022-07-15 DIAGNOSIS — K219 Gastro-esophageal reflux disease without esophagitis: Secondary | ICD-10-CM

## 2022-07-15 DIAGNOSIS — G8929 Other chronic pain: Secondary | ICD-10-CM

## 2022-07-16 NOTE — Telephone Encounter (Signed)
Requested medication (s) are due for refill today - yes  Requested medication (s) are on the active medication list -yes  Future visit scheduled -no  Last refill: 05/05/22 #120  Notes to clinic: non delegated Rx  Requested Prescriptions  Pending Prescriptions Disp Refills   chlorzoxazone (PARAFON) 500 MG tablet [Pharmacy Med Name: Chlorzoxazone 500 MG Oral Tablet] 120 tablet 0    Sig: TAKE 1 TABLET BY MOUTH EVERY 6 HOURS AS NEEDED FOR MUSCLE SPASM     Not Delegated - Analgesics:  Muscle Relaxants Failed - 07/15/2022 10:46 AM      Failed - This refill cannot be delegated      Passed - Valid encounter within last 6 months    Recent Outpatient Visits           1 month ago Essential (primary) hypertension   [employer], [place], PA-C   3 months ago Acute recurrent frontal sinusitis   [employer], [place], PA-C   3 months ago Acute maxillary sinusitis, recurrence not specified   [employer], Dani [place], PA-C   5 months ago Essential (primary) hypertension   [employer], [place], PA-C   5 months ago Essential (primary) hypertension   [employer], [place], PA-C                 Requested Prescriptions  Pending Prescriptions Disp Refills   chlorzoxazone (PARAFON) 500 MG tablet [Pharmacy Med Name: Chlorzoxazone 500 MG Oral Tablet] 120 tablet 0    Sig: TAKE 1 TABLET BY MOUTH EVERY 6 HOURS AS NEEDED FOR MUSCLE SPASM     Not Delegated - Analgesics:  Muscle Relaxants Failed - 07/15/2022 10:46 AM      Failed - This refill cannot be delegated      Passed - Valid encounter within last 6 months    Recent Outpatient Visits           1 month ago Essential (primary) hypertension   [employer], [place], PA-C   3 months ago Acute recurrent frontal sinusitis   [facility], [place], PA-C   3 months ago Acute maxillary sinusitis, recurrence  not specified   [employer], [place], PA-C   5 months ago Essential (primary) hypertension   [employer], [place], PA-C   5 months ago Essential (primary) hypertension   [employer], [place], [place]

## 2022-07-20 ENCOUNTER — Ambulatory Visit: Payer: Medicare Other | Admitting: Physician Assistant

## 2022-08-14 ENCOUNTER — Other Ambulatory Visit: Payer: Self-pay

## 2022-08-14 DIAGNOSIS — I671 Cerebral aneurysm, nonruptured: Secondary | ICD-10-CM

## 2022-09-16 ENCOUNTER — Ambulatory Visit
Admission: RE | Admit: 2022-09-16 | Discharge: 2022-09-16 | Disposition: A | Discharge status: Home / Self Care | Payer: Medicare Other | Source: Ambulatory Visit | Attending: Internal Medicine | Admitting: Internal Medicine

## 2022-09-16 DIAGNOSIS — Z1231 Encounter for screening mammogram for malignant neoplasm of breast: Secondary | ICD-10-CM | POA: Diagnosis present

## 2022-10-02 ENCOUNTER — Other Ambulatory Visit: Payer: Self-pay | Admitting: Physician Assistant

## 2022-10-02 DIAGNOSIS — G8929 Other chronic pain: Secondary | ICD-10-CM

## 2022-10-26 ENCOUNTER — Ambulatory Visit
Admission: RE | Admit: 2022-10-26 | Discharge: 2022-10-26 | Disposition: A | Discharge status: Home / Self Care | Payer: Medicare Other | Source: Ambulatory Visit | Attending: Neurosurgery | Admitting: Neurosurgery

## 2022-10-26 DIAGNOSIS — I671 Cerebral aneurysm, nonruptured: Secondary | ICD-10-CM | POA: Insufficient documentation

## 2022-11-10 ENCOUNTER — Ambulatory Visit (INDEPENDENT_AMBULATORY_CARE_PROVIDER_SITE_OTHER): Payer: Medicare Other | Admitting: Neurosurgery

## 2022-11-10 DIAGNOSIS — I671 Cerebral aneurysm, nonruptured: Secondary | ICD-10-CM | POA: Diagnosis not present

## 2022-11-10 NOTE — Addendum Note (Signed)
Addended by: Meade Maw on: 11/10/2022 04:26 PM   Modules accepted: Level of Service

## 2022-11-10 NOTE — Progress Notes (Addendum)
Referring Physician:  Gladstone Lighter, MD [place],  [place] [id number]  Primary Physician:  Gladstone Lighter, MD  History of Present Illness: 11/10/2022  She is having no new symptoms.  Past Medical History: Past Medical History:  Diagnosis Date   Anxiety    GERD (gastroesophageal reflux disease)    Hyperlipidemia    Hypertension    Seizures ([place])    in the 1970's.  none since.   Wears dentures    partial upper (loose)    Past Surgical History: Past Surgical History:  Procedure Laterality Date   ABDOMINAL HYSTERECTOMY  2005   still has cervix   CATARACT EXTRACTION W/PHACO Left 05/12/2019   Procedure: CATARACT EXTRACTION PHACO AND INTRAOCULAR LENS PLACEMENT (IOC) COMPLICATED  LEFT HEALON 5, VISION BLUE, SYMFONY LENS;  Surgeon: Leandrew Koyanagi, MD;  Location: [facility];  Service: Ophthalmology;  Laterality: Left;   CATARACT EXTRACTION W/PHACO Right 06/07/2019   Procedure: CATARACT EXTRACTION PHACO AND INTRAOCULAR LENS PLACEMENT ([place])  COMPLICATED RIGHT;  Surgeon: Leandrew Koyanagi, MD;  Location: [facility];  Service: Ophthalmology;  Laterality: Right;  MALYUGIN   COLONOSCOPY WITH PROPOFOL N/A 02/11/2016   Procedure: COLONOSCOPY WITH PROPOFOL;  Surgeon: Lucilla Lame, MD;  Location: [facility];  Service: Endoscopy;  Laterality: N/A;   LAMINECTOMY  07/2010   Secondary to Job injury in 2010, Subsequent nerve damage and followed by [facility]   TOOTH EXTRACTION     TUBAL LIGATION      Allergies: Allergies as of 11/10/2022 - Review Complete 05/27/2022  Allergen Reaction Noted   Augmentin [amoxicillin-pot clavulanate] Itching 05/04/2019   Codeine Itching 11/14/2014   Codeine sulfate Itching and Rash 09/02/2015    Medications: No outpatient medications have been marked as taking for the 11/10/22 encounter (Appointment) with Meade Maw, MD.    Social History: Social History   Tobacco Use   Smoking status: Every  Day    Packs/day: 0.50    Years: 48.00    Total pack years: 24.00    Types: Cigarettes   Smokeless tobacco: Never   Tobacco comments:    pt declines quitting at this time  Vaping Use   Vaping Use: Never used  Substance Use Topics   Alcohol use: Yes    Alcohol/week: 14.0 - 21.0 standard drinks of alcohol    Types: 14 - 21 Cans of beer per week    Comment: 2-3 beers a night   Drug use: Yes    Types: Cocaine    Comment: current use.    Family Medical History: Family History  Problem Relation Age of Onset   Hypertension Mother    Arthritis Mother    Gout Mother    Diabetes Father    Alzheimer's disease Father    Hypertension Father    Hypertension Brother    Breast cancer Cousin 81       maternal side    Physical Examination: Telephone visit   Medical Decision Making  Imaging: MRI Angio Head 10/26/2022 IMPRESSION: 1 mm inferiorly projecting outpouching at the supraclinoid ICA on the right, probably atherosclerotic in nature and of doubtful significance. No change when compared to the prior imaging.     Electronically Signed   By: Nelson Chimes M.D.   On: 10/27/2022 16:13  I have personally reviewed the images and agree with the above interpretation.  Assessment and Plan: Ms. Slappey is a pleasant 67 y.o. female with vascular abnormality of the carotid artery as described above.  It  is stable on repeat imaging.  I have recommended against further imaging of this.  It is unclear whether this is a worrisome vascular abnormality and has now been imaged 3 separate times with stable findings.  If she has any new symptoms of headache, or other neurologic concerns, I am happy to order an additional scan in the future.  However, I recommended against serial imaging at this time.  This visit was performed via telephone.  Patient location: home Provider location: office  I spent a total of 5 minutes non-face-to-face activities for this visit on the date of this  encounter including review of current clinical condition and response to treatment.    Thank you for involving me in the care of this patient.      Cain Fitzhenry K. Izora Ribas MD, [facility] Neurosurgery

## 2022-11-12 ENCOUNTER — Other Ambulatory Visit: Payer: Self-pay | Admitting: Physician Assistant

## 2022-11-12 DIAGNOSIS — I10 Essential (primary) hypertension: Secondary | ICD-10-CM

## 2022-11-23 DIAGNOSIS — G4486 Cervicogenic headache: Secondary | ICD-10-CM | POA: Diagnosis not present

## 2022-11-23 DIAGNOSIS — M542 Cervicalgia: Secondary | ICD-10-CM | POA: Diagnosis not present

## 2022-11-23 DIAGNOSIS — I671 Cerebral aneurysm, nonruptured: Secondary | ICD-10-CM | POA: Diagnosis not present

## 2022-11-25 ENCOUNTER — Other Ambulatory Visit: Payer: Self-pay | Admitting: Physician Assistant

## 2022-11-25 DIAGNOSIS — M542 Cervicalgia: Secondary | ICD-10-CM

## 2022-11-25 DIAGNOSIS — G4486 Cervicogenic headache: Secondary | ICD-10-CM

## 2022-11-25 DIAGNOSIS — R519 Headache, unspecified: Secondary | ICD-10-CM

## 2022-11-25 DIAGNOSIS — I10 Essential (primary) hypertension: Secondary | ICD-10-CM

## 2022-11-30 ENCOUNTER — Other Ambulatory Visit: Payer: Self-pay | Admitting: Physician Assistant

## 2022-11-30 DIAGNOSIS — E785 Hyperlipidemia, unspecified: Secondary | ICD-10-CM

## 2022-12-02 ENCOUNTER — Ambulatory Visit
Admission: RE | Admit: 2022-12-02 | Discharge: 2022-12-02 | Disposition: A | Discharge status: Home / Self Care | Payer: Medicare Other | Source: Ambulatory Visit | Attending: Physician Assistant | Admitting: Physician Assistant

## 2022-12-02 DIAGNOSIS — R519 Headache, unspecified: Secondary | ICD-10-CM | POA: Insufficient documentation

## 2022-12-02 DIAGNOSIS — G4486 Cervicogenic headache: Secondary | ICD-10-CM

## 2022-12-02 DIAGNOSIS — M542 Cervicalgia: Secondary | ICD-10-CM

## 2022-12-15 ENCOUNTER — Other Ambulatory Visit: Payer: Self-pay | Admitting: Physician Assistant

## 2022-12-15 DIAGNOSIS — E785 Hyperlipidemia, unspecified: Secondary | ICD-10-CM

## 2022-12-25 ENCOUNTER — Other Ambulatory Visit: Payer: Self-pay | Admitting: Physician Assistant

## 2022-12-25 DIAGNOSIS — E785 Hyperlipidemia, unspecified: Secondary | ICD-10-CM

## 2023-01-04 DIAGNOSIS — I671 Cerebral aneurysm, nonruptured: Secondary | ICD-10-CM | POA: Diagnosis not present

## 2023-01-04 DIAGNOSIS — G4486 Cervicogenic headache: Secondary | ICD-10-CM | POA: Diagnosis not present

## 2023-01-04 DIAGNOSIS — M4802 Spinal stenosis, cervical region: Secondary | ICD-10-CM | POA: Diagnosis not present

## 2023-01-04 DIAGNOSIS — R519 Headache, unspecified: Secondary | ICD-10-CM | POA: Diagnosis not present

## 2023-01-14 DIAGNOSIS — Z23 Encounter for immunization: Secondary | ICD-10-CM | POA: Diagnosis not present

## 2023-02-02 DIAGNOSIS — M542 Cervicalgia: Secondary | ICD-10-CM | POA: Diagnosis not present

## 2023-02-02 DIAGNOSIS — R519 Headache, unspecified: Secondary | ICD-10-CM | POA: Diagnosis not present

## 2023-02-11 ENCOUNTER — Other Ambulatory Visit: Payer: Self-pay | Admitting: Physician Assistant

## 2023-02-11 DIAGNOSIS — I10 Essential (primary) hypertension: Secondary | ICD-10-CM

## 2023-02-19 ENCOUNTER — Other Ambulatory Visit: Payer: Self-pay | Admitting: Physician Assistant

## 2023-02-19 DIAGNOSIS — I10 Essential (primary) hypertension: Secondary | ICD-10-CM

## 2023-06-02 DIAGNOSIS — R519 Headache, unspecified: Secondary | ICD-10-CM | POA: Diagnosis not present

## 2023-06-02 DIAGNOSIS — R0602 Shortness of breath: Secondary | ICD-10-CM | POA: Diagnosis not present

## 2023-06-02 DIAGNOSIS — I10 Essential (primary) hypertension: Secondary | ICD-10-CM | POA: Diagnosis not present

## 2023-06-02 DIAGNOSIS — E782 Mixed hyperlipidemia: Secondary | ICD-10-CM | POA: Diagnosis not present

## 2023-07-20 DIAGNOSIS — R109 Unspecified abdominal pain: Secondary | ICD-10-CM | POA: Diagnosis not present

## 2023-07-20 DIAGNOSIS — M545 Low back pain, unspecified: Secondary | ICD-10-CM | POA: Diagnosis not present

## 2023-07-20 DIAGNOSIS — M546 Pain in thoracic spine: Secondary | ICD-10-CM | POA: Diagnosis not present

## 2023-07-20 DIAGNOSIS — R1012 Left upper quadrant pain: Secondary | ICD-10-CM | POA: Diagnosis not present

## 2023-07-30 DIAGNOSIS — G8929 Other chronic pain: Secondary | ICD-10-CM | POA: Diagnosis not present

## 2023-07-30 DIAGNOSIS — M5442 Lumbago with sciatica, left side: Secondary | ICD-10-CM | POA: Diagnosis not present

## 2023-07-30 DIAGNOSIS — F17210 Nicotine dependence, cigarettes, uncomplicated: Secondary | ICD-10-CM | POA: Diagnosis not present

## 2023-07-30 DIAGNOSIS — I671 Cerebral aneurysm, nonruptured: Secondary | ICD-10-CM | POA: Diagnosis not present

## 2023-07-30 DIAGNOSIS — G4486 Cervicogenic headache: Secondary | ICD-10-CM | POA: Diagnosis not present

## 2023-07-30 DIAGNOSIS — R519 Headache, unspecified: Secondary | ICD-10-CM | POA: Diagnosis not present

## 2023-07-30 DIAGNOSIS — I10 Essential (primary) hypertension: Secondary | ICD-10-CM | POA: Diagnosis not present

## 2023-07-30 DIAGNOSIS — M47816 Spondylosis without myelopathy or radiculopathy, lumbar region: Secondary | ICD-10-CM | POA: Diagnosis not present

## 2023-08-04 ENCOUNTER — Other Ambulatory Visit: Payer: Self-pay | Admitting: Internal Medicine

## 2023-08-04 DIAGNOSIS — G8929 Other chronic pain: Secondary | ICD-10-CM

## 2023-08-10 ENCOUNTER — Other Ambulatory Visit: Payer: Self-pay | Admitting: Physician Assistant

## 2023-08-10 DIAGNOSIS — I10 Essential (primary) hypertension: Secondary | ICD-10-CM

## 2023-08-11 ENCOUNTER — Ambulatory Visit
Admission: RE | Admit: 2023-08-11 | Discharge: 2023-08-11 | Disposition: A | Discharge status: Home / Self Care | Payer: Medicare Other | Source: Ambulatory Visit | Attending: Internal Medicine | Admitting: Internal Medicine

## 2023-08-11 DIAGNOSIS — M5442 Lumbago with sciatica, left side: Secondary | ICD-10-CM | POA: Diagnosis not present

## 2023-08-11 DIAGNOSIS — G8929 Other chronic pain: Secondary | ICD-10-CM | POA: Diagnosis not present

## 2023-08-11 DIAGNOSIS — M5136 Other intervertebral disc degeneration, lumbar region with discogenic back pain only: Secondary | ICD-10-CM | POA: Diagnosis not present

## 2023-08-11 DIAGNOSIS — M48061 Spinal stenosis, lumbar region without neurogenic claudication: Secondary | ICD-10-CM | POA: Diagnosis not present

## 2023-08-24 DIAGNOSIS — G8929 Other chronic pain: Secondary | ICD-10-CM | POA: Diagnosis not present

## 2023-08-24 DIAGNOSIS — M5442 Lumbago with sciatica, left side: Secondary | ICD-10-CM | POA: Diagnosis not present

## 2023-09-13 DIAGNOSIS — K5900 Constipation, unspecified: Secondary | ICD-10-CM | POA: Diagnosis not present

## 2023-10-13 ENCOUNTER — Other Ambulatory Visit: Payer: Self-pay | Admitting: Physician Assistant

## 2023-10-13 ENCOUNTER — Ambulatory Visit
Admission: RE | Admit: 2023-10-13 | Discharge: 2023-10-13 | Disposition: A | Discharge status: Home / Self Care | Payer: Medicare Other | Source: Ambulatory Visit | Attending: Physician Assistant | Admitting: Physician Assistant

## 2023-10-13 DIAGNOSIS — K5900 Constipation, unspecified: Secondary | ICD-10-CM | POA: Diagnosis not present

## 2023-10-13 DIAGNOSIS — M79605 Pain in left leg: Secondary | ICD-10-CM | POA: Diagnosis not present

## 2023-10-13 DIAGNOSIS — I700 Atherosclerosis of aorta: Secondary | ICD-10-CM | POA: Diagnosis not present

## 2023-10-13 DIAGNOSIS — M7989 Other specified soft tissue disorders: Secondary | ICD-10-CM | POA: Insufficient documentation

## 2023-10-13 DIAGNOSIS — J40 Bronchitis, not specified as acute or chronic: Secondary | ICD-10-CM | POA: Diagnosis not present

## 2023-10-13 DIAGNOSIS — R600 Localized edema: Secondary | ICD-10-CM | POA: Diagnosis not present

## 2023-10-13 DIAGNOSIS — R059 Cough, unspecified: Secondary | ICD-10-CM | POA: Diagnosis not present

## 2024-09-20 ENCOUNTER — Other Ambulatory Visit: Payer: Self-pay | Admitting: Internal Medicine

## 2024-09-20 DIAGNOSIS — M7989 Other specified soft tissue disorders: Secondary | ICD-10-CM

## 2024-09-21 ENCOUNTER — Ambulatory Visit
Admission: RE | Admit: 2024-09-21 | Discharge: 2024-09-21 | Disposition: A | Discharge status: Home / Self Care | Source: Ambulatory Visit | Attending: Internal Medicine | Admitting: Internal Medicine

## 2024-09-21 DIAGNOSIS — M7989 Other specified soft tissue disorders: Secondary | ICD-10-CM | POA: Diagnosis present

## 2024-10-18 ENCOUNTER — Emergency Department

## 2024-10-18 ENCOUNTER — Emergency Department
Admission: EM | Admit: 2024-10-18 | Discharge: 2024-10-18 | Disposition: A | Discharge status: Home / Self Care | Source: Ambulatory Visit

## 2024-10-18 DIAGNOSIS — R079 Chest pain, unspecified: Secondary | ICD-10-CM

## 2024-10-18 DIAGNOSIS — I10 Essential (primary) hypertension: Secondary | ICD-10-CM | POA: Diagnosis not present

## 2024-10-18 DIAGNOSIS — R519 Headache, unspecified: Secondary | ICD-10-CM

## 2024-10-18 DIAGNOSIS — I119 Hypertensive heart disease without heart failure: Secondary | ICD-10-CM | POA: Insufficient documentation

## 2024-10-18 DIAGNOSIS — R9082 White matter disease, unspecified: Secondary | ICD-10-CM | POA: Insufficient documentation

## 2024-10-18 DIAGNOSIS — I159 Secondary hypertension, unspecified: Secondary | ICD-10-CM | POA: Insufficient documentation

## 2024-10-18 DIAGNOSIS — Z79899 Other long term (current) drug therapy: Secondary | ICD-10-CM | POA: Insufficient documentation

## 2024-10-18 DIAGNOSIS — R0789 Other chest pain: Secondary | ICD-10-CM | POA: Insufficient documentation

## 2024-10-18 LAB — TROPONIN T, HIGH SENSITIVITY: Troponin T High Sensitivity: <15 ng/L (ref 0–19)

## 2024-10-18 LAB — URINALYSIS, ROUTINE W REFLEX MICROSCOPIC
Bacteria, UA: NONE SEEN
Bilirubin Urine: NEGATIVE
Glucose, UA: NEGATIVE mg/dL
Hgb urine dipstick: NEGATIVE
Ketones, ur: NEGATIVE mg/dL
Leukocytes,Ua: NEGATIVE
Nitrite: NEGATIVE
Protein, ur: 30 mg/dL — AB
RBC / HPF: 0 RBC/hpf (ref 0–5)
Specific Gravity, Urine: 1.005 (ref 1.005–1.030)
pH: 5 (ref 5.0–8.0)

## 2024-10-18 LAB — RESP PANEL BY RT-PCR (RSV, FLU A&B, COVID)  RVPGX2
Influenza A by PCR: NEGATIVE
Influenza B by PCR: NEGATIVE
Resp Syncytial Virus by PCR: NEGATIVE
SARS Coronavirus 2 by RT PCR: NEGATIVE

## 2024-10-18 LAB — COMPREHENSIVE METABOLIC PANEL WITH GFR
ALT: 12 U/L (ref 0–44)
AST: 26 U/L (ref 15–41)
Albumin: 4.6 g/dL (ref 3.5–5.0)
Alkaline Phosphatase: 98 U/L (ref 38–126)
Anion gap: 14 (ref 5–15)
BUN: 14 mg/dL (ref 8–23)
CO2: 27 mmol/L (ref 22–32)
Calcium: 9.6 mg/dL (ref 8.9–10.3)
Chloride: 96 mmol/L — ABNORMAL LOW (ref 98–111)
Creatinine, Ser: 0.88 mg/dL (ref 0.44–1.00)
GFR, Estimated: >60 mL/min (ref >60)
Glucose, Bld: 123 mg/dL — ABNORMAL HIGH (ref 70–99)
Potassium: 3.3 mmol/L — ABNORMAL LOW (ref 3.5–5.1)
Sodium: 136 mmol/L (ref 135–145)
Total Bilirubin: 0.5 mg/dL (ref 0.0–1.2)
Total Protein: 7.9 g/dL (ref 6.5–8.1)

## 2024-10-18 LAB — CBC
HCT: 40.7 % (ref 36.0–46.0)
Hemoglobin: 13.3 g/dL (ref 12.0–15.0)
MCH: 28.6 pg (ref 26.0–34.0)
MCHC: 32.7 g/dL (ref 30.0–36.0)
MCV: 87.5 fL (ref 80.0–100.0)
Platelets: 288 K/uL (ref 150–400)
RBC: 4.65 MIL/uL (ref 3.87–5.11)
RDW: 15.9 % — ABNORMAL HIGH (ref 11.5–15.5)
WBC: 5 K/uL (ref 4.0–10.5)
nRBC: 0 % (ref 0.0–0.2)

## 2024-10-18 MED ORDER — CLONIDINE HCL 0.1 MG PO TABS: 0.1000 mg | ORAL_TABLET | Freq: Three times a day (TID) | ORAL | 0 refills | Status: AC | PRN

## 2024-10-18 MED ORDER — POTASSIUM CHLORIDE CRYS ER 20 MEQ PO TBCR
40.0000 meq | EXTENDED_RELEASE_TABLET | Freq: Once | ORAL | Status: AC
  Administered 2024-10-18: 40 meq via ORAL
  Filled 2024-10-18: qty 2

## 2024-10-18 NOTE — ED Triage Notes (Signed)
 Pt presents to the ED via POV from home with hypertension since last week. Pt reports that she has a hx of same and has taken BP medications as prescribed. Pt reports headache. Pt reports feeling foggy headed and a little dizzy. Pt A&Ox4.

## 2024-10-18 NOTE — ED Provider Notes (Signed)
 [facility] Provider Note    Event Date/Time   First MD Initiated Contact with Patient 10/18/24 1536     (approximate)   History   Hypertension   HPI  Elizabeth Hogan is a 68 y.o. female with a past medical history of hypertension on amlodipine  and hydrochlorothiazide , anxiety, fifth no palsy, hyperlipidemia GERD, peripheral neuropathy prior history of cocaine abuse, and ongoing smoking who presents with 1 month of intermittent headaches, chest pain and elevated blood pressures.  Patient reports that she has been taking her blood pressure as prescribed but has noticed that whole blood pressure at home has gotten as high as systolic 196/110.  Denies any falls, hearing or vision changes neck pain but does sometimes demonstrate an aching in her head and intermittent chest pain.  Denies any shortness of breath or chest pain currently.  Has no nausea or vomiting.  She called her primary care physician and was told to come to the emergency department for further evaluation.  She presents with her friend who contributes to the history.      Physical Exam   Triage Vital Signs: ED Triage Vitals  Encounter Vitals Group     BP 10/18/24 1505 (!) 150/94     Girls Systolic BP Percentile --      Girls Diastolic BP Percentile --      Boys Systolic BP Percentile --      Boys Diastolic BP Percentile --      Pulse Rate 10/18/24 1505 94     Resp 10/18/24 1505 18     Temp 10/18/24 1505 97.7 F (36.5 C)     Temp Source 10/18/24 1505 Oral     SpO2 10/18/24 1505 93 %     Weight 10/18/24 1506 214 lb (97.1 kg)     Height 10/18/24 1506 5' 9.5 (1.765 m)     Head Circumference --      Peak Flow --      Pain Score 10/18/24 1506 2     Pain Loc --      Pain Education --      Exclude from Growth Chart --     Most recent vital signs: Vitals:   10/18/24 1505  BP: (!) 150/94  Pulse: 94  Resp: 18  Temp: 97.7 F (36.5 C)  SpO2: 93%    Nursing Triage Note reviewed. Vital  signs reviewed and patients oxygen saturation is normoxic  General: Patient is well nourished, well developed, awake and alert, resting comfortably in no acute distress Head: Normocephalic and atraumatic Eyes: Normal inspection, extraocular muscles intact, no conjunctival pallor Ear, nose, throat: Normal external exam Neck: Normal range of motion Respiratory: Patient is in no respiratory distress, lungs CTAB Cardiovascular: Patient is not tachycardic, RRR without murmur appreciated GI: Abd SNT with no guarding or rebound  Back: Normal inspection of the back with good strength and range of motion throughout all ext Extremities: pulses intact with good cap refills, no LE pitting edema or calf tenderness Neuro: The patient is alert and oriented to person, place, and time, appropriately conversive, with 5/5 bilat UE/LE strength, no gross motor or sensory defects noted. Coordination appears to be adequate. Skin: Warm, dry, and intact Psych: normal mood and affect, no SI or HI  ED Results / Procedures / Treatments   Labs (all labs ordered are listed, but only abnormal results are displayed) Labs Reviewed  COMPREHENSIVE METABOLIC PANEL WITH GFR - Abnormal; Notable for the following components:  Result Value   Potassium 3.3 (*)    Chloride 96 (*)    Glucose, Bld 123 (*)    All other components within normal limits  CBC - Abnormal; Notable for the following components:   RDW 15.9 (*)    All other components within normal limits  URINALYSIS, ROUTINE W REFLEX MICROSCOPIC - Abnormal; Notable for the following components:   Color, Urine STRAW (*)    APPearance CLEAR (*)    Protein, ur 30 (*)    All other components within normal limits  RESP PANEL BY RT-PCR (RSV, FLU A&B, COVID)  [id number]  TROPONIN T, HIGH SENSITIVITY     EKG EKG and rhythm strip are interpreted by myself:   EKG: [Normal sinus rhythm] at heart rate of 96, normal QRS duration, QTc 449, nonspecific ST segments and T  waves no ectopy EKG not consistent with Acute STEMI Rhythm strip: NSR in lead II   RADIOLOGY CT head: No acute abnormality on my independent review interpretation radiologist agrees Chest x-ray: No acute abnormality on my independent review interpretation radiologist agrees    PROCEDURES:  Critical Care performed: No  Procedures   MEDICATIONS ORDERED IN ED: Medications  potassium chloride  SA (KLOR-CON  M) CR tablet 40 mEq (40 mEq Oral Given 10/18/24 1550)     IMPRESSION / MDM / ASSESSMENT AND PLAN / ED COURSE                                Differential diagnosis includes, but is not limited to, hypertensive crisis, intracranial hemorrhage, pleural edema, acute renal insufficiency, electrolyte derangements, URI, UTI  ED course: Patient presents and her EKG demonstrates no evidence of acute ischemia.  Despite 1 month of symptoms her troponin which was high-sensitivity was not elevated.  She had no acute renal insufficiency with a creatinine of 0.88.  Her potassium was borderline low and this was repleted.  She had no anemia or leukocytosis.  On exam she has no new focal neurological deficits and a CT head was unremarkable.  Chest x-ray was unremarkable as well.  Patient was counseled that her workup today was reassuring.  She was taught how to take her blood pressure.  She was given a small prescription of clonidine should her blood pressure remain elevated at home before she can follow-up with her primary care doctor.  She will return with any acutely worsening symptoms.  Given her intermittent chest pain and her problems with blood pressure management I placed a cardiology referral as well   Clinical Course as of 10/18/24 2248  Wed Oct 18, 2024  1540 BP(!): 150/94 Slightly elevated [HD]  1540 Potassium(!): 3.3 Will replete and give a dose of potassium [HD]  1716 CT Head Wo Contrast No acute abnormalities [HD]  1716 DG Chest 2 View No acute abnormalities [HD]  1716 Troponin  T High Sensitivity: <15 Not elevated [HD]  1716 Creatinine: 0.88 No AKI [HD]  1743 I reviewed the workup with the patient and her friend at bedside and all voiced understanding.  They felt reassured.  They will follow-up with their primary care physician.  Will send the patient home with a as needed dose of clonidine that she can take if her blood pressure becomes elevated greater than 185 systolic or 105 diastolic.  All questions answered patient voiced understanding and requested discharge [HD]    Clinical Course User Index [HD] Nicholaus Rolland BRAVO, MD   At time  of discharge there is no evidence of acute life, limb, vision, or fertility threat. Patient has stable vital signs, pain is well controlled, patient is ambulatory and p.o. tolerant.  Discharge instructions were completed using the EPIC system. I would refer you to those at this time. All warnings prescriptions follow-up etc. were discussed in detail with the patient. Patient indicates understanding and is agreeable with this plan. All questions answered.  Patient is made aware that they may return to the emergency department for any worsening or new condition or for any other emergency.  -- Risk: 5 This patient has a high risk of morbidity due to further diagnostic testing or treatment. Rationale: This patients evaluation and management involve a high risk of morbidity due to the potential severity of presenting symptoms, need for diagnostic testing, and/or initiation of treatment that may require close monitoring. The differential includes conditions with potential for significant deterioration or requiring escalation of care. Treatment decisions in the ED, including medication administration, procedural interventions, or disposition planning, reflect this level of risk. COPA: 5 The patient has the following acute or chronic illness/injury that poses a possible threat to life or bodily function: [X] : The patient has a potentially serious  acute condition or an acute exacerbation of a chronic illness requiring urgent evaluation and management in the Emergency Department. The clinical presentation necessitates immediate consideration of life-threatening or function-threatening diagnoses, even if they are ultimately ruled out.   FINAL CLINICAL IMPRESSION(S) / ED DIAGNOSES   Final diagnoses:  Secondary hypertension  Nonintractable headache, unspecified chronicity pattern, unspecified headache type  Chest pain, unspecified type     Rx / DC Orders   ED Discharge Orders          Ordered    cloNIDine (CATAPRES) 0.1 MG tablet  3 times daily PRN        10/18/24 1746    Ambulatory referral to Cardiology       Comments: If you have not heard from the Cardiology office within the next 72 hours please call [phone].   10/18/24 1746             Note:  This document was prepared using Dragon voice recognition software and may include unintentional dictation errors.   Nicholaus Rolland BRAVO, MD 10/18/24 [phone]

## 2024-10-18 NOTE — Discharge Instructions (Signed)
 I have sent a prescription for clonidine for you to use as needed 3 times a day for systolic blood pressure greater than 185 and a diastolic blood pressure greater than 105 if that does not resolve after laying down for 20 minutes.  Please call your primary care physician tomorrow. -- RETURN PRECAUTIONS & AFTERCARE: (ENGLISH) RETURN PRECAUTIONS: Return immediately to the emergency department or see/call your doctor if you feel worse, weak or have changes in speech or vision, are short of breath, have fever, vomiting, pain, bleeding or dark stool, trouble urinating or any new issues. Return here or see/call your doctor if not improving as expected for your suspected condition. FOLLOW-UP CARE: Call your doctor and/or any doctors we referred you to for more advice and to make an appointment. Do this today, tomorrow or after the weekend. Some doctors only take PPO insurance so if you have HMO insurance you may want to contact your HMO or your regular doctor for referral to a specialist within your plan. Either way tell the doctor's office that it was a referral from the emergency department so you get the soonest possible appointment.  YOUR TEST RESULTS: Take result reports of any blood or urine tests, imaging tests and EKG's to your doctor and any referral doctor. Have any abnormal tests repeated. Your doctor or a referral doctor can let you know when this should be done. Also make sure your doctor contacts this hospital to get any test results that are not currently available such as cultures or special tests for infection and final imaging reports, which are often not available at the time you leave the ER but which may list additional important findings that are not documented on the preliminary report. BLOOD PRESSURE: If your blood pressure was greater than 120/80 have your blood pressure rechecked within 1 to 2 weeks. MEDICATION SIDE EFFECTS: Do not drive, walk, bike, take the bus, etc. if you have received  or are being prescribed any sedating medications such as those for pain or anxiety or certain antihistamines like Benadryl . If you have been give one of these here get a taxi home or have a friend drive you home. Ask your pharmacist to counsel you on potential side effects of any new medication

## 2024-10-26 ENCOUNTER — Ambulatory Visit: Attending: Cardiovascular Disease | Admitting: Cardiovascular Disease

## 2024-11-22 ENCOUNTER — Other Ambulatory Visit: Payer: Self-pay | Admitting: Internal Medicine

## 2024-11-22 DIAGNOSIS — Z1231 Encounter for screening mammogram for malignant neoplasm of breast: Secondary | ICD-10-CM
